# Patient Record
Sex: Female | Born: 1995 | Race: White | Hispanic: No | Marital: Single | Smoking: Never smoker
Health system: Southern US, Community
[De-identification: ages and names within clinical notes are randomized; demographics above are authoritative.]

## PROBLEM LIST (undated history)

## (undated) DIAGNOSIS — F329 Major depressive disorder, single episode, unspecified: Secondary | ICD-10-CM

## (undated) DIAGNOSIS — F32A Depression, unspecified: Secondary | ICD-10-CM

## (undated) DIAGNOSIS — F419 Anxiety disorder, unspecified: Secondary | ICD-10-CM

## (undated) DIAGNOSIS — F319 Bipolar disorder, unspecified: Secondary | ICD-10-CM

## (undated) DIAGNOSIS — R519 Headache, unspecified: Secondary | ICD-10-CM

## (undated) DIAGNOSIS — R51 Headache: Secondary | ICD-10-CM

## (undated) HISTORY — DX: Anxiety disorder, unspecified: F41.9

## (undated) HISTORY — DX: Bipolar disorder, unspecified: F31.9

## (undated) HISTORY — PX: SALIVARY STONE REMOVAL: SHX5213

## (undated) HISTORY — PX: WISDOM TOOTH EXTRACTION: SHX21

## (undated) HISTORY — DX: Headache: R51

## (undated) HISTORY — DX: Headache, unspecified: R51.9

## (undated) HISTORY — DX: Major depressive disorder, single episode, unspecified: F32.9

## (undated) HISTORY — DX: Depression, unspecified: F32.A

## (undated) HISTORY — PX: GUM SURGERY: SHX658

---
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
## 2015-01-15 ENCOUNTER — Ambulatory Visit (INDEPENDENT_AMBULATORY_CARE_PROVIDER_SITE_OTHER): Payer: No Typology Code available for payment source

## 2015-01-15 DIAGNOSIS — Z3042 Encounter for surveillance of injectable contraceptive: Secondary | ICD-10-CM

## 2015-01-15 DIAGNOSIS — Z3049 Encounter for surveillance of other contraceptives: Secondary | ICD-10-CM | POA: Diagnosis not present

## 2015-01-15 MED ORDER — MEDROXYPROGESTERONE ACETATE 150 MG/ML IM SUSP
150.0000 mg | Freq: Once | INTRAMUSCULAR | Status: AC
  Administered 2015-01-15: 150 mg via INTRAMUSCULAR

## 2015-04-08 ENCOUNTER — Ambulatory Visit (INDEPENDENT_AMBULATORY_CARE_PROVIDER_SITE_OTHER): Payer: BLUE CROSS/BLUE SHIELD

## 2015-04-08 DIAGNOSIS — Z3009 Encounter for other general counseling and advice on contraception: Secondary | ICD-10-CM

## 2015-04-08 MED ORDER — MEDROXYPROGESTERONE ACETATE 150 MG/ML IM SUSP
150.0000 mg | Freq: Once | INTRAMUSCULAR | Status: AC
  Administered 2015-04-08: 150 mg via INTRAMUSCULAR

## 2015-05-07 DIAGNOSIS — F419 Anxiety disorder, unspecified: Secondary | ICD-10-CM | POA: Insufficient documentation

## 2015-05-07 DIAGNOSIS — F329 Major depressive disorder, single episode, unspecified: Secondary | ICD-10-CM | POA: Insufficient documentation

## 2015-05-07 DIAGNOSIS — F314 Bipolar disorder, current episode depressed, severe, without psychotic features: Secondary | ICD-10-CM | POA: Insufficient documentation

## 2015-07-01 ENCOUNTER — Encounter: Payer: BLUE CROSS/BLUE SHIELD | Admitting: Family Medicine

## 2015-07-01 ENCOUNTER — Ambulatory Visit: Payer: BLUE CROSS/BLUE SHIELD

## 2015-07-31 ENCOUNTER — Encounter: Payer: Self-pay | Admitting: Family Medicine

## 2015-07-31 ENCOUNTER — Ambulatory Visit (INDEPENDENT_AMBULATORY_CARE_PROVIDER_SITE_OTHER): Payer: BLUE CROSS/BLUE SHIELD | Admitting: Family Medicine

## 2015-07-31 VITALS — BP 109/77 | HR 98 | Temp 97.9°F | Ht 62.0 in | Wt 104.0 lb

## 2015-07-31 DIAGNOSIS — Z308 Encounter for other contraceptive management: Secondary | ICD-10-CM | POA: Diagnosis not present

## 2015-07-31 DIAGNOSIS — Z23 Encounter for immunization: Secondary | ICD-10-CM | POA: Diagnosis not present

## 2015-07-31 DIAGNOSIS — Z3042 Encounter for surveillance of injectable contraceptive: Secondary | ICD-10-CM

## 2015-07-31 DIAGNOSIS — Z0000 Encounter for general adult medical examination without abnormal findings: Secondary | ICD-10-CM

## 2015-07-31 DIAGNOSIS — Z114 Encounter for screening for human immunodeficiency virus [HIV]: Secondary | ICD-10-CM

## 2015-07-31 LAB — PREGNANCY, URINE: PREG TEST UR: NEGATIVE

## 2015-07-31 MED ORDER — MEDROXYPROGESTERONE ACETATE 150 MG/ML IM SUSP
150.0000 mg | Freq: Once | INTRAMUSCULAR | Status: AC
  Administered 2015-07-31: 150 mg via INTRAMUSCULAR

## 2015-07-31 NOTE — Assessment & Plan Note (Signed)
Overdue for injection, so pregnancy test done today; negative; Depo 150 mg given IM times one here; she may return in 13 weeks (I will not be here at that time, so new orders will need to be entered by provider)

## 2015-07-31 NOTE — Assessment & Plan Note (Signed)
USPSTF grade A and B recommendations reviewed with patient; age-appropriate recommendations, preventive care, screening tests, etc discussed and encouraged; healthy living encouraged; see AVS for patient education given to patient; pap smear at age 20; she'll return for labs this week or next; she had a commitment this morning and had limited tiime

## 2015-07-31 NOTE — Patient Instructions (Addendum)
Check with your family to see if any strong history of breast and/or ovarian cancer  You received the flu shot today; it should protect you against the flu virus over the coming months; it will take about two weeks for antibodies to develop; do try to stay away from hospitals, nursing homes, and daycares during peak flu season; taking extra vitamin C daily during flu season may help you avoid getting sick  You received the DEPO shot today; return in 13 weeks for your next injection  Return in the next week or two for fasting labs  Return in one year for your next physical   Health Maintenance, Female Adopting a healthy lifestyle and getting preventive care can go a long way to promote health and wellness. Talk with your health care provider about what schedule of regular examinations is right for you. This is a good chance for you to check in with your provider about disease prevention and staying healthy. In between checkups, there are plenty of things you can do on your own. Experts have done a lot of research about which lifestyle changes and preventive measures are most likely to keep you healthy. Ask your health care provider for more information. WEIGHT AND DIET  Eat a healthy diet  Be sure to include plenty of vegetables, fruits, low-fat dairy products, and lean protein.  Do not eat a lot of foods high in solid fats, added sugars, or salt.  Get regular exercise. This is one of the most important things you can do for your health.  Most adults should exercise for at least 150 minutes each week. The exercise should increase your heart rate and make you sweat (moderate-intensity exercise).  Most adults should also do strengthening exercises at least twice a week. This is in addition to the moderate-intensity exercise.  Maintain a healthy weight  Body mass index (BMI) is a measurement that can be used to identify possible weight problems. It estimates body fat based on height and  weight. Your health care provider can help determine your BMI and help you achieve or maintain a healthy weight.  For females 55 years of age and older:   A BMI below 18.5 is considered underweight.  A BMI of 18.5 to 24.9 is normal.  A BMI of 25 to 29.9 is considered overweight.  A BMI of 30 and above is considered obese.  Watch levels of cholesterol and blood lipids  You should start having your blood tested for lipids and cholesterol at 20 years of age, then have this test every 5 years.  You may need to have your cholesterol levels checked more often if:  Your lipid or cholesterol levels are high.  You are older than 20 years of age.  You are at high risk for heart disease.  CANCER SCREENING   Lung Cancer  Lung cancer screening is recommended for adults 59-18 years old who are at high risk for lung cancer because of a history of smoking.  A yearly low-dose CT scan of the lungs is recommended for people who:  Currently smoke.  Have quit within the past 15 years.  Have at least a 30-pack-year history of smoking. A pack year is smoking an average of one pack of cigarettes a day for 1 year.  Yearly screening should continue until it has been 15 years since you quit.  Yearly screening should stop if you develop a health problem that would prevent you from having lung cancer treatment.  Breast Cancer  Practice breast self-awareness. This means understanding how your breasts normally appear and feel.  It also means doing regular breast self-exams. Let your health care provider know about any changes, no matter how small.  If you are in your 20s or 30s, you should have a clinical breast exam (CBE) by a health care provider every 1-3 years as part of a regular health exam.  If you are 62 or older, have a CBE every year. Also consider having a breast X-ray (mammogram) every year.  If you have a family history of breast cancer, talk to your health care provider about  genetic screening.  If you are at high risk for breast cancer, talk to your health care provider about having an MRI and a mammogram every year.  Breast cancer gene (BRCA) assessment is recommended for women who have family members with BRCA-related cancers. BRCA-related cancers include:  Breast.  Ovarian.  Tubal.  Peritoneal cancers.  Results of the assessment will determine the need for genetic counseling and BRCA1 and BRCA2 testing. Cervical Cancer Your health care provider may recommend that you be screened regularly for cancer of the pelvic organs (ovaries, uterus, and vagina). This screening involves a pelvic examination, including checking for microscopic changes to the surface of your cervix (Pap test). You may be encouraged to have this screening done every 3 years, beginning at age 61.  For women ages 25-65, health care providers may recommend pelvic exams and Pap testing every 3 years, or they may recommend the Pap and pelvic exam, combined with testing for human papilloma virus (HPV), every 5 years. Some types of HPV increase your risk of cervical cancer. Testing for HPV may also be done on women of any age with unclear Pap test results.  Other health care providers may not recommend any screening for nonpregnant women who are considered low risk for pelvic cancer and who do not have symptoms. Ask your health care provider if a screening pelvic exam is right for you.  If you have had past treatment for cervical cancer or a condition that could lead to cancer, you need Pap tests and screening for cancer for at least 20 years after your treatment. If Pap tests have been discontinued, your risk factors (such as having a new sexual partner) need to be reassessed to determine if screening should resume. Some women have medical problems that increase the chance of getting cervical cancer. In these cases, your health care provider may recommend more frequent screening and Pap  tests. Colorectal Cancer  This type of cancer can be detected and often prevented.  Routine colorectal cancer screening usually begins at 20 years of age and continues through 20 years of age.  Your health care provider may recommend screening at an earlier age if you have risk factors for colon cancer.  Your health care provider may also recommend using home test kits to check for hidden blood in the stool.  A small camera at the end of a tube can be used to examine your colon directly (sigmoidoscopy or colonoscopy). This is done to check for the earliest forms of colorectal cancer.  Routine screening usually begins at age 33.  Direct examination of the colon should be repeated every 5-10 years through 20 years of age. However, you may need to be screened more often if early forms of precancerous polyps or small growths are found. Skin Cancer  Check your skin from head to toe regularly.  Tell your health care provider about any  new moles or changes in moles, especially if there is a change in a mole's shape or color.  Also tell your health care provider if you have a mole that is larger than the size of a pencil eraser.  Always use sunscreen. Apply sunscreen liberally and repeatedly throughout the day.  Protect yourself by wearing long sleeves, pants, a wide-brimmed hat, and sunglasses whenever you are outside. HEART DISEASE, DIABETES, AND HIGH BLOOD PRESSURE   High blood pressure causes heart disease and increases the risk of stroke. High blood pressure is more likely to develop in:  People who have blood pressure in the high end of the normal range (130-139/85-89 mm Hg).  People who are overweight or obese.  People who are African American.  If you are 23-3 years of age, have your blood pressure checked every 3-5 years. If you are 66 years of age or older, have your blood pressure checked every year. You should have your blood pressure measured twice--once when you are at a  hospital or clinic, and once when you are not at a hospital or clinic. Record the average of the two measurements. To check your blood pressure when you are not at a hospital or clinic, you can use:  An automated blood pressure machine at a pharmacy.  A home blood pressure monitor.  If you are between 16 years and 33 years old, ask your health care provider if you should take aspirin to prevent strokes.  Have regular diabetes screenings. This involves taking a blood sample to check your fasting blood sugar level.  If you are at a normal weight and have a low risk for diabetes, have this test once every three years after 20 years of age.  If you are overweight and have a high risk for diabetes, consider being tested at a younger age or more often. PREVENTING INFECTION  Hepatitis B  If you have a higher risk for hepatitis B, you should be screened for this virus. You are considered at high risk for hepatitis B if:  You were born in a country where hepatitis B is common. Ask your health care provider which countries are considered high risk.  Your parents were born in a high-risk country, and you have not been immunized against hepatitis B (hepatitis B vaccine).  You have HIV or AIDS.  You use needles to inject street drugs.  You live with someone who has hepatitis B.  You have had sex with someone who has hepatitis B.  You get hemodialysis treatment.  You take certain medicines for conditions, including cancer, organ transplantation, and autoimmune conditions. Hepatitis C  Blood testing is recommended for:  Everyone born from [birth year] through 1965.  Anyone with known risk factors for hepatitis C. Sexually transmitted infections (STIs)  You should be screened for sexually transmitted infections (STIs) including gonorrhea and chlamydia if:  You are sexually active and are younger than 20 years of age.  You are older than 20 years of age and your health care provider tells you  that you are at risk for this type of infection.  Your sexual activity has changed since you were last screened and you are at an increased risk for chlamydia or gonorrhea. Ask your health care provider if you are at risk.  If you do not have HIV, but are at risk, it may be recommended that you take a prescription medicine daily to prevent HIV infection. This is called pre-exposure prophylaxis (PrEP). You are considered at risk  if:  You are sexually active and do not regularly use condoms or know the HIV status of your partner(s).  You take drugs by injection.  You are sexually active with a partner who has HIV. Talk with your health care provider about whether you are at high risk of being infected with HIV. If you choose to begin PrEP, you should first be tested for HIV. You should then be tested every 3 months for as long as you are taking PrEP.  PREGNANCY   If you are premenopausal and you may become pregnant, ask your health care provider about preconception counseling.  If you may become pregnant, take 400 to 800 micrograms (mcg) of folic acid every day.  If you want to prevent pregnancy, talk to your health care provider about birth control (contraception). OSTEOPOROSIS AND MENOPAUSE   Osteoporosis is a disease in which the bones lose minerals and strength with aging. This can result in serious bone fractures. Your risk for osteoporosis can be identified using a bone density scan.  If you are 57 years of age or older, or if you are at risk for osteoporosis and fractures, ask your health care provider if you should be screened.  Ask your health care provider whether you should take a calcium or vitamin D supplement to lower your risk for osteoporosis.  Menopause may have certain physical symptoms and risks.  Hormone replacement therapy may reduce some of these symptoms and risks. Talk to your health care provider about whether hormone replacement therapy is right for you.  HOME  CARE INSTRUCTIONS   Schedule regular health, dental, and eye exams.  Stay current with your immunizations.   Do not use any tobacco products including cigarettes, chewing tobacco, or electronic cigarettes.  If you are pregnant, do not drink alcohol.  If you are breastfeeding, limit how much and how often you drink alcohol.  Limit alcohol intake to no more than 1 drink per day for nonpregnant women. One drink equals 12 ounces of beer, 5 ounces of wine, or 1 ounces of hard liquor.  Do not use street drugs.  Do not share needles.  Ask your health care provider for help if you need support or information about quitting drugs.  Tell your health care provider if you often feel depressed.  Tell your health care provider if you have ever been abused or do not feel safe at home.   This information is not intended to replace advice given to you by your health care provider. Make sure you discuss any questions you have with your health care provider.   Document Released: 12/22/2010 Document Revised: 06/29/2014 Document Reviewed: 05/10/2013 Elsevier Interactive Patient Education [employer].

## 2015-07-31 NOTE — Assessment & Plan Note (Signed)
One-time hiv testing recommended; she'll return for labs when she can come back when she has more time

## 2015-07-31 NOTE — Progress Notes (Signed)
Patient ID: Misty Peters, female   DOB: May 21, 1996, 20 y.o.   MRN: [id number]  Subjective:   Misty Peters is a 20 y.o. female here for a complete physical exam  Interim issues since last visit: No unprotected sex; doing well on Depo; no headaches or unintended weight gain  USPSTF grade A and B recommendations Alcohol: no Depression:  Depression screen [facility] 2/9 07/31/2015  Decreased Interest 1  Down, Depressed, Hopeless 1  PHQ - 2 Score 2  Hypertension: controlled Obesity: no Tobacco use: no HIV, hep B, hep C: hiv testing only, will come back STD testing and prevention (chl/gon/syphilis): no sx Lipids: declined Glucose: declined BRCA gene screening: mother side has breast cancer, maybe more than one person Intimate partner violence: no Cervical cancer screening: at age 69 Diet: better than typical American, not 100% healthy; no bacon or sausage Exercise: occasional, runs on treadmill Skin cancer: no worrisome moles  Past Medical History  Diagnosis Date  . Depression   . Anxiety   . Headache    History reviewed. No pertinent past surgical history. Family History  Problem Relation Age of Onset  . Hyperlipidemia Father   . Hypertension Father   . Diabetes Father   . Cancer Paternal Grandmother     lung  . Heart disease Paternal Grandmother   . Stroke Paternal Grandmother   . COPD Neg Hx    Social History  Substance Use Topics  . Smoking status: Never Smoker   . Smokeless tobacco: Never Used  . Alcohol Use: No   Review of Systems  Objective:   Filed Vitals:   07/31/15 1008  BP: 109/77  Pulse: 98  Temp: 97.9 F (36.6 C)  Height: _0  (1.575 m)  Weight: 104 lb (47.174 kg)  SpO2: 96%   Body mass index is 19.02 kg/(m^2). Wt Readings from Last 3 Encounters:  07/31/15 104 lb (47.174 kg) (7 %*, Z = -1.47)  09/12/14 102 lb (46.267 kg) (6 %*, Z = -1.56)   * Growth percentiles are based on CDC 2-20 Years data.   Physical Exam  Constitutional: She  appears well-developed and well-nourished.  HENT:  Head: Normocephalic and atraumatic.  Right Ear: Hearing, tympanic membrane, external ear and ear canal normal.  Left Ear: Hearing, tympanic membrane, external ear and ear canal normal.  Eyes: Conjunctivae and EOM are normal. Right eye exhibits no hordeolum. Left eye exhibits no hordeolum. No scleral icterus.  Neck: Carotid bruit is not present. No thyromegaly present.  Cardiovascular: Normal rate, regular rhythm, S1 normal, S2 normal and normal heart sounds.   No extrasystoles are present.  Pulmonary/Chest: Effort normal and breath sounds normal. No respiratory distress.  Abdominal: Soft. Normal appearance and bowel sounds are normal. She exhibits no distension, no abdominal bruit, no pulsatile midline mass and no mass. There is no hepatosplenomegaly. There is no tenderness. No hernia.  Musculoskeletal: Normal range of motion. She exhibits no edema.  Lymphadenopathy:       Head (right side): No submandibular adenopathy present.       Head (left side): No submandibular adenopathy present.    She has no cervical adenopathy.    She has no axillary adenopathy.  Neurological: She is alert. She displays no tremor. No cranial nerve deficit. She exhibits normal muscle tone. Gait normal.  Reflex Scores:      Patellar reflexes are 2+ on the right side and 2+ on the left side. Skin: Skin is warm and dry. No bruising and no ecchymosis  noted. No cyanosis. No pallor.  Psychiatric: She has a normal mood and affect. Her speech is normal and behavior is normal. Thought content normal. Her mood appears not anxious. She does not exhibit a depressed mood.    Assessment/Plan:   Problem List Items Addressed This Visit      Other   Preventative health care - Primary    USPSTF grade A and B recommendations reviewed with patient; age-appropriate recommendations, preventive care, screening tests, etc discussed and encouraged; healthy living encouraged; see AVS  for patient education given to patient; pap smear at age 15; she'll return for labs this week or next; she had a commitment this morning and had limited tiime      Relevant Orders   CBC with Differential/Platelet   Lipid Panel w/o Chol/HDL Ratio   Comprehensive metabolic panel   Family planning, Depo-Provera contraception monitoring/administration    Overdue for injection, so pregnancy test done today; negative; Depo 150 mg given IM times one here; she may return in 13 weeks (I will not be here at that time, so new orders will need to be entered by provider)      Relevant Medications   medroxyPROGESTERone (DEPO-PROVERA) injection 150 mg (Completed)   Other Relevant Orders   Pregnancy, urine (Completed)   Screening for HIV (human immunodeficiency virus)    One-time hiv testing recommended; she'll return for labs when she can come back when she has more time      Relevant Orders   HIV antibody    Other Visit Diagnoses    Needs flu shot        flu vaccine offered and given today        Meds ordered this encounter  Medications  . medroxyPROGESTERone (DEPO-PROVERA) injection 150 mg    Sig:    Orders Placed This Encounter  Procedures  . Flu Vaccine QUAD 36+ mos IM  . Pregnancy, urine  . HIV antibody    Standing Status: Future     Number of Occurrences:      Standing Expiration Date: 09/19/2015  . CBC with Differential/Platelet    Standing Status: Future     Number of Occurrences:      Standing Expiration Date: 09/19/2015  . Lipid Panel w/o Chol/HDL Ratio    Standing Status: Future     Number of Occurrences:      Standing Expiration Date: 09/19/2015    Order Specific Question:  Has the patient fasted?    Answer:  Yes  . Comprehensive metabolic panel    Standing Status: Future     Number of Occurrences:      Standing Expiration Date: 09/19/2015    Order Specific Question:  Has the patient fasted?    Answer:  Yes   Follow up plan: Return in about 13 weeks (around  10/30/2015) for next Depo; 1-2 weeks for fasting labs; 1 year for CPE.  An after-visit summary was printed and given to the patient at [facility].  Please see the patient instructions which may contain other information and recommendations beyond what is mentioned above in the assessment and plan.  Orders Placed This Encounter  Procedures  . Flu Vaccine QUAD 36+ mos IM  . Pregnancy, urine  . HIV antibody  . CBC with Differential/Platelet  . Lipid Panel w/o Chol/HDL Ratio  . Comprehensive metabolic panel

## 2015-10-27 ENCOUNTER — Telehealth: Payer: Self-pay | Admitting: Family Medicine

## 2015-10-27 NOTE — Telephone Encounter (Signed)
Please remind patient to have the labs done that were ordered in Feb at her physical; this week would be great if she can do it Thank you

## 2015-11-07 ENCOUNTER — Encounter: Payer: Self-pay | Admitting: Family Medicine

## 2015-11-07 ENCOUNTER — Ambulatory Visit (INDEPENDENT_AMBULATORY_CARE_PROVIDER_SITE_OTHER): Payer: BLUE CROSS/BLUE SHIELD | Admitting: Family Medicine

## 2015-11-07 ENCOUNTER — Other Ambulatory Visit: Payer: Self-pay | Admitting: Family Medicine

## 2015-11-07 VITALS — BP 110/72 | HR 96 | Temp 98.5°F | Resp 12 | Wt 107.0 lb

## 2015-11-07 DIAGNOSIS — Z0000 Encounter for general adult medical examination without abnormal findings: Secondary | ICD-10-CM

## 2015-11-07 DIAGNOSIS — Z114 Encounter for screening for human immunodeficiency virus [HIV]: Secondary | ICD-10-CM

## 2015-11-07 DIAGNOSIS — Z308 Encounter for other contraceptive management: Secondary | ICD-10-CM

## 2015-11-07 DIAGNOSIS — Z113 Encounter for screening for infections with a predominantly sexual mode of transmission: Secondary | ICD-10-CM

## 2015-11-07 DIAGNOSIS — F3130 Bipolar disorder, current episode depressed, mild or moderate severity, unspecified: Secondary | ICD-10-CM | POA: Diagnosis not present

## 2015-11-07 DIAGNOSIS — F319 Bipolar disorder, unspecified: Secondary | ICD-10-CM

## 2015-11-07 DIAGNOSIS — Z3042 Encounter for surveillance of injectable contraceptive: Secondary | ICD-10-CM

## 2015-11-07 HISTORY — DX: Bipolar disorder, unspecified: F31.9

## 2015-11-07 MED ORDER — MEDROXYPROGESTERONE ACETATE 150 MG/ML IM SUSP: INTRAMUSCULAR | Status: DC

## 2015-11-07 NOTE — Assessment & Plan Note (Signed)
Orders entered in Feb; patient will have this drawn today

## 2015-11-07 NOTE — Assessment & Plan Note (Signed)
Discussed what sounds like bipolar d/o with patient; suggested book to her, refer to psychiatrist; reasons to seek help reviewed; crisis line # given, [facility] information given

## 2015-11-07 NOTE — Assessment & Plan Note (Signed)
USPSTF grade A and B recommendations reviewed with patient; age-appropriate recommendations, preventive care, screening tests, etc discussed and encouraged; healthy living encouraged; see AVS for patient education given to patient  
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
## 2015-11-07 NOTE — Progress Notes (Signed)
Patient ID: Misty Peters, female   DOB: 1995-08-23, 20 y.o.   MRN: [id number]   Subjective:   Misty Peters is a 20 y.o. female here for a complete physical exam  USPSTF grade A and B recommendations Alcohol: no Depression:  Depression screen [facility] 2/9 11/07/2015 07/31/2015  Decreased Interest 3 1  Down, Depressed, Hopeless 3 1  PHQ - 2 Score 6 2  Altered sleeping 3 -  Tired, decreased energy 3 -  Change in appetite 3 -  Feeling bad or failure about yourself  0 -  Trouble concentrating 2 -  Moving slowly or fidgety/restless 2 -  Suicidal thoughts 0 -  PHQ-9 Score 19 -  Difficult doing work/chores Somewhat difficult -  no thoughts of self-harm Three years duration; has never talked to anyone or taken medicine Not sure about family hx, but father went through similar year; father has bipolar d/o Hypertension: no Obesity: no Tobacco use: no HIV, hep B, hep C: today STD testing and prevention (chl/gon/syphilis): no testing requested, not sexually active for 1.5 years Lipids: ordered Glucose: ordered Colorectal cancer: no fam hx Breast cancer: paternal grandmother, paternal great-grandmother, maternal GM and GGM, maternal aunt BRCA gene screening: consider Intimate partner violence: no Cervical cancer screening: at 39 Lung cancer: grandmother was a smoker, over 85 Osteoporosis: n/a Fall prevention/vitamin D: supplement and/or sun AAA: n/a Aspirin: n/a Diet: doesn't like cheeseburgers; not many meats, not much fast food; could get more fruits and veggies Exercise: trying  Skin cancer: no worrisome moles  Past Medical History  Diagnosis Date  . Depression   . Anxiety   . Headache   . Bipolar disorder with depression ([place]) 11/07/2015   Past Surgical History  Procedure Laterality Date  . Wisdom tooth extraction    . Gum surgery    . Salivary stone removal     MD updated: Gum grafting; wisdom teeth extracted; sialolith  Family History  Problem Relation Age of  Onset  . Hyperlipidemia Father   . Hypertension Father   . Diabetes Father   . Heart disease Paternal Grandmother   . Stroke Paternal Grandmother   . Cancer Paternal Grandmother     lung and breast  . COPD Neg Hx   . Cancer Maternal Grandmother     breast   Social History  Substance Use Topics  . Smoking status: Never Smoker   . Smokeless tobacco: Never Used  . Alcohol Use: No   Review of Systems  Constitutional: Negative for unexpected weight change.  HENT: Negative for hearing loss.   Eyes: Positive for visual disturbance ("I don't really know"; eyes get blurry in morning when walking 10-15 minutes).  Respiratory: Negative for shortness of breath.   Cardiovascular: Positive for chest pain (dog hit her side; better now).  Gastrointestinal: Positive for abdominal pain (with menstrual cycles, just cramping). Negative for blood in stool.  Endocrine: Positive for polydipsia.       Sometimes maybe sugar dropping; not much appetite or eating, no change  Genitourinary: Negative for hematuria.  Allergic/Immunologic: Negative for food allergies.  Neurological: Positive for light-headedness (when not eating or drinking well).       Not a good sleeper; she'll talk with psychiatrist  Hematological: Does not bruise/bleed easily.  Psychiatric/Behavioral: Negative for hallucinations, confusion, self-injury, decreased concentration and agitation. The patient is nervous/anxious. The patient is not hyperactive.   used to be a cutter but not any more; no bingeing or purging  Objective:   Filed Vitals:  11/07/15 1109  BP: 110/72  Pulse: 96  Temp: 98.5 F (36.9 C)  TempSrc: Oral  Resp: 12  Weight: 107 lb (48.535 kg)  SpO2: 95%  Patient's last menstrual period was 10/28/2015 (approximate).  Body mass index is 19.57 kg/(m^2). Wt Readings from Last 3 Encounters:  11/07/15 107 lb (48.535 kg) (10 %*, Z = -1.25)  07/31/15 104 lb (47.174 kg) (7 %*, Z = -1.47)  09/12/14 102 lb (46.267 kg)  (6 %*, Z = -1.56)   * Growth percentiles are based on CDC 2-20 Years data.   Physical Exam  Constitutional: She appears well-developed and well-nourished.  Small-framed female  HENT:  Head: Normocephalic and atraumatic.  Right Ear: Hearing, tympanic membrane, external ear and ear canal normal.  Left Ear: Hearing, tympanic membrane, external ear and ear canal normal.  Eyes: Conjunctivae and EOM are normal. Right eye exhibits no hordeolum. Left eye exhibits no hordeolum. No scleral icterus.  Neck: Carotid bruit is not present. No thyromegaly present.  Cardiovascular: Normal rate, regular rhythm, S1 normal, S2 normal and normal heart sounds.   No extrasystoles are present.  Pulmonary/Chest: Effort normal and breath sounds normal. No respiratory distress.  Abdominal: Soft. Normal appearance and bowel sounds are normal. She exhibits no distension, no abdominal bruit, no pulsatile midline mass and no mass. There is no hepatosplenomegaly. There is no tenderness. No hernia.  Musculoskeletal: Normal range of motion. She exhibits no edema.  Lymphadenopathy:       Head (right side): No submandibular adenopathy present.       Head (left side): No submandibular adenopathy present.    She has no cervical adenopathy.    She has no axillary adenopathy.  Neurological: She is alert. She displays no tremor. No cranial nerve deficit. She exhibits normal muscle tone. Gait normal.  Reflex Scores:      Patellar reflexes are 2+ on the right side and 2+ on the left side. Skin: Skin is warm and dry. No bruising, no ecchymosis and no rash noted. She is not diaphoretic. No cyanosis. No pallor.  Psychiatric: She has a normal mood and affect. Her speech is normal and behavior is normal. Judgment and thought content normal. Her mood appears not anxious. She does not exhibit a depressed mood.  Good eye contact with examiner; does not appear depressed, not tearful, not despondent   Assessment/Plan:   Problem List  Items Addressed This Visit      Other   Bipolar disorder with depression ([place])    Discussed what sounds like bipolar d/o with patient; suggested book to her, refer to psychiatrist; reasons to seek help reviewed; crisis line # given, [facility] information given      Relevant Orders   Ambulatory referral to Psychiatry   Family planning, Depo-Provera contraception monitoring/administration    Patient wishes to start depo; see AVS; rx written; patient to pick up and bring in for injection here at the clinic every 13 weeks; first injection to be within FIVE days of start of next menstrual period      Preventative health care - Primary    USPSTF grade A and B recommendations reviewed with patient; age-appropriate recommendations, preventive care, screening tests, etc discussed and encouraged; healthy living encouraged; see AVS for patient education given to patient      Screening for HIV (human immunodeficiency virus)    Orders entered in Feb; patient will have this drawn today       Other Visit Diagnoses    Routine screening for  STI (sexually transmitted infection)        Relevant Orders    Hepatitis, Acute       Meds ordered this encounter  Medications  . DISCONTD: medroxyPROGESTERone (DEPO-PROVERA) 150 MG/ML injection    Sig: Inject 150 mg into the muscle every 3 (three) months.  . medroxyPROGESTERone (DEPO-PROVERA) 150 MG/ML injection    Sig: Give injection (at doctor's office) into the muscle every 13 weeks    Dispense:  1 mL    Refill:  3   Orders Placed This Encounter  Procedures  . Hepatitis, Acute  . Ambulatory referral to Psychiatry    Referral Priority:  High    Referral Type:  Psychiatric    Referral Reason:  Specialty Services Required    Requested Specialty:  Psychiatry    Number of Visits Requested:  1   Follow up plan: Return in about 1 year (around 11/06/2016) for complete physical; DEPO injection within 5 days after you start your next period.  An after-visit  summary was printed and given to the patient at [place].  Please see the patient instructions which may contain other information and recommendations beyond what is mentioned above in the assessment and plan.

## 2015-11-07 NOTE — Progress Notes (Signed)
   BP 110/72 mmHg  Pulse 96  Temp(Src) 98.5 F (36.9 C) (Oral)  Resp 12  Wt 107 lb (48.535 kg)  SpO2 95%  LMP 10/28/2015 (Approximate)   Subjective:    Patient ID: Misty Peters, female    DOB: 07/02/1995, 20 y.o.   MRN: [id number]  HPI: Misty Skeeternna Marie Novinger is a 20 y.o. female  Chief Complaint  Patient presents with  . Annual Exam  . Contraception    wants to see about getting back n depo    [facility] 07/31/2015  Decreased Interest 3 1  Down, Depressed, Hopeless 3 1  PHQ - 2 Score 6 2  Altered sleeping 3 -  Tired, decreased energy 3 -  Change in appetite 3 -  Feeling bad or failure about yourself  0 -  Trouble concentrating 2 -  Moving slowly or fidgety/restless 2 -  Suicidal thoughts 0 -  PHQ-9 Score 19 -  Difficult doing work/chores Somewhat difficult -    No flowsheet data found.  Relevant past medical, surgical, family and social history reviewed Past Medical History  Diagnosis Date  . Depression   . Anxiety   . Headache    No past surgical history on file. Family History  Problem Relation Age of Onset  . Hyperlipidemia Father   . Hypertension Father   . Diabetes Father   . Cancer Paternal Grandmother     lung  . Heart disease Paternal Grandmother   . Stroke Paternal Grandmother   . COPD Neg Hx    Social History  Substance Use Topics  . Smoking status: Never Smoker   . Smokeless tobacco: Never Used  . Alcohol Use: No    Interim medical history since last visit reviewed. Allergies and medications reviewed  Review of Systems Per HPI unless specifically indicated above     Objective:    BP 110/72 mmHg  Pulse 96  Temp(Src) 98.5 F (36.9 C) (Oral)  Resp 12  Wt 107 lb (48.535 kg)  SpO2 95%  LMP 10/28/2015 (Approximate)  Wt Readings from Last 3 Encounters:  11/07/15 107 lb (48.535 kg) (10 %*, Z = -1.25)  07/31/15 104 lb (47.174 kg) (7 %*, Z = -1.47)  09/12/14 102 lb (46.267 kg) (6 %*, Z = -1.56)   * Growth  percentiles are based on CDC 2-20 Years data.    Physical Exam  Results for orders placed or performed in visit on 07/31/15  Pregnancy, urine  Result Value Ref Range   Preg Test, Ur Negative Negative      Assessment & Plan:   Problem List Items Addressed This Visit    None       Follow up plan: No Follow-up on file.  An after-visit summary was printed and given to the patient at check-out.  Please see the patient instructions which may contain other information and recommendations beyond what is mentioned above in the assessment and plan.  Meds ordered this encounter  Medications  . medroxyPROGESTERone (DEPO-PROVERA) 150 MG/ML injection    Sig: Inject 150 mg into the muscle every 3 (three) months.    No orders of the defined types were placed in this encounter.

## 2015-11-07 NOTE — Assessment & Plan Note (Signed)
Patient wishes to start depo; see AVS; rx written; patient to pick up and bring in for injection here at the clinic every 13 weeks; first injection to be within FIVE days of start of next menstrual period

## 2015-11-08 LAB — CBC WITH DIFFERENTIAL/PLATELET
BASOS: 1 %
Basophils Absolute: 0.1 10*3/uL (ref 0.0–0.2)
EOS (ABSOLUTE): 0.1 10*3/uL (ref 0.0–0.4)
Eos: 2 %
HEMATOCRIT: 39.5 % (ref 34.0–46.6)
Hemoglobin: 13.3 g/dL (ref 11.1–15.9)
IMMATURE GRANS (ABS): 0 10*3/uL (ref 0.0–0.1)
Immature Granulocytes: 0 %
LYMPHS ABS: 3 10*3/uL (ref 0.7–3.1)
LYMPHS: 37 %
MCH: 29 pg (ref 26.6–33.0)
MCHC: 33.7 g/dL (ref 31.5–35.7)
MCV: 86 fL (ref 79–97)
MONOCYTES: 8 %
Monocytes Absolute: 0.6 10*3/uL (ref 0.1–0.9)
NEUTROS ABS: 4.3 10*3/uL (ref 1.4–7.0)
Neutrophils: 52 %
Platelets: 356 10*3/uL (ref 150–379)
RBC: 4.59 x10E6/uL (ref 3.77–5.28)
RDW: 13.3 % (ref 12.3–15.4)
WBC: 8.1 10*3/uL (ref 3.4–10.8)

## 2015-11-08 LAB — HEPATITIS PANEL, ACUTE
Hep A IgM: NEGATIVE
Hep B C IgM: NEGATIVE
Hepatitis B Surface Ag: NEGATIVE

## 2015-11-08 LAB — COMPREHENSIVE METABOLIC PANEL
A/G RATIO: 1.8 (ref 1.2–2.2)
ALBUMIN: 4.9 g/dL (ref 3.5–5.5)
ALT: 21 IU/L (ref 0–32)
AST: 24 IU/L (ref 0–40)
Alkaline Phosphatase: 71 IU/L (ref 39–117)
BUN/Creatinine Ratio: 13 (ref 9–23)
BUN: 10 mg/dL (ref 6–20)
Bilirubin Total: 0.3 mg/dL (ref 0.0–1.2)
CALCIUM: 9.7 mg/dL (ref 8.7–10.2)
CHLORIDE: 102 mmol/L (ref 96–106)
CO2: 23 mmol/L (ref 18–29)
Creatinine, Ser: 0.8 mg/dL (ref 0.57–1.00)
GFR calc Af Amer: 124 mL/min/{1.73_m2} (ref >59)
GFR calc non Af Amer: 107 mL/min/{1.73_m2} (ref >59)
Globulin, Total: 2.7 g/dL (ref 1.5–4.5)
Glucose: 90 mg/dL (ref 65–99)
POTASSIUM: 4.2 mmol/L (ref 3.5–5.2)
SODIUM: 141 mmol/L (ref 134–144)
TOTAL PROTEIN: 7.6 g/dL (ref 6.0–8.5)

## 2015-11-08 LAB — LIPID PANEL W/O CHOL/HDL RATIO
CHOLESTEROL TOTAL: 133 mg/dL (ref 100–169)
HDL: 67 mg/dL (ref >39)
LDL Calculated: 56 mg/dL (ref 0–109)
TRIGLYCERIDES: 52 mg/dL (ref 0–89)
VLDL Cholesterol Cal: 10 mg/dL (ref 5–40)

## 2015-11-08 LAB — HIV ANTIBODY (ROUTINE TESTING W REFLEX): HIV SCREEN 4TH GENERATION: NONREACTIVE

## 2015-12-26 ENCOUNTER — Ambulatory Visit: Payer: BLUE CROSS/BLUE SHIELD

## 2016-01-16 ENCOUNTER — Ambulatory Visit (INDEPENDENT_AMBULATORY_CARE_PROVIDER_SITE_OTHER): Payer: BLUE CROSS/BLUE SHIELD

## 2016-01-16 DIAGNOSIS — Z3042 Encounter for surveillance of injectable contraceptive: Secondary | ICD-10-CM | POA: Diagnosis not present

## 2016-01-16 MED ORDER — MEDROXYPROGESTERONE ACETATE 150 MG/ML IM SUSP
150.0000 mg | Freq: Once | INTRAMUSCULAR | Status: AC
  Administered 2016-01-16: 150 mg via INTRAMUSCULAR

## 2016-05-12 ENCOUNTER — Ambulatory Visit: Payer: BLUE CROSS/BLUE SHIELD | Admitting: Family Medicine

## 2016-11-10 ENCOUNTER — Encounter: Payer: Self-pay | Admitting: Family Medicine

## 2016-11-10 ENCOUNTER — Ambulatory Visit (INDEPENDENT_AMBULATORY_CARE_PROVIDER_SITE_OTHER): Payer: BLUE CROSS/BLUE SHIELD | Admitting: Family Medicine

## 2016-11-10 VITALS — BP 112/74 | HR 91 | Temp 98.5°F | Resp 14 | Wt 124.9 lb

## 2016-11-10 DIAGNOSIS — H538 Other visual disturbances: Secondary | ICD-10-CM

## 2016-11-10 DIAGNOSIS — H5712 Ocular pain, left eye: Secondary | ICD-10-CM

## 2016-11-10 DIAGNOSIS — Z30011 Encounter for initial prescription of contraceptive pills: Secondary | ICD-10-CM | POA: Diagnosis not present

## 2016-11-10 MED ORDER — NORGESTIM-ETH ESTRAD TRIPHASIC 0.18/0.215/0.25 MG-25 MCG PO TABS: 1.0000 | ORAL_TABLET | Freq: Every day | ORAL | 11 refills | Status: DC

## 2016-11-10 NOTE — Patient Instructions (Addendum)
I've put in a referral to an eye doctor If you have not heard anything from my staff in by Friday about any orders/referrals/studies from today, please contact us here to follow-up [phone]  Return after your 21st birthday for a complete physical and pap smear   Oral Contraception Information Oral contraceptive pills (OCPs) are medicines taken to prevent pregnancy. OCPs work by preventing the ovaries from releasing eggs. The hormones in OCPs also cause the cervical mucus to thicken, preventing the sperm from entering the uterus. The hormones also cause the uterine lining to become thin, not allowing a fertilized egg to attach to the inside of the uterus. OCPs are highly effective when taken exactly as prescribed. However, OCPs do not prevent sexually transmitted diseases (STDs). Safe sex practices, such as using condoms along with the pill, can help prevent STDs. Before taking the pill, you may have a physical exam and Pap test. Your health care provider may order blood tests. The health care provider will make sure you are a good candidate for oral contraception. Discuss with your health care provider the possible side effects of the OCP you may be prescribed. When starting an OCP, it can take 2 to 3 months for the body to adjust to the changes in hormone levels in your body. Types of oral contraception  The combination pill-This pill contains estrogen and progestin (synthetic progesterone) hormones. The combination pill comes in 21-day, 28-day, or 91-day packs. Some types of combination pills are meant to be taken continuously (365-day pills). With 21-day packs, you do not take pills for 7 days after the last pill. With 28-day packs, the pill is taken every day. The last 7 pills are without hormones. Certain types of pills have more than 21 hormone-containing pills. With 91-day packs, the first 84 pills contain both hormones, and the last 7 pills contain no hormones or contain estrogen only.  The  minipill-This pill contains the progesterone hormone only. The pill is taken every day continuously. It is very important to take the pill at the same time each day. The minipill comes in packs of 28 pills. All 28 pills contain the hormone. Advantages of oral contraceptive pills  Decreases premenstrual symptoms.  Treats menstrual period cramps.  Regulates the menstrual cycle.  Decreases a heavy menstrual flow.  May treatacne, depending on the type of pill.  Treats abnormal uterine bleeding.  Treats polycystic ovarian syndrome.  Treats endometriosis.  Can be used as emergency contraception. Things that can make oral contraceptive pills less effective OCPs can be less effective if:  You forget to take the pill at the same time every day.  You have a stomach or intestinal disease that lessens the absorption of the pill.  You take OCPs with other medicines that make OCPs less effective, such as antibiotics, certain HIV medicines, and some seizure medicines.  You take expired OCPs.  You forget to restart the pill on day 7, when using the packs of 21 pills. Risks associated with oral contraceptive pills Oral contraceptive pills can sometimes cause side effects, such as:  Headache.  Nausea.  Breast tenderness.  Irregular bleeding or spotting. Combination pills are also associated with a small increased risk of:  Blood clots.  Heart attack.  Stroke. This information is not intended to replace advice given to you by your health care provider. Make sure you discuss any questions you have with your health care provider. Document Released: 08/29/2002 Document Revised: 11/14/2015 Document Reviewed: 11/27/2012 Elsevier Interactive Patient Education  2017 [employer] Inc[employer]  

## 2016-11-10 NOTE — Progress Notes (Signed)
BP 112/74 (BP Location: Left Arm, Patient Position: Sitting, Cuff Size: Normal)   Pulse 91   Temp 98.5 F (36.9 C) (Oral)   Resp 14   Wt 124 lb 14.4 oz (56.7 kg)   LMP 11/06/2016   SpO2 98%   BMI 22.84 kg/m    Subjective:    Patient ID: Misty Peters, female    DOB: Jun 04, 1996, 21 y.o.   MRN: [id number]  HPI: Misty Peters is a 21 y.o. female  Chief Complaint  Patient presents with  . Contraception    Discuss birth control   . Referral    Eye doctor     HPI  She will be turning 21 years old in a few weeks and will return for a complete physical and pap smear Previously took birth control pills and wants to go back on those Had trouble remembering to take them regularly when younger, now thinks she could do better Would rather take a pill than a shot No hx of DVT, PE Both great-grandmothers on mother's side had blood clots; mother never had a blood clot Nonsmoker No migraines with aura  She is interested in seeing an eye doctor; when driving at night, she can't see anything; has night classes, but gets out at 10 pm; does get extreme pains in her left eye and then blurred vision for a while; reads a lot; reads on her phone a lot; no injury; no contacts or glasses  Relevant past medical, surgical, family and social history reviewed Past Medical History:  Diagnosis Date  . Anxiety   . Bipolar disorder with depression ([facility]) 11/07/2015  . Depression   . Headache    Past Surgical History:  Procedure Laterality Date  . GUM SURGERY    . SALIVARY STONE REMOVAL    . WISDOM TOOTH EXTRACTION     Family History  Problem Relation Age of Onset  . Hyperlipidemia Father   . Hypertension Father   . Diabetes Father   . Heart disease Paternal Grandmother   . Stroke Paternal Grandmother   . Cancer Paternal Grandmother        lung and breast  . Cancer Maternal Grandmother        breast  . COPD Neg Hx    Social History   Social History  . Marital status: Single    Spouse name: N/A  . Number of children: N/A  . Years of education: N/A   Occupational History  . Not on file.   Social History Main Topics  . Smoking status: Never Smoker  . Smokeless tobacco: Never Used  . Alcohol use No  . Drug use: No  . Sexual activity: Yes   Other Topics Concern  . Not on file   Social History Narrative  . No narrative on file    Interim medical history since last visit reviewed. Allergies and medications reviewed  Review of Systems Per HPI unless specifically indicated above     Objective:    BP 112/74 (BP Location: Left Arm, Patient Position: Sitting, Cuff Size: Normal)   Pulse 91   Temp 98.5 F (36.9 C) (Oral)   Resp 14   Wt 124 lb 14.4 oz (56.7 kg)   LMP 11/06/2016   SpO2 98%   BMI 22.84 kg/m   Wt Readings from Last 3 Encounters:  11/10/16 124 lb 14.4 oz (56.7 kg)  11/07/15 107 lb (48.5 kg) (10 %, Z= -1.25)*  07/31/15 104 lb (47.2 kg) (7 %, Z= -  1.47)*   * Growth percentiles are based on CDC 2-20 Years data.    Physical Exam  Constitutional: She appears well-developed and well-nourished. No distress.  HENT:  Mouth/Throat: Mucous membranes are normal.  Eyes: EOM are normal. No scleral icterus.  Cardiovascular: Normal rate and regular rhythm.   Pulmonary/Chest: Effort normal and breath sounds normal.  Psychiatric: She has a normal mood and affect. Her behavior is normal.       Assessment & Plan:   Problem List Items Addressed This Visit      Other   Encounter for BCP (birth control pills) initial prescription    Discussed risk of OCPs, including stroke, heart attack, DVT, PE; she wishes to start; Rx given; she will return for physical and pap smear when she turns 21 next month       Other Visit Diagnoses    Eye pain, left    -  Primary   Relevant Orders   Ambulatory referral to Ophthalmology   Blurred vision, left eye       Relevant Orders   Ambulatory referral to Ophthalmology       Follow up plan: Return in about  4 weeks (around 12/08/2016) for complete physical.  An after-visit summary was printed and given to the patient at check-out.  Please see the patient instructions which may contain other information and recommendations beyond what is mentioned above in the assessment and plan.  Meds ordered this encounter  Medications  . Norgestimate-Ethinyl Estradiol Triphasic (ORTHO TRI-CYCLEN LO) 0.18/0.215/0.25 MG-25 MCG tab    Sig: Take 1 tablet by mouth daily.    Dispense:  1 Package    Refill:  11    Call me if another OCP preferred with insurance    Orders Placed This Encounter  Procedures  . Ambulatory referral to Ophthalmology

## 2016-11-15 DIAGNOSIS — Z30011 Encounter for initial prescription of contraceptive pills: Secondary | ICD-10-CM | POA: Insufficient documentation

## 2016-11-15 NOTE — Assessment & Plan Note (Signed)
Discussed risk of OCPs, including stroke, heart attack, DVT, PE; she wishes to start; Rx given; she will return for physical and pap smear when she turns 21 next month

## 2016-12-18 ENCOUNTER — Ambulatory Visit: Payer: BLUE CROSS/BLUE SHIELD | Admitting: Family Medicine

## 2018-02-07 ENCOUNTER — Ambulatory Visit (INDEPENDENT_AMBULATORY_CARE_PROVIDER_SITE_OTHER): Payer: 59 | Admitting: Family Medicine

## 2018-02-07 ENCOUNTER — Other Ambulatory Visit (HOSPITAL_COMMUNITY)
Admission: RE | Admit: 2018-02-07 | Discharge: 2018-02-07 | Disposition: A | Discharge status: Home / Self Care | Payer: 59 | Source: Ambulatory Visit | Attending: Family Medicine | Admitting: Family Medicine

## 2018-02-07 ENCOUNTER — Encounter: Payer: Self-pay | Admitting: Family Medicine

## 2018-02-07 VITALS — BP 116/72 | HR 93 | Temp 98.5°F | Ht 60.38 in | Wt 136.6 lb

## 2018-02-07 DIAGNOSIS — F329 Major depressive disorder, single episode, unspecified: Secondary | ICD-10-CM

## 2018-02-07 DIAGNOSIS — Z803 Family history of malignant neoplasm of breast: Secondary | ICD-10-CM | POA: Diagnosis not present

## 2018-02-07 DIAGNOSIS — Z0000 Encounter for general adult medical examination without abnormal findings: Secondary | ICD-10-CM | POA: Diagnosis not present

## 2018-02-07 DIAGNOSIS — R45851 Suicidal ideations: Secondary | ICD-10-CM

## 2018-02-07 DIAGNOSIS — Z113 Encounter for screening for infections with a predominantly sexual mode of transmission: Secondary | ICD-10-CM

## 2018-02-07 DIAGNOSIS — F319 Bipolar disorder, unspecified: Secondary | ICD-10-CM

## 2018-02-07 DIAGNOSIS — Z23 Encounter for immunization: Secondary | ICD-10-CM

## 2018-02-07 DIAGNOSIS — F32A Depression, unspecified: Secondary | ICD-10-CM

## 2018-02-07 DIAGNOSIS — Z124 Encounter for screening for malignant neoplasm of cervix: Secondary | ICD-10-CM | POA: Insufficient documentation

## 2018-02-07 NOTE — Assessment & Plan Note (Signed)
USPSTF grade A and B recommendations reviewed with patient; age-appropriate recommendations, preventive care, screening tests, etc discussed and encouraged; healthy living encouraged; see AVS for patient education given to patient  

## 2018-02-07 NOTE — Progress Notes (Signed)
Patient ID: Misty Peters, female   DOB: 08-06-1995, 22 y.o.   MRN: [id number]   Subjective:   Misty Peters is a 22 y.o. female here for a complete physical exam  Interim issues since last visit: She needs two shots for school; meningitis shot for school and tetanus  USPSTF grade A and B recommendations Depression:  Depression screen [facility] 2/9 02/07/2018 11/10/2016 11/07/2015 07/31/2015  Decreased Interest 3 0 3 1  Down, Depressed, Hopeless 3 0 3 1  PHQ - 2 Score 6 0 6 2  Altered sleeping 3 - 3 -  Tired, decreased energy 3 - 3 -  Change in appetite 0 - 3 -  Feeling bad or failure about yourself  0 - 0 -  Trouble concentrating 0 - 2 -  Moving slowly or fidgety/restless 0 - 2 -  Suicidal thoughts 3 - 0 -  PHQ-9 Score 15 - 19 -  Difficult doing work/chores Not difficult at all - Somewhat difficult -  MD notes: she has been having suicidal thoughts for a long while; some days she feels sad and some days no clue; sometimes she is driving down the road and contemplates crashing her car, going off the road; thinks about just ending; she has thought about pills, not using a weapon; thinking about caskets; would not want to do strangulation, has thought a lot about it; wants to be able for parents to say goodbye; not sure about family hx of suicide; friends have committed suicide; several others have tried; had thoughts just this morning about suicide; she got an email on Friday at 4:30 pm that said she needed shots; couldn't get anyone at school, couldn't get a prescription, rushed to [facility] to see if she could get a shot there; they helped her get an appointment; that was really stressing out; goes to [employer], 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   BMI Readings from Last 3 Encounters:  02/07/18 26.35 kg/m  11/10/16 22.84 kg/m  11/07/15 19.57 kg/m (22 %, Z= -0.77)*   * Growth percentiles are based on CDC (Girls, 2-20 Years) data.     Skin cancer: no worrisome moles; wears sunscreen on the beach, sunscreen on face, taking accutane, managed by dermatologist, over near [place]; has a freckle x one year, on breast Lung cancer:  nonsmoker Breast cancer: nothing worrisome Colorectal cancer: no known, but not open family Cervical cancer screening: today  BRCA gene screening: family hx of breast and/or ovarian cancer and/or metastatic prostate cancer? Two grandmothers with breast cancer, one had skin cancer  HIV, hep B, hep C: testing agreed to STD testing and prevention (chl/gon/syphilis): agreed to Intimate partner violence: no abuse; no more Contraception: condom, thinking about OCPs; no problems with prior OCPs Osteoporosis: n/a Fall prevention/vitamin D: discussed Immunizations: meningitis and tetanus Diet: fruits and veggies, main source; used to be vegetarian, knows about B12, small amounts of meat Exercise: lots of running and lifting Alcohol: rare, no Tobacco use: nonsmoker AAA: n/a Aspirin: n/a  Glucose:  Glucose  Date Value Ref Range Status  11/07/2015 90 65 - 99 mg/dL Final  09/15/2014 99 mg/dL Final    Comment:    65-99 NOTE: New Reference Range  08/28/14   09/22/2013 99 65 - 99 mg/dL Final  07/02/2012 85 65 - 99 mg/dL Final   Glucose, Bld  Date Value Ref Range Status  02/07/2018 87 65 - 139 mg/dL Final    Comment:    .        Non-fasting reference interval .    Lipids:  Lab Results  Component Value Date   CHOL 153 02/07/2018   CHOL 133 11/07/2015   Lab Results  Component Value Date    HDL 50 (L) 02/07/2018   HDL 67 11/07/2015   Lab Results  Component Value Date   LDLCALC 83 02/07/2018   LDLCALC 56 11/07/2015   Lab Results  Component Value Date   TRIG 107 02/07/2018   TRIG 52 11/07/2015   Lab Results  Component Value Date   CHOLHDL 3.1 02/07/2018   No results found for: LDLDIRECT   Past Medical History:  Diagnosis Date  . Anxiety   . Bipolar disorder with depression ([place]) 11/07/2015  . Depression   . Headache    Past Surgical History:  Procedure Laterality Date  . GUM SURGERY    . SALIVARY STONE REMOVAL    . WISDOM TOOTH EXTRACTION     Family History  Problem Relation Age of Onset  . Hyperlipidemia Father   . Hypertension Father   . Diabetes Father   . Heart disease Paternal Grandmother   . Stroke Paternal Grandmother   . Cancer Paternal Grandmother        lung and breast  . Cancer Maternal Grandmother        breast  . COPD Neg Hx    Social History   Tobacco Use  . Smoking status: Never Smoker  . Smokeless tobacco: Never Used  Substance Use Topics  . Alcohol use: No    Alcohol/week: 0.0 standard drinks  . Drug use: No   Review of Systems  Objective:   Vitals:   02/07/18 1528  BP: 116/72  Pulse: 93  Temp: 98.5 F (36.9 C)  SpO2: 96%  Weight: 136 lb 9.6 oz (62 kg)  Height: 5' 0.38" (1.534 m)   Body mass index is 26.35 kg/m. Wt Readings from Last 3 Encounters:  02/07/18 136 lb 9.6 oz (62 kg)  11/10/16 124 lb 14.4 oz (56.7 kg)  11/07/15 107 lb (48.5 kg) (10 %, Z= -1.25)*   * Growth percentiles are based on CDC (Girls, 2-20 Years) data.   Physical Exam  Constitutional: She appears well-developed and well-nourished.  HENT:  Head: Normocephalic and atraumatic.  Eyes: Conjunctivae and EOM are normal. Right eye exhibits no hordeolum. Left eye exhibits no hordeolum. No scleral icterus.  Neck: Carotid bruit is not present. No thyromegaly present.  Cardiovascular: Normal rate, regular rhythm, S1 normal, S2 normal and normal  heart sounds.  No extrasystoles are present.  Pulmonary/Chest: Effort normal and breath sounds normal. No respiratory distress. Right breast exhibits no inverted nipple, no mass, no nipple discharge, no skin change and no tenderness. Left breast exhibits no inverted nipple, no mass, no nipple discharge, no skin change and no tenderness. Breasts are symmetrical.  Abdominal: Soft. Normal appearance and bowel sounds are normal. She exhibits no distension, no abdominal bruit, no pulsatile midline mass and no mass. There is no hepatosplenomegaly. There is no  tenderness. No hernia.  Genitourinary: Uterus normal. Pelvic exam was performed with patient prone. There is no rash or lesion on the right labia. There is no rash or lesion on the left labia. Cervix exhibits no motion tenderness. Right adnexum displays no mass, no tenderness and no fullness. Left adnexum displays no mass, no tenderness and no fullness.  Musculoskeletal: Normal range of motion. She exhibits no edema.  Lymphadenopathy:       Head (right side): No submandibular adenopathy present.       Head (left side): No submandibular adenopathy present.    She has no cervical adenopathy.    She has no axillary adenopathy.  Neurological: She is alert. She displays no tremor. No cranial nerve deficit. She exhibits normal muscle tone. Gait normal.  Skin: Skin is warm and dry. No bruising and no ecchymosis noted. No cyanosis. No pallor.  Psychiatric: Her behavior is normal. Her mood appears not anxious. Her affect is not blunt, not labile and not inappropriate. Her speech is not delayed, not tangential and not slurred. She does not exhibit a depressed mood. She expresses suicidal (chronic, discussed in detail) ideation.    Assessment/Plan:   Problem List Items Addressed This Visit      Other   Depression   Relevant Orders   VITAMIN D 25 Hydroxy (Vit-D Deficiency, Fractures) (Completed)   Preventative health care - Primary    USPSTF grade A and B  recommendations reviewed with patient; age-appropriate recommendations, preventive care, screening tests, etc discussed and encouraged; healthy living encouraged; see AVS for patient education given to patient       Relevant Orders   CBC with Differential/Platelet (Completed)   COMPLETE METABOLIC PANEL WITH GFR (Completed)   Lipid panel (Completed)   TSH (Completed)   Suicidal ideations    I spoke with psychiatrist about getting her seen quickly; talked with patient about seeking help if actually thinking about following through and she agrees; would not hurt herself, thinking of what that would do to her family; list of resources given and shown in AVS; she agrees to seek help, and will get her in to see psychiatrist this week      Relevant Orders   Ambulatory referral to Psychiatry   Bipolar disorder with depression [facility])    Patient says she has not been diagnosed with bipolar d/o, but it's in our chart; will refer to psychiatrist; will not start atypical in setting of suicidal ideation in case it exacerbates that       Other Visit Diagnoses    Family history of breast cancer       Relevant Orders   Ambulatory referral to Genetics   Screening for cervical cancer       Relevant Orders   Cytology - PAP   Need for meningitis vaccination       Relevant Orders   Meningococcal MCV4O(Menveo) (Completed)   Need for HPV vaccination       Relevant Orders   HPV 9-valent vaccine,Recombinat (Completed)   Need for Tdap vaccination       Relevant Orders   Tdap vaccine greater than or equal to 7yo IM (Completed)   Screen for STD (sexually transmitted disease)       Relevant Orders   RPR (Completed)   HIV antibody (Completed)   Hepatitis panel, acute       No orders of the defined types were placed in this encounter.  Orders Placed This Encounter  Procedures  . Meningococcal MCV4O(Menveo)  .  Tdap vaccine greater than or equal to 7yo IM  . HPV 9-valent vaccine,Recombinat  . RPR  .  HIV antibody  . Hepatitis panel, acute  . CBC with Differential/Platelet  . COMPLETE METABOLIC PANEL WITH GFR  . Lipid panel  . TSH  . VITAMIN D 25 Hydroxy (Vit-D Deficiency, Fractures)  . Ambulatory referral to Psychiatry    Referral Priority:   High    Referral Type:   Psychiatric    Referral Reason:   Specialty Services Required    Referred to Provider:   Chauncey Mann, MD    Requested Specialty:   Psychiatry    Number of Visits Requested:   1  . Ambulatory referral to Genetics    Referral Priority:   Routine    Referral Type:   Consultation    Referral Reason:   Specialty Services Required    Number of Visits Requested:   1    Follow up plan: Return in about 2 weeks (around 02/21/2018) for follow-up with Suezanne Cheshire, NP (mood); one year physical.  An After Visit Summary was printed and given to the patient.

## 2018-02-07 NOTE — Patient Instructions (Addendum)
Take 800 iu of vitamin D3 daily on days not outside or in the winter  We'll have you see the genetics counselor We'll have you see the psychiatrist Here are some resources to help you if you feel you are in a mental health crisis:  [place] - Call [phone]  for help - Website with more resources: GripTrip.com.pt  [facility] - Call [phone] for help. - Mobile Crisis Program available 24 hours a day, 365 days a year. - Available for anyone of any age in [place] counties.  [facility] - Address: [address], [place] - Telephone: [phone]  - Hours of Operation: Sunday - Saturday - 8:00 a.m. - 8:00 p.m. - Medicaid, Medicare (Government Issued Only), BCBS, and [place] [facility], [place], Psychiatrists on-site to provide medication management, [place], and Peer Support Care.  [facility] - Call [phone] for help. - Mobile Crisis Program available 24 hours a day, 365 days a year. - Available for anyone of any age in [place] Maintenance, Female Adopting a healthy lifestyle and getting preventive care can go a long way to promote health and wellness. Talk with your health care provider about what schedule of regular examinations is right for you. This is a good chance for you to check in with your provider about disease prevention and staying healthy. In between checkups, there are plenty of things you can do on your own. Experts have done a lot of research about which lifestyle changes and preventive measures are most likely to keep you healthy. Ask your health care provider for more information. Weight and diet Eat a healthy diet  Be sure to include plenty of vegetables, fruits, low-fat dairy products, and lean protein.  Do not eat a  lot of foods high in solid fats, added sugars, or salt.  Get regular exercise. This is one of the most important things you can do for your health. ? Most adults should exercise for at least 150 minutes each week. The exercise should increase your heart rate and make you sweat (moderate-intensity exercise). ? Most adults should also do strengthening exercises at least twice a week. This is in addition to the moderate-intensity exercise.  Maintain a healthy weight  Body mass index (BMI) is a measurement that can be used to identify possible weight problems. It estimates body fat based on height and weight. Your health care provider can help determine your BMI and help you achieve or maintain a healthy weight.  For females 15 years of age and older: ? A BMI below 18.5 is considered underweight. ? A BMI of 18.5 to 24.9 is normal. ? A BMI of 25 to 29.9 is considered overweight. ? A BMI of 30 and above is considered obese.  Watch levels of cholesterol and blood lipids  You should start having your blood tested for lipids and cholesterol at 22 years of age, then have this test every 5 years.  You may need to have your cholesterol levels checked more often if: ? Your lipid or cholesterol levels are high. ? You are older than 22 years of age. ? You are at high risk for heart disease.  Cancer screening Lung Cancer  Lung cancer screening is recommended for adults 17-64 years old who are at high risk for lung cancer because of a history of smoking.  A yearly low-dose CT scan  of the lungs is recommended for people who: ? Currently smoke. ? Have quit within the past 15 years. ? Have at least a 30-pack-year history of smoking. A pack year is smoking an average of one pack of cigarettes a day for 1 year.  Yearly screening should continue until it has been 15 years since you quit.  Yearly screening should stop if you develop a health problem that would prevent you from having lung cancer  treatment.  Breast Cancer  Practice breast self-awareness. This means understanding how your breasts normally appear and feel.  It also means doing regular breast self-exams. Let your health care provider know about any changes, no matter how small.  If you are in your 20s or 30s, you should have a clinical breast exam (CBE) by a health care provider every 1-3 years as part of a regular health exam.  If you are 67 or older, have a CBE every year. Also consider having a breast X-ray (mammogram) every year.  If you have a family history of breast cancer, talk to your health care provider about genetic screening.  If you are at high risk for breast cancer, talk to your health care provider about having an MRI and a mammogram every year.  Breast cancer gene (BRCA) assessment is recommended for women who have family members with BRCA-related cancers. BRCA-related cancers include: ? Breast. ? Ovarian. ? Tubal. ? Peritoneal cancers.  Results of the assessment will determine the need for genetic counseling and BRCA1 and BRCA2 testing.  Cervical Cancer Your health care provider may recommend that you be screened regularly for cancer of the pelvic organs (ovaries, uterus, and vagina). This screening involves a pelvic examination, including checking for microscopic changes to the surface of your cervix (Pap test). You may be encouraged to have this screening done every 3 years, beginning at age 75.  For women ages 57-65, health care providers may recommend pelvic exams and Pap testing every 3 years, or they may recommend the Pap and pelvic exam, combined with testing for human papilloma virus (HPV), every 5 years. Some types of HPV increase your risk of cervical cancer. Testing for HPV may also be done on women of any age with unclear Pap test results.  Other health care providers may not recommend any screening for nonpregnant women who are considered low risk for pelvic cancer and who do not have  symptoms. Ask your health care provider if a screening pelvic exam is right for you.  If you have had past treatment for cervical cancer or a condition that could lead to cancer, you need Pap tests and screening for cancer for at least 20 years after your treatment. If Pap tests have been discontinued, your risk factors (such as having a new sexual partner) need to be reassessed to determine if screening should resume. Some women have medical problems that increase the chance of getting cervical cancer. In these cases, your health care provider may recommend more frequent screening and Pap tests.  Colorectal Cancer  This type of cancer can be detected and often prevented.  Routine colorectal cancer screening usually begins at 22 years of age and continues through 22 years of age.  Your health care provider may recommend screening at an earlier age if you have risk factors for colon cancer.  Your health care provider may also recommend using home test kits to check for hidden blood in the stool.  A small camera at the end of a tube can be  used to examine your colon directly (sigmoidoscopy or colonoscopy). This is done to check for the earliest forms of colorectal cancer.  Routine screening usually begins at age 40.  Direct examination of the colon should be repeated every 5-10 years through 22 years of age. However, you may need to be screened more often if early forms of precancerous polyps or small growths are found.  Skin Cancer  Check your skin from head to toe regularly.  Tell your health care provider about any new moles or changes in moles, especially if there is a change in a mole's shape or color.  Also tell your health care provider if you have a mole that is larger than the size of a pencil eraser.  Always use sunscreen. Apply sunscreen liberally and repeatedly throughout the day.  Protect yourself by wearing long sleeves, pants, a wide-brimmed hat, and sunglasses whenever you  are outside.  Heart disease, diabetes, and high blood pressure  High blood pressure causes heart disease and increases the risk of stroke. High blood pressure is more likely to develop in: ? People who have blood pressure in the high end of the normal range (130-139/85-89 mm Hg). ? People who are overweight or obese. ? People who are African American.  If you are 37-16 years of age, have your blood pressure checked every 3-5 years. If you are 27 years of age or older, have your blood pressure checked every year. You should have your blood pressure measured twice-once when you are at a hospital or clinic, and once when you are not at a hospital or clinic. Record the average of the two measurements. To check your blood pressure when you are not at a hospital or clinic, you can use: ? An automated blood pressure machine at a pharmacy. ? A home blood pressure monitor.  If you are between 52 years and 39 years old, ask your health care provider if you should take aspirin to prevent strokes.  Have regular diabetes screenings. This involves taking a blood sample to check your fasting blood sugar level. ? If you are at a normal weight and have a low risk for diabetes, have this test once every three years after 22 years of age. ? If you are overweight and have a high risk for diabetes, consider being tested at a younger age or more often. Preventing infection Hepatitis B  If you have a higher risk for hepatitis B, you should be screened for this virus. You are considered at high risk for hepatitis B if: ? You were born in a country where hepatitis B is common. Ask your health care provider which countries are considered high risk. ? Your parents were born in a high-risk country, and you have not been immunized against hepatitis B (hepatitis B vaccine). ? You have HIV or AIDS. ? You use needles to inject street drugs. ? You live with someone who has hepatitis B. ? You have had sex with someone who  has hepatitis B. ? You get hemodialysis treatment. ? You take certain medicines for conditions, including cancer, organ transplantation, and autoimmune conditions.  Hepatitis C  Blood testing is recommended for: ? Everyone born from [birth year] through 1965. ? Anyone with known risk factors for hepatitis C.  Sexually transmitted infections (STIs)  You should be screened for sexually transmitted infections (STIs) including gonorrhea and chlamydia if: ? You are sexually active and are younger than 22 years of age. ? You are older than 22 years of  age and your health care provider tells you that you are at risk for this type of infection. ? Your sexual activity has changed since you were last screened and you are at an increased risk for chlamydia or gonorrhea. Ask your health care provider if you are at risk.  If you do not have HIV, but are at risk, it may be recommended that you take a prescription medicine daily to prevent HIV infection. This is called pre-exposure prophylaxis (PrEP). You are considered at risk if: ? You are sexually active and do not regularly use condoms or know the HIV status of your partner(s). ? You take drugs by injection. ? You are sexually active with a partner who has HIV.  Talk with your health care provider about whether you are at high risk of being infected with HIV. If you choose to begin PrEP, you should first be tested for HIV. You should then be tested every 3 months for as long as you are taking PrEP. Pregnancy  If you are premenopausal and you may become pregnant, ask your health care provider about preconception counseling.  If you may become pregnant, take 400 to 800 micrograms (mcg) of folic acid every day.  If you want to prevent pregnancy, talk to your health care provider about birth control (contraception). Osteoporosis and menopause  Osteoporosis is a disease in which the bones lose minerals and strength with aging. This can result in serious bone  fractures. Your risk for osteoporosis can be identified using a bone density scan.  If you are 29 years of age or older, or if you are at risk for osteoporosis and fractures, ask your health care provider if you should be screened.  Ask your health care provider whether you should take a calcium or vitamin D supplement to lower your risk for osteoporosis.  Menopause may have certain physical symptoms and risks.  Hormone replacement therapy may reduce some of these symptoms and risks. Talk to your health care provider about whether hormone replacement therapy is right for you. Follow these instructions at home:  Schedule regular health, dental, and eye exams.  Stay current with your immunizations.  Do not use any tobacco products including cigarettes, chewing tobacco, or electronic cigarettes.  If you are pregnant, do not drink alcohol.  If you are breastfeeding, limit how much and how often you drink alcohol.  Limit alcohol intake to no more than 1 drink per day for nonpregnant women. One drink equals 12 ounces of beer, 5 ounces of wine, or 1 ounces of hard liquor.  Do not use street drugs.  Do not share needles.  Ask your health care provider for help if you need support or information about quitting drugs.  Tell your health care provider if you often feel depressed.  Tell your health care provider if you have ever been abused or do not feel safe at home. This information is not intended to replace advice given to you by your health care provider. Make sure you discuss any questions you have with your health care provider. Document Released: 12/22/2010 Document Revised: 11/14/2015 Document Reviewed: 03/12/2015 Elsevier Interactive Patient Education  2018 [employer]. Suicidal Feelings: How to Help Yourself Suicide is the taking of one's own life. If you feel as though life is getting too tough to handle and are thinking about suicide, get help right away. To get help:  Call  your local emergency services (911 in the [place]  Call a suicide hotline to speak with  a trained counselor who understands how you are feeling. The following is a list of suicide hotlines in the [place]. For a list of hotlines in [place], visit FindSkins.pl. ? 1-800-273-TALK 818 492 1459). ? 1-800-SUICIDE 330-626-3013). ? 469-391-7448. This is a hotline for Spanish speakers. ? 1-800-799-4TTY 9387812847). This is a hotline for TTY users. ? 1-866-4-U-TREVOR (412)145-9627). This is a hotline for lesbian, gay, bisexual, transgender, or questioning youth.  Contact a crisis center or a local suicide prevention center. To find a crisis center or suicide prevention center: ? Call your local hospital, clinic, community service organization, mental health center, social service provider, or health department. Ask for assistance in connecting to a crisis center. ? Visit BankingRep.com.au for a list of crisis centers in the [place], or visit www.suicideprevention.ca/thinking-about-suicide/find-a-crisis-centre for a list of centers in [place].  Visit the following websites: ? National Suicide Prevention Lifeline: www.suicidepreventionlifeline.org ? Hopeline: www.hopeline.com ? [employer] for Suicide Prevention: PromotionalLoans.co.za ? [employer] (for lesbian, gay, bisexual, transgender, or questioning youth): www.thetrevorproject.org  How can I help myself feel better?  Promise yourself that you will not do anything drastic when you have suicidal feelings. Remember, there is hope. Many people have gotten through suicidal thoughts and feelings, and you will, too. You may have gotten through them before, and this proves that you can get through them again.  Let family, friends, teachers, or counselors know how you are feeling. Try not to isolate yourself from those who care about you.  Remember, they will want to help you. Talk with someone every day, even if you do not feel sociable. Face-to-face conversation is best.  Call a mental health professional and see one regularly.  Visit your primary health care provider every year.  Eat a well-balanced diet, and space your meals so you eat regularly.  Get plenty of rest.  Avoid alcohol and drugs, and remove them from your home. They will only make you feel worse.  If you are thinking of taking a lot of medicine, give your medicine to someone who can give it to you one day at a time. If you are on antidepressants and are concerned you will overdose, let your health care provider know so he or she can give you safer medicines. Ask your mental health professional about the possible side effects of any medicines you are taking.  Remove weapons, poisons, knives, and anything else that could harm you from your home.  Try to stick to routines. Follow a schedule every day. Put self-care on your schedule.  Make a list of realistic goals, and cross them off when you achieve them. Accomplishments give a sense of worth.  Wait until you are feeling better before doing the things you find difficult or unpleasant.  Exercise if you are able. You will feel better if you exercise for even a half hour each day.  Go out in the sun or into nature. This will help you recover from depression faster. If you have a favorite place to walk, go there.  Do the things that have always given you pleasure. Play your favorite music, read a good book, paint a picture, play your favorite instrument, or do anything else that takes your mind off your depression if it is safe to do.  Keep your living space well lit.  When you are feeling well, write yourself a letter about tips and support that you can read when you are not feeling well.  Remember that life's difficulties can be sorted out with  help. Conditions can be treated. You can work on thoughts and  strategies that serve you well. This information is not intended to replace advice given to you by your health care provider. Make sure you discuss any questions you have with your health care provider. Document Released: 12/13/2002 Document Revised: 02/05/2016 Document Reviewed: 10/03/2013 Elsevier Interactive Patient Education  [employer].

## 2018-02-08 ENCOUNTER — Telehealth: Payer: Self-pay

## 2018-02-08 ENCOUNTER — Other Ambulatory Visit: Payer: Self-pay | Admitting: Family Medicine

## 2018-02-08 DIAGNOSIS — R45851 Suicidal ideations: Secondary | ICD-10-CM | POA: Insufficient documentation

## 2018-02-08 LAB — CBC WITH DIFFERENTIAL/PLATELET
Basophils Absolute: 54 cells/uL (ref 0–200)
Basophils Relative: 0.5 %
Eosinophils Absolute: 76 cells/uL (ref 15–500)
Eosinophils Relative: 0.7 %
HCT: 37.1 % (ref 35.0–45.0)
Hemoglobin: 12.4 g/dL (ref 11.7–15.5)
Lymphs Abs: 3445 cells/uL (ref 850–3900)
MCH: 28.8 pg (ref 27.0–33.0)
MCHC: 33.4 g/dL (ref 32.0–36.0)
MCV: 86.3 fL (ref 80.0–100.0)
MPV: 9 fL (ref 7.5–12.5)
Monocytes Relative: 6.3 %
Neutro Abs: 6545 cells/uL (ref 1500–7800)
Neutrophils Relative %: 60.6 %
Platelets: 402 10*3/uL — ABNORMAL HIGH (ref 140–400)
RBC: 4.3 10*6/uL (ref 3.80–5.10)
RDW: 12 % (ref 11.0–15.0)
Total Lymphocyte: 31.9 %
WBC mixed population: 680 cells/uL (ref 200–950)
WBC: 10.8 10*3/uL (ref 3.8–10.8)

## 2018-02-08 LAB — COMPLETE METABOLIC PANEL WITH GFR
AG Ratio: 1.7 (calc) (ref 1.0–2.5)
ALT: 15 U/L (ref 6–29)
AST: 21 U/L (ref 10–30)
Albumin: 4.7 g/dL (ref 3.6–5.1)
Alkaline phosphatase (APISO): 66 U/L (ref 33–115)
BUN: 14 mg/dL (ref 7–25)
CO2: 25 mmol/L (ref 20–32)
Calcium: 9.9 mg/dL (ref 8.6–10.2)
Chloride: 105 mmol/L (ref 98–110)
Creat: 0.76 mg/dL (ref 0.50–1.10)
GFR, Est African American: 129 mL/min/{1.73_m2} (ref >=60)
GFR, Est Non African American: 111 mL/min/{1.73_m2} (ref >=60)
Globulin: 2.7 g/dL (calc) (ref 1.9–3.7)
Glucose, Bld: 87 mg/dL (ref 65–139)
Potassium: 3.9 mmol/L (ref 3.5–5.3)
Sodium: 139 mmol/L (ref 135–146)
Total Bilirubin: 0.3 mg/dL (ref 0.2–1.2)
Total Protein: 7.4 g/dL (ref 6.1–8.1)

## 2018-02-08 LAB — HEPATITIS PANEL, ACUTE
HEP A IGM: NONREACTIVE
HEP B S AG: NONREACTIVE
Hep B C IgM: NONREACTIVE
Hepatitis C Ab: NONREACTIVE
SIGNAL TO CUT-OFF: 0.01 (ref <1.00)

## 2018-02-08 LAB — HIV ANTIBODY (ROUTINE TESTING W REFLEX): HIV 1&2 Ab, 4th Generation: NONREACTIVE

## 2018-02-08 LAB — VITAMIN D 25 HYDROXY (VIT D DEFICIENCY, FRACTURES): Vit D, 25-Hydroxy: 21 ng/mL — ABNORMAL LOW (ref 30–100)

## 2018-02-08 LAB — LIPID PANEL
Cholesterol: 153 mg/dL (ref <200)
HDL: 50 mg/dL — ABNORMAL LOW (ref >50)
LDL Cholesterol (Calc): 83 mg/dL (calc)
Non-HDL Cholesterol (Calc): 103 mg/dL (calc) (ref <130)
Total CHOL/HDL Ratio: 3.1 (calc) (ref <5.0)
Triglycerides: 107 mg/dL (ref <150)

## 2018-02-08 LAB — TSH: TSH: 2.32 mIU/L

## 2018-02-08 LAB — RPR: RPR: NONREACTIVE

## 2018-02-08 MED ORDER — VITAMIN D (ERGOCALCIFEROL) 1.25 MG (50000 UNIT) PO CAPS: 50000.0000 [IU] | ORAL_CAPSULE | ORAL | 1 refills | Status: DC

## 2018-02-08 NOTE — Assessment & Plan Note (Signed)
I spoke with psychiatrist about getting her seen quickly; talked with patient about seeking help if actually thinking about following through and she agrees; would not hurt herself, thinking of what that would do to her family; list of resources given and shown in AVS; she agrees to seek help, and will get her in to see psychiatrist this week

## 2018-02-08 NOTE — Progress Notes (Signed)
Start rx vit D 

## 2018-02-08 NOTE — Assessment & Plan Note (Signed)
Patient says she has not been diagnosed with bipolar d/o, but it's in our chart; will refer to psychiatrist; will not start atypical in setting of suicidal ideation in case it exacerbates that

## 2018-02-08 NOTE — Telephone Encounter (Signed)
Referral coordinator called unable to get pt in this week with Psych. D. kapur does not except her ins.  We will send to [facility].  Contacted pt and stated they will be contacting her, but Dr. lada really wanted her to be seen this week by someone.  Info given to her for [facility]

## 2018-02-09 LAB — CYTOLOGY - PAP
CHLAMYDIA, DNA PROBE: NEGATIVE
Diagnosis: NEGATIVE
NEISSERIA GONORRHEA: NEGATIVE
Trichomonas: NEGATIVE

## 2018-02-25 ENCOUNTER — Ambulatory Visit: Payer: BLUE CROSS/BLUE SHIELD | Admitting: Family Medicine

## 2018-03-10 ENCOUNTER — Encounter: Payer: Self-pay | Admitting: Family Medicine

## 2018-03-10 ENCOUNTER — Ambulatory Visit (INDEPENDENT_AMBULATORY_CARE_PROVIDER_SITE_OTHER): Payer: 59 | Admitting: Family Medicine

## 2018-03-10 VITALS — BP 104/66 | HR 89 | Ht 61.0 in | Wt 135.6 lb

## 2018-03-10 DIAGNOSIS — Z7689 Persons encountering health services in other specified circumstances: Secondary | ICD-10-CM | POA: Diagnosis not present

## 2018-03-10 DIAGNOSIS — G43909 Migraine, unspecified, not intractable, without status migrainosus: Secondary | ICD-10-CM

## 2018-03-10 DIAGNOSIS — Z23 Encounter for immunization: Secondary | ICD-10-CM | POA: Diagnosis not present

## 2018-03-10 DIAGNOSIS — F331 Major depressive disorder, recurrent, moderate: Secondary | ICD-10-CM | POA: Diagnosis not present

## 2018-03-10 DIAGNOSIS — H539 Unspecified visual disturbance: Secondary | ICD-10-CM

## 2018-03-10 MED ORDER — SUMATRIPTAN SUCCINATE 50 MG PO TABS: 50.0000 mg | ORAL_TABLET | ORAL | 2 refills | Status: DC | PRN

## 2018-03-10 MED ORDER — AMITRIPTYLINE HCL 50 MG PO TABS: 50.0000 mg | ORAL_TABLET | Freq: Every day | ORAL | 1 refills | Status: DC

## 2018-03-10 NOTE — Patient Instructions (Signed)
[facility]  

## 2018-03-10 NOTE — Progress Notes (Signed)
BP 104/66   Pulse 89   Ht 5\' 1"  (1.549 m)   Wt 135 lb 9.6 oz (61.5 kg)   SpO2 97%   BMI 25.62 kg/m    Subjective:    Patient ID: Misty Peters, female    DOB: 11/29/1995, 22 y.o.   MRN: [id number]  HPI: Misty Peters is a 22 y.o. female  Chief Complaint  Patient presents with  . New Patient (Initial Visit)    pt would like to discuss about her eyes   Here today to establish care.   Several years of worsening vision issues. Having trouble seeing far away, Eye irritation, blurry vision. Was advised to see an Ophthalmologist a year or so ago but has not yet done so. No N/V, eye redness or pressure, drainage. Does have a hx of migraines that are currently untreated and not well controlled.   Migraines x 6 months, happening about 6 times or more per week. Feels like a pole is stabbing her in the forehead and they can last for up to 24 hours. N/V, blurry vision, photophobia associated. Takes lots of OTC pain relievers with mild relief. States she's sleeping poorly from the headaches.   Hx of depression, denies bipolar disorder though that is in her chart. States she does not have the highs of that just feels down quite often. Not currently on any form of treatment for this. Feels things are going fairly well right now. Denies current SI/HI, severe mood swings, hallucinations, appetite issues.   Relevant past medical, surgical, family and social history reviewed and updated as indicated. Interim medical history since our last visit reviewed. Allergies and medications reviewed and updated.  Review of Systems  Per HPI unless specifically indicated above     Objective:    BP 104/66   Pulse 89   Ht 5\' 1"  (1.549 m)   Wt 135 lb 9.6 oz (61.5 kg)   SpO2 97%   BMI 25.62 kg/m   Wt Readings from Last 3 Encounters:  03/10/18 135 lb 9.6 oz (61.5 kg)  02/07/18 136 lb 9.6 oz (62 kg)  11/10/16 124 lb 14.4 oz (56.7 kg)    Physical Exam  Constitutional: She is oriented to person,  place, and time. She appears well-developed and well-nourished.  HENT:  Head: Atraumatic.  Eyes: Conjunctivae and EOM are normal.  Neck: Normal range of motion. Neck supple.  Pulmonary/Chest: Effort normal and breath sounds normal.  Musculoskeletal: Normal range of motion.  Neurological: She is alert and oriented to person, place, and time. No cranial nerve deficit.  Skin: Skin is warm and dry.  Psychiatric: She has a normal mood and affect. Her behavior is normal.  Nursing note and vitals reviewed.  Visual Acuity: uncorrected R - 20/25 L - 20/30 Both - 20/20  Results for orders placed or performed in visit on 02/07/18  RPR  Result Value Ref Range   RPR Ser Ql NON-REACTIVE NON-REACTI  HIV antibody  Result Value Ref Range   HIV 1&2 Ab, 4th Generation NON-REACTIVE NON-REACTI  Hepatitis panel, acute  Result Value Ref Range   Hep A IgM NON-REACTIVE NON-REACTI   Hepatitis B Surface Ag NON-REACTIVE NON-REACTI   Hep B C IgM NON-REACTIVE NON-REACTI   Hepatitis C Ab NON-REACTIVE NON-REACTI   SIGNAL TO CUT-OFF 0.01 <1.00  CBC with Differential/Platelet  Result Value Ref Range   WBC 10.8 3.8 - 10.8 Thousand/uL   RBC 4.30 3.80 - 5.10 Million/uL   Hemoglobin 12.4 11.7 -  15.5 g/dL   HCT 16.137.1 09.635.0 - 04.545.0 %   MCV 86.3 80.0 - 100.0 fL   MCH 28.8 27.0 - 33.0 pg   MCHC 33.4 32.0 - 36.0 g/dL   RDW 40.912.0 81.111.0 - 91.415.0 %   Platelets 402 (H) 140 - 400 Thousand/uL   MPV 9.0 7.5 - 12.5 fL   Neutro Abs 6,545 1,500 - 7,800 cells/uL   Lymphs Abs 3,445 850 - 3,900 cells/uL   WBC mixed population 680 200 - 950 cells/uL   Eosinophils Absolute 76 15 - 500 cells/uL   Basophils Absolute 54 0 - 200 cells/uL   Neutrophils Relative % 60.6 %   Total Lymphocyte 31.9 %   Monocytes Relative 6.3 %   Eosinophils Relative 0.7 %   Basophils Relative 0.5 %  COMPLETE METABOLIC PANEL WITH GFR  Result Value Ref Range   Glucose, Bld 87 65 - 139 mg/dL   BUN 14 7 - 25 mg/dL   Creat 7.820.76 9.560.50 - 2.131.10 mg/dL   GFR,  Est Non African American 111 > OR = 60 mL/min/1.3173m2   GFR, Est African American 129 > OR = 60 mL/min/1.1473m2   BUN/Creatinine Ratio NOT APPLICABLE 6 - 22 (calc)   Sodium 139 135 - 146 mmol/L   Potassium 3.9 3.5 - 5.3 mmol/L   Chloride 105 98 - 110 mmol/L   CO2 25 20 - 32 mmol/L   Calcium 9.9 8.6 - 10.2 mg/dL   Total Protein 7.4 6.1 - 8.1 g/dL   Albumin 4.7 3.6 - 5.1 g/dL   Globulin 2.7 1.9 - 3.7 g/dL (calc)   AG Ratio 1.7 1.0 - 2.5 (calc)   Total Bilirubin 0.3 0.2 - 1.2 mg/dL   Alkaline phosphatase (APISO) 66 33 - 115 U/L   AST 21 10 - 30 U/L   ALT 15 6 - 29 U/L  Lipid panel  Result Value Ref Range   Cholesterol 153 <200 mg/dL   HDL 50 (L) >08>50 mg/dL   Triglycerides 657107 <846<150 mg/dL   LDL Cholesterol (Calc) 83 mg/dL (calc)   Total CHOL/HDL Ratio 3.1 <5.0 (calc)   Non-HDL Cholesterol (Calc) 103 <130 mg/dL (calc)  TSH  Result Value Ref Range   TSH 2.32 mIU/L  VITAMIN D 25 Hydroxy (Vit-D Deficiency, Fractures)  Result Value Ref Range   Vit D, 25-Hydroxy 21 (L) 30 - 100 ng/mL  Cytology - PAP  Result Value Ref Range   Adequacy      Satisfactory for evaluation  endocervical/transformation zone component PRESENT.   Diagnosis      NEGATIVE FOR INTRAEPITHELIAL LESIONS OR MALIGNANCY.   Chlamydia Negative    Neisseria gonorrhea Negative    Trichomonas Negative    Material Submitted CervicoVaginal Pap [ThinPrep Imaged]    CYTOLOGY - PAP PAP RESULT       Assessment & Plan:   Problem List Items Addressed This Visit      Cardiovascular and Mediastinum   Migraine    Start elavil QHS and imitrex prn, can continue prn OTC pain relievers.       Relevant Medications   SUMAtriptan (IMITREX) 50 MG tablet   amitriptyline (ELAVIL) 50 MG tablet     Other   Depression - Primary    Discussed counseling, starting elavil for migraines and hoping this will help with sleep and mood some too. Watch for hyperactivity.       Relevant Medications   amitriptyline (ELAVIL) 50 MG tablet      Other Visit Diagnoses  Encounter to establish care       Flu vaccine need       Relevant Orders   Flu Vaccine QUAD 36+ mos IM (Completed)   Vision changes       Unclear how much is related to migraines. Advised to go to [facility] for work-up as soon as possible, will treat migraines in meantime       Follow up plan: Return in about 6 weeks (around 04/21/2018) for Migraine f/u.

## 2018-03-13 DIAGNOSIS — G43909 Migraine, unspecified, not intractable, without status migrainosus: Secondary | ICD-10-CM | POA: Insufficient documentation

## 2018-03-13 NOTE — Assessment & Plan Note (Signed)
Discussed counseling, starting elavil for migraines and hoping this will help with sleep and mood some too. Watch for hyperactivity.

## 2018-03-13 NOTE — Assessment & Plan Note (Signed)
Start elavil QHS and imitrex prn, can continue prn OTC pain relievers.

## 2018-04-28 ENCOUNTER — Ambulatory Visit: Payer: 59 | Admitting: Family Medicine

## 2018-08-22 ENCOUNTER — Ambulatory Visit
Admission: EM | Admit: 2018-08-22 | Discharge: 2018-08-22 | Disposition: A | Discharge status: Home / Self Care | Payer: 59 | Attending: Family Medicine | Admitting: Family Medicine

## 2018-08-22 ENCOUNTER — Encounter: Payer: Self-pay | Admitting: Emergency Medicine

## 2018-08-22 ENCOUNTER — Other Ambulatory Visit: Payer: Self-pay

## 2018-08-22 DIAGNOSIS — R42 Dizziness and giddiness: Secondary | ICD-10-CM

## 2018-08-22 DIAGNOSIS — R51 Headache: Secondary | ICD-10-CM | POA: Diagnosis not present

## 2018-08-22 DIAGNOSIS — R6883 Chills (without fever): Secondary | ICD-10-CM | POA: Diagnosis not present

## 2018-08-22 DIAGNOSIS — R05 Cough: Secondary | ICD-10-CM | POA: Diagnosis not present

## 2018-08-22 DIAGNOSIS — R110 Nausea: Secondary | ICD-10-CM

## 2018-08-22 DIAGNOSIS — R509 Fever, unspecified: Secondary | ICD-10-CM

## 2018-08-22 DIAGNOSIS — B349 Viral infection, unspecified: Secondary | ICD-10-CM

## 2018-08-22 LAB — RAPID INFLUENZA A&B ANTIGENS: Influenza A (ARMC): NEGATIVE

## 2018-08-22 LAB — RAPID INFLUENZA A&B ANTIGENS (ARMC ONLY): INFLUENZA B (ARMC): NEGATIVE

## 2018-08-22 NOTE — ED Provider Notes (Signed)
[facility]    CSN: [id number] Arrival date & time: 08/22/18  1501     History   Chief Complaint Chief Complaint  Patient presents with  . Cough   HPI  23 year old female presents with concern for influenza.  Reports her symptoms started last night.  She reports dizziness, cough, nausea, headache, body aches, chills, runny nose, hoarseness.  No documented fever.  No subjective fever.  Patient states that her mother has similar symptoms.  Mother has had sick contacts at work.  Patient states that she also has trouble focusing.  No medications or interventions tried.  Patient is concerned that she has influenza and desires testing today.  No other associated symptoms.  No known exacerbating or relieving factors. No other complaints.   PMH, Surgical Hx, Family Hx, Social History reviewed as below.  Past Medical History:  Diagnosis Date  . Anxiety   . Bipolar disorder with depression (HCC) 11/07/2015  . Depression   . Headache    Patient Active Problem List   Diagnosis Date Noted  . Migraine 03/13/2018  . Suicidal ideations 02/08/2018  . Encounter for BCP (birth control pills) initial prescription 11/15/2016  . Bipolar disorder with depression (HCC) 11/07/2015  . Depression   . Anxiety    Past Surgical History:  Procedure Laterality Date  . GUM SURGERY    . SALIVARY STONE REMOVAL    . WISDOM TOOTH EXTRACTION      OB History   No obstetric history on file.     Home Medications    Prior to Admission medications   Medication Sig Start Date End Date Taking? Authorizing Provider  amitriptyline (ELAVIL) 50 MG tablet Take 1 tablet (50 mg total) by mouth at bedtime. 03/10/18   Particia Nearing, PA-C  ISOtretinoin (ACCUTANE) 40 MG capsule Take 40 mg by mouth daily.    [provider]  Norgestimate-Ethinyl Estradiol Triphasic (ORTHO TRI-CYCLEN LO) 0.18/0.215/0.25 MG-25 MCG tab Take 1 tablet by mouth daily. Patient not taking: Reported on 02/07/2018  11/10/16   Kerman Passey, MD  SUMAtriptan (IMITREX) 50 MG tablet Take 1 tablet (50 mg total) by mouth every 2 (two) hours as needed for migraine. May repeat in 2 hours if headache persists or recurs. 03/10/18   Particia Nearing, PA-C    Family History Family History  Problem Relation Age of Onset  . Hyperlipidemia Father   . Hypertension Father   . Diabetes Father   . Heart disease Paternal Grandmother   . Stroke Paternal Grandmother   . Cancer Paternal Grandmother        lung and breast  . Cancer Maternal Grandmother        breast  . COPD Neg Hx     Social History Social History   Tobacco Use  . Smoking status: Never Smoker  . Smokeless tobacco: Never Used  Substance Use Topics  . Alcohol use: No    Alcohol/week: 0.0 standard drinks  . Drug use: No     Allergies   Patient has no known allergies.   Review of Systems Review of Systems Per HPI  Physical Exam Triage Vital Signs ED Triage Vitals  Enc Vitals Group     BP 08/22/18 1554 102/75     Pulse Rate 08/22/18 1554 99     Resp 08/22/18 1554 16     Temp 08/22/18 1554 98.1 F (36.7 C)     Temp Source 08/22/18 1554 Oral     SpO2 08/22/18 1554 99 %  Weight 08/22/18 1550 120 lb (54.4 kg)     Height 08/22/18 1550 5\' 2"  (1.575 m)     Head Circumference --      Peak Flow --      Pain Score 08/22/18 1550 4     Pain Loc --      Pain Edu? --      Excl. in GC? --    Updated Vital Signs BP 102/75 (BP Location: Left Arm)   Pulse 99   Temp 98.1 F (36.7 C) (Oral)   Resp 16   Ht 5\' 2"  (1.575 m)   Wt 54.4 kg   LMP 07/24/2018 (Approximate)   SpO2 99%   BMI 21.95 kg/m   Visual Acuity Right Eye Distance:   Left Eye Distance:   Bilateral Distance:    Right Eye Near:   Left Eye Near:    Bilateral Near:     Physical Exam Constitutional:      General: She is not in acute distress.    Appearance: Normal appearance.  HENT:     Head: Normocephalic and atraumatic.     Mouth/Throat:     Pharynx:  Oropharynx is clear. No oropharyngeal exudate or posterior oropharyngeal erythema.  Eyes:     General:        Right eye: No discharge.        Left eye: No discharge.     Conjunctiva/sclera: Conjunctivae normal.  Cardiovascular:     Rate and Rhythm: Normal rate and regular rhythm.  Pulmonary:     Effort: Pulmonary effort is normal.     Breath sounds: Normal breath sounds.  Neurological:     Mental Status: She is alert.  Psychiatric:     Comments: Flat affect.   Depressed mood.    UC Treatments / Results  Labs (all labs ordered are listed, but only abnormal results are displayed) Labs Reviewed  RAPID INFLUENZA A&B ANTIGENS ([facility] ONLY)    EKG None  Radiology No results found.  Procedures Procedures (including critical care time)  Medications Ordered in UC Medications - No data to display  Initial Impression / Assessment and Plan / UC Course  I have reviewed the triage vital signs and the nursing notes.  Pertinent labs & imaging results that were available during my care of the patient were reviewed by me and considered in my medical decision making (see chart for details).    23 year old female presents with a suspected viral illness.  I do not believe that she has flu based on her symptomatology as well as her exam.  Flu test negative.  Advised supportive care.  Tylenol and ibuprofen as needed.  Final Clinical Impressions(s) / UC Diagnoses   Final diagnoses:  Viral illness     Discharge Instructions     Rest. Fluids.  Tylenol and ibuprofen as needed.  Take care  Dr. Adriana Simas    ED Prescriptions    None     Controlled Substance Prescriptions [place] Controlled Substance Registry consulted? Not Applicable   Tommie Sams, DO 08/22/18 1732

## 2018-08-22 NOTE — ED Triage Notes (Signed)
Pt c/o dizziness, cough, runny nose, congestion, malaise, body aches, chills and sweats. Started last night. Her mom has had flu also.

## 2018-08-22 NOTE — Discharge Instructions (Signed)
Rest.  Fluids.  Tylenol and ibuprofen as needed.  Take care  Dr. Cher Franzoni  

## 2019-03-16 ENCOUNTER — Other Ambulatory Visit: Payer: Self-pay

## 2019-03-16 ENCOUNTER — Ambulatory Visit
Admission: EM | Admit: 2019-03-16 | Discharge: 2019-03-16 | Disposition: A | Discharge status: Home / Self Care | Payer: Self-pay | Attending: Family Medicine | Admitting: Family Medicine

## 2019-03-16 ENCOUNTER — Encounter: Payer: Self-pay | Admitting: Emergency Medicine

## 2019-03-16 DIAGNOSIS — Z20828 Contact with and (suspected) exposure to other viral communicable diseases: Secondary | ICD-10-CM | POA: Diagnosis not present

## 2019-03-16 DIAGNOSIS — B349 Viral infection, unspecified: Secondary | ICD-10-CM | POA: Diagnosis not present

## 2019-03-16 DIAGNOSIS — R110 Nausea: Secondary | ICD-10-CM

## 2019-03-16 DIAGNOSIS — J029 Acute pharyngitis, unspecified: Secondary | ICD-10-CM

## 2019-03-16 DIAGNOSIS — Z1159 Encounter for screening for other viral diseases: Secondary | ICD-10-CM

## 2019-03-16 DIAGNOSIS — R5383 Other fatigue: Secondary | ICD-10-CM

## 2019-03-16 DIAGNOSIS — Z20822 Contact with and (suspected) exposure to covid-19: Secondary | ICD-10-CM

## 2019-03-16 DIAGNOSIS — R197 Diarrhea, unspecified: Secondary | ICD-10-CM

## 2019-03-16 LAB — RAPID INFLUENZA A&B ANTIGENS (ARMC ONLY)
Influenza A (ARMC): NEGATIVE
Influenza B (ARMC): NEGATIVE

## 2019-03-16 LAB — RAPID STREP SCREEN (MED CTR MEBANE ONLY): Streptococcus, Group A Screen (Direct): NEGATIVE

## 2019-03-16 MED ORDER — ONDANSETRON 4 MG PO TBDP: 4.0000 mg | ORAL_TABLET | Freq: Three times a day (TID) | ORAL | 0 refills | Status: DC | PRN

## 2019-03-16 NOTE — ED Triage Notes (Signed)
Patient c/o fever, sore throat, fatigue and nausea that started 3 days ago. 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

## 2019-03-16 NOTE — Discharge Instructions (Addendum)
It was very nice seeing you today in clinic. Thank you for entrusting me with your care.   Rest and increase fluid intake as much as possible. Water is always best, as sugar and caffeine containing fluids can cause you to become dehydrated. Try to incorporate electrolyte enriched fluids, such as Gatorade or Pedialyte, into your daily fluid intake. I have sent some medication for your nausea as well.  You were tested for SARS-CoV-2 (novel coronavirus) today. Please self quarantine at home until negative test results have been received. (per [place] DHHS guidelines).    Make arrangements to follow up with your regular doctor in 1 week for re-evaluation if not improving. If your symptoms/condition worsens, please seek follow up care either here or in the ER. Please remember, our [place] providers are "right here with you" when you need [place].   Again, it was my pleasure to take care of you today. Thank you for choosing our clinic. I hope that you start to feel better quickly.   Honor Loh, MSN, APRN, FNP-C, CEN Advanced Practice Provider [facility]

## 2019-03-17 LAB — NOVEL CORONAVIRUS, NAA (HOSP ORDER, SEND-OUT TO REF LAB; TAT 18-24 HRS): SARS-CoV-2, NAA: NOT DETECTED

## 2019-03-17 NOTE — ED Provider Notes (Signed)
[place], [place]   Name: Misty Peters DOB: Jun 12, 1996 MRN: [id number] CSN: [id number] PCP: Particia Nearing, PA-C  Arrival date and time:  03/16/19 1538  Chief Complaint:  Fever, COVID exposure, and Sore Throat   NOTE: Prior to seeing the patient today, I have reviewed the triage nursing documentation and vital signs. Clinical staff has updated patient's PMH/PSHx, current medication list, and drug allergies/intolerances to ensure comprehensive history available to assist in medical decision making.   History:   HPI: Misty Peters is a 23 y.o. female who presents today with complaints of recent direct exposure to SARS-CoV-2 (novel coronavirus). Known exposure reported to have occurred last week while attending classes at [employer]. 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 She presents today because she is in a class with 3 of the students that have tested positive for SARS-CoV-2. Patient reports fever (Tmax 103), nausea, intermittent diarrhea, sore throat, body aches, and changes to her ability to taste that began on Sunday (03/12/2019). She denies cough and shortness of breath. Patient has been able to eat and drink normally. She is voiding per her baseline habits. In efforts to conservatively manage her symptoms at home, the patient notes that she has used Nyquil and APAP, which have only minimally helped to improve her symptoms. Patient presents for testing out of concern for her personal health. She adds that she is is being required to provide documentation of negative test results in order to continue to participate in her scheduled classes at [employer].   Past Medical History:  Diagnosis Date  . Anxiety   . Bipolar disorder with depression (HCC) 11/07/2015  . Depression   . Headache     Past Surgical History:  Procedure Laterality Date  . GUM SURGERY    . SALIVARY STONE REMOVAL    . WISDOM TOOTH EXTRACTION       Family History  Problem Relation Age of Onset  . Hyperlipidemia Father   . Hypertension Father   . Diabetes Father   . Heart disease Paternal Grandmother   . Stroke Paternal Grandmother   . Cancer Paternal Grandmother        lung and breast  . Cancer Maternal Grandmother        breast  . COPD Neg Hx     Social History   Tobacco Use  . Smoking status: Never Smoker  . Smokeless tobacco: Never Used  Substance Use Topics  . Alcohol use: No    Alcohol/week: 0.0 standard drinks  . Drug use: No    Patient Active Problem List   Diagnosis Date Noted  . Migraine 03/13/2018  . Suicidal ideations 02/08/2018  . Encounter for BCP (birth control pills) initial prescription 11/15/2016  . Bipolar disorder with depression (HCC) 11/07/2015  . Depression   . Anxiety     Home Medications:    No outpatient medications have been marked as taking for the 03/16/19 encounter [facility] Encounter).    Allergies:   Patient has no known allergies.  Review of Systems (ROS): Review of Systems  Constitutional: Positive for fatigue and fever (Tmax 103).  HENT: Positive for rhinorrhea and sore throat. Negative for congestion, ear pain, postnasal drip, sinus pressure, sinus pain, sneezing and trouble swallowing.        Change in ability to taste normally.  Eyes: Negative for pain, discharge and redness.  Respiratory: Negative for cough, chest tightness and shortness of breath.  Cardiovascular: Negative for chest pain and palpitations.  Gastrointestinal: Positive for diarrhea (intermittent; few episodes) and nausea. Negative for abdominal pain and vomiting.  Genitourinary:       Voiding per her baseline habits.    Musculoskeletal: Positive for myalgias. Negative for arthralgias, back pain and neck pain.  Skin: Negative for color change, pallor and rash.  Neurological: Negative for dizziness, syncope, weakness and headaches.  Hematological: Negative for adenopathy.   Psychiatric/Behavioral: The patient is nervous/anxious.      Vital Signs: Today's Vitals   03/16/19 1601 03/16/19 1602  BP: 106/80   Pulse: 95   Resp: 18   Temp: 98.2 F (36.8 C)   TempSrc: Oral   SpO2: 99%   Weight:  113 lb (51.3 kg)  Height:  5\' 2"  (1.575 m)  PainSc:  4     Physical Exam: Physical Exam  Constitutional: She is oriented to person, place, and time and well-developed, well-nourished, and in no distress. She appears not dehydrated.  Non-toxic appearance. She has a sickly appearance (acutely ill appearing). No distress.  HENT:  Head: Normocephalic and atraumatic.  Right Ear: Tympanic membrane normal.  Left Ear: Tympanic membrane normal.  Nose: Rhinorrhea present. No sinus tenderness.  Mouth/Throat: Uvula is midline and mucous membranes are normal. Posterior oropharyngeal erythema present. No oropharyngeal exudate.  Eyes: Pupils are equal, round, and reactive to light. EOM are normal.  Neck: Normal range of motion. Neck supple. No tracheal deviation present.  Cardiovascular: Normal rate, regular rhythm, normal heart sounds and intact distal pulses. Exam reveals no gallop and no friction rub.  No murmur heard. Pulmonary/Chest: Effort normal and breath sounds normal. No respiratory distress. She has no wheezes. She has no rales.  Neurological: She is alert and oriented to person, place, and time. Gait normal.  Skin: Skin is warm and dry. No rash noted.  Psychiatric: Mood, memory, affect and judgment normal.  Nursing note and vitals reviewed.   Urgent Care Treatments / Results:   LABS: PLEASE NOTE: all labs that were ordered this encounter are listed, however only abnormal results are displayed. Labs Reviewed  NOVEL CORONAVIRUS, NAA (HOSP ORDER, SEND-OUT TO REF LAB; TAT 18-24 HRS)  RAPID INFLUENZA A&B ANTIGENS ([facility] ONLY)  RAPID STREP SCREEN ([facility] ONLY)  CULTURE, GROUP A STREP [facility])    EKG: -None  RADIOLOGY: No results found.  PROCEDURES:  Procedures  MEDICATIONS RECEIVED THIS VISIT: Medications - No data to display  PERTINENT CLINICAL COURSE NOTES/UPDATES:   Initial Impression / Assessment and Plan / Urgent Care Course:  Pertinent labs & imaging results that were available during my care of the patient were personally reviewed by me and considered in my medical decision making (see lab/imaging section of note for values and interpretations).  Misty Peters is a 23 y.o. female who presents to [facility] today with complaints of Fever, COVID exposure, and Sore Throat  Patient acutely ill appearing. She does not appear to toxic or in any acute distress today in clinic. Presenting symptoms (see HPI) and exam as documented above. She presents following a direct exposure to SARS-CoV-2 (novel coronavirus). Discussed typical symptom constellation. Reviewed potential for infection with recent close contact. Given exposure and potential for infection, testing is reasonable. SARS-CoV-2 swab collected by certified clinical staff. Discussed variable turn around times associated with testing, as swabs are being processed at [facility], and have been between 2-5 days to come back. She was advised to self quarantine, per [facility] DHHS guidelines, until negative  results received.   Rapid influenza diagnotic test (RIDT) negative for both A/B Ag. Rapid streptococcal throat swab (-); reflex culture sent. Suspect viral illness, of which SARS-CoV-2 (novel coronavirus) is still part of the differential. Discussed supportive care measures at home during acute phase of illness. Patient to rest as much as possible. She was encouraged to ensure adequate hydration (water and ORS) to prevent dehydration and electrolyte derangements. Patient may use APAP and/or IBU on an as needed basis for pain/fever. Will send in a PRN supply of ondansetron for patient to use for her reported nausea.   Discussed follow up with primary care physician in 1 week for  re-evaluation. I have reviewed the follow up and strict return precautions for any new or worsening symptoms. Patient is aware of symptoms that would be deemed urgent/emergent, and would thus require further evaluation either here or in the emergency department. At the time of discharge, she verbalized understanding and consent with the discharge plan as it was reviewed with her. All questions were fielded by provider and/or clinic staff prior to patient discharge.    Final Clinical Impressions / Urgent Care Diagnoses:   Final diagnoses:  Encounter for screening for other viral diseases  Exposure to Covid-19 Virus  Viral illness    New Prescriptions:  [place] Controlled Substance Registry consulted? Not Applicable  Meds ordered this encounter  Medications  . ondansetron (ZOFRAN-ODT) 4 MG disintegrating tablet    Sig: Take 1 tablet (4 mg total) by mouth every 8 (eight) hours as needed.    Dispense:  15 tablet    Refill:  0    Recommended Follow up Care:  Patient encouraged to follow up with the following provider within the specified time frame, or sooner as dictated by the severity of her symptoms. As always, she was instructed that for any urgent/emergent care needs, she should seek care either here or in the emergency department for more immediate evaluation.  Follow-up Information    Particia NearingLane, Rachel Elizabeth, PA-C In 1 week.   Specialty: Family Medicine Why: General reassessment of symptoms if not improving Contact information: [address] [place] [address] [phone]         NOTE: This note was prepared using Dragon dictation software along with smaller phrase technology. Despite my best ability to proofread, there is the potential that transcriptional errors may still occur from this process, and are completely unintentional.     Verlee MonteGray, Kemoni Quesenberry E, NP 03/17/19 2356

## 2019-03-19 LAB — CULTURE, GROUP A STREP (THRC)

## 2019-08-13 ENCOUNTER — Other Ambulatory Visit: Payer: Self-pay

## 2019-08-13 ENCOUNTER — Inpatient Hospital Stay
Admit: 2019-08-13 | Discharge: 2019-08-16 | DRG: 885 | Disposition: A | Discharge status: Home / Self Care | Payer: No Typology Code available for payment source | Source: Ambulatory Visit | Attending: Psychiatry | Admitting: Psychiatry

## 2019-08-13 ENCOUNTER — Encounter: Payer: Self-pay | Admitting: Emergency Medicine

## 2019-08-13 ENCOUNTER — Encounter: Payer: Self-pay | Admitting: Psychiatric/Mental Health

## 2019-08-13 ENCOUNTER — Emergency Department
Admission: EM | Admit: 2019-08-13 | Discharge: 2019-08-13 | Disposition: A | Discharge status: Other Acute Inpatient Hospital | Payer: Self-pay | Attending: Emergency Medicine | Admitting: Emergency Medicine

## 2019-08-13 DIAGNOSIS — F332 Major depressive disorder, recurrent severe without psychotic features: Secondary | ICD-10-CM | POA: Diagnosis not present

## 2019-08-13 DIAGNOSIS — T1491XA Suicide attempt, initial encounter: Secondary | ICD-10-CM | POA: Insufficient documentation

## 2019-08-13 DIAGNOSIS — Y929 Unspecified place or not applicable: Secondary | ICD-10-CM | POA: Insufficient documentation

## 2019-08-13 DIAGNOSIS — G4700 Insomnia, unspecified: Secondary | ICD-10-CM | POA: Diagnosis present

## 2019-08-13 DIAGNOSIS — F3130 Bipolar disorder, current episode depressed, mild or moderate severity, unspecified: Principal | ICD-10-CM | POA: Insufficient documentation

## 2019-08-13 DIAGNOSIS — S61512A Laceration without foreign body of left wrist, initial encounter: Secondary | ICD-10-CM | POA: Insufficient documentation

## 2019-08-13 DIAGNOSIS — Z818 Family history of other mental and behavioral disorders: Secondary | ICD-10-CM

## 2019-08-13 DIAGNOSIS — F314 Bipolar disorder, current episode depressed, severe, without psychotic features: Secondary | ICD-10-CM

## 2019-08-13 DIAGNOSIS — F4310 Post-traumatic stress disorder, unspecified: Secondary | ICD-10-CM

## 2019-08-13 DIAGNOSIS — Z20822 Contact with and (suspected) exposure to covid-19: Secondary | ICD-10-CM | POA: Insufficient documentation

## 2019-08-13 DIAGNOSIS — Y999 Unspecified external cause status: Secondary | ICD-10-CM | POA: Insufficient documentation

## 2019-08-13 DIAGNOSIS — X789XXA Intentional self-harm by unspecified sharp object, initial encounter: Secondary | ICD-10-CM | POA: Diagnosis present

## 2019-08-13 DIAGNOSIS — F329 Major depressive disorder, single episode, unspecified: Secondary | ICD-10-CM

## 2019-08-13 DIAGNOSIS — Y939 Activity, unspecified: Secondary | ICD-10-CM | POA: Insufficient documentation

## 2019-08-13 DIAGNOSIS — R45851 Suicidal ideations: Secondary | ICD-10-CM | POA: Diagnosis present

## 2019-08-13 DIAGNOSIS — Z79899 Other long term (current) drug therapy: Secondary | ICD-10-CM | POA: Insufficient documentation

## 2019-08-13 DIAGNOSIS — X781XXA Intentional self-harm by knife, initial encounter: Secondary | ICD-10-CM | POA: Insufficient documentation

## 2019-08-13 LAB — CBC WITH DIFFERENTIAL/PLATELET
Abs Immature Granulocytes: 0.04 10*3/uL (ref 0.00–0.07)
Basophils Absolute: 0 10*3/uL (ref 0.0–0.1)
Basophils Relative: 0 %
Eosinophils Absolute: 0.1 10*3/uL (ref 0.0–0.5)
Eosinophils Relative: 1 %
HCT: 39 % (ref 36.0–46.0)
Hemoglobin: 12.9 g/dL (ref 12.0–15.0)
Immature Granulocytes: 0 %
Lymphocytes Relative: 19 %
Lymphs Abs: 2.7 10*3/uL (ref 0.7–4.0)
MCH: 28.2 pg (ref 26.0–34.0)
MCHC: 33.1 g/dL (ref 30.0–36.0)
MCV: 85.2 fL (ref 80.0–100.0)
Monocytes Absolute: 0.5 10*3/uL (ref 0.1–1.0)
Monocytes Relative: 3 %
Neutro Abs: 11 10*3/uL — ABNORMAL HIGH (ref 1.7–7.7)
Neutrophils Relative %: 77 %
Platelets: 470 10*3/uL — ABNORMAL HIGH (ref 150–400)
RBC: 4.58 MIL/uL (ref 3.87–5.11)
RDW: 12.4 % (ref 11.5–15.5)
WBC: 14.4 10*3/uL — ABNORMAL HIGH (ref 4.0–10.5)
nRBC: 0 % (ref 0.0–0.2)

## 2019-08-13 LAB — ETHANOL: Alcohol, Ethyl (B): <10 mg/dL (ref <10)

## 2019-08-13 LAB — URINALYSIS, COMPLETE (UACMP) WITH MICROSCOPIC
Bacteria, UA: NONE SEEN
Bilirubin Urine: NEGATIVE
Glucose, UA: NEGATIVE mg/dL
Hgb urine dipstick: NEGATIVE
Ketones, ur: NEGATIVE mg/dL
Leukocytes,Ua: NEGATIVE
Nitrite: NEGATIVE
Protein, ur: 30 mg/dL — AB
Specific Gravity, Urine: 1.035 — ABNORMAL HIGH (ref 1.005–1.030)
WBC, UA: NONE SEEN WBC/hpf (ref 0–5)
pH: 5 (ref 5.0–8.0)

## 2019-08-13 LAB — COMPREHENSIVE METABOLIC PANEL
ALT: 24 U/L (ref 0–44)
AST: 23 U/L (ref 15–41)
Albumin: 3.8 g/dL (ref 3.5–5.0)
Alkaline Phosphatase: 66 U/L (ref 38–126)
Anion gap: 9 (ref 5–15)
BUN: 13 mg/dL (ref 6–20)
CO2: 20 mmol/L — ABNORMAL LOW (ref 22–32)
Calcium: 9.1 mg/dL (ref 8.9–10.3)
Chloride: 108 mmol/L (ref 98–111)
Creatinine, Ser: 0.7 mg/dL (ref 0.44–1.00)
GFR calc Af Amer: >60 mL/min (ref >60)
GFR calc non Af Amer: >60 mL/min (ref >60)
Glucose, Bld: 146 mg/dL — ABNORMAL HIGH (ref 70–99)
Potassium: 4 mmol/L (ref 3.5–5.1)
Sodium: 137 mmol/L (ref 135–145)
Total Bilirubin: 0.6 mg/dL (ref 0.3–1.2)
Total Protein: 7.5 g/dL (ref 6.5–8.1)

## 2019-08-13 LAB — URINE DRUG SCREEN, QUALITATIVE (ARMC ONLY)
Amphetamines, Ur Screen: NOT DETECTED
Barbiturates, Ur Screen: NOT DETECTED
Benzodiazepine, Ur Scrn: NOT DETECTED
Cannabinoid 50 Ng, Ur ~~LOC~~: NOT DETECTED
Cocaine Metabolite,Ur ~~LOC~~: NOT DETECTED
MDMA (Ecstasy)Ur Screen: NOT DETECTED
Methadone Scn, Ur: NOT DETECTED
Opiate, Ur Screen: NOT DETECTED
Phencyclidine (PCP) Ur S: NOT DETECTED
Tricyclic, Ur Screen: NOT DETECTED

## 2019-08-13 LAB — RESPIRATORY PANEL BY RT PCR (FLU A&B, COVID)
Influenza A by PCR: NEGATIVE
Influenza B by PCR: NEGATIVE
SARS Coronavirus 2 by RT PCR: NEGATIVE

## 2019-08-13 LAB — SALICYLATE LEVEL: Salicylate Lvl: <7 mg/dL — ABNORMAL LOW (ref 7.0–30.0)

## 2019-08-13 LAB — ACETAMINOPHEN LEVEL: Acetaminophen (Tylenol), Serum: <10 ug/mL — ABNORMAL LOW (ref 10–30)

## 2019-08-13 LAB — POCT PREGNANCY, URINE: Preg Test, Ur: NEGATIVE

## 2019-08-13 MED ORDER — ARIPIPRAZOLE 2 MG PO TABS: 2.0000 mg | ORAL_TABLET | Freq: Every day | ORAL | Status: DC

## 2019-08-13 MED ORDER — ACETAMINOPHEN 325 MG PO TABS
650.0000 mg | ORAL_TABLET | Freq: Four times a day (QID) | ORAL | Status: DC | PRN
  Administered 2019-08-13 – 2019-08-15 (×4): 650 mg via ORAL
  Filled 2019-08-13 (×4): qty 2

## 2019-08-13 MED ORDER — HYDROXYZINE HCL 25 MG PO TABS
25.0000 mg | ORAL_TABLET | Freq: Three times a day (TID) | ORAL | Status: DC | PRN
  Administered 2019-08-15: 25 mg via ORAL
  Filled 2019-08-13: qty 1

## 2019-08-13 MED ORDER — LIDOCAINE HCL (PF) 1 % IJ SOLN
5.0000 mL | Freq: Once | INTRAMUSCULAR | Status: AC
  Administered 2019-08-13: 03:00:00 5 mL via INTRADERMAL

## 2019-08-13 MED ORDER — ALUM & MAG HYDROXIDE-SIMETH 200-200-20 MG/5ML PO SUSP: 30.0000 mL | ORAL | Status: DC | PRN

## 2019-08-13 MED ORDER — BACITRACIN-NEOMYCIN-POLYMYXIN 400-5-5000 EX OINT
TOPICAL_OINTMENT | Freq: Two times a day (BID) | CUTANEOUS | Status: DC
  Administered 2019-08-13 – 2019-08-16 (×4): 1 via TOPICAL
  Filled 2019-08-13 (×5): qty 1

## 2019-08-13 MED ORDER — TRAZODONE HCL 50 MG PO TABS
25.0000 mg | ORAL_TABLET | Freq: Every evening | ORAL | Status: DC | PRN
  Administered 2019-08-14 – 2019-08-15 (×2): 25 mg via ORAL
  Filled 2019-08-13 (×2): qty 1

## 2019-08-13 MED ORDER — LIDOCAINE HCL (PF) 1 % IJ SOLN
INTRAMUSCULAR | Status: AC
  Filled 2019-08-13: qty 5

## 2019-08-13 MED ORDER — IBUPROFEN 600 MG PO TABS
600.0000 mg | ORAL_TABLET | Freq: Once | ORAL | Status: AC
  Administered 2019-08-13: 600 mg via ORAL
  Filled 2019-08-13: qty 1

## 2019-08-13 MED ORDER — MAGNESIUM HYDROXIDE 400 MG/5ML PO SUSP: 30.0000 mL | Freq: Every day | ORAL | Status: DC | PRN

## 2019-08-13 MED ORDER — TRAZODONE HCL 50 MG PO TABS: 50.0000 mg | ORAL_TABLET | Freq: Every evening | ORAL | Status: DC | PRN

## 2019-08-13 MED ORDER — ACETAMINOPHEN 500 MG PO TABS
1000.0000 mg | ORAL_TABLET | Freq: Once | ORAL | Status: AC
  Administered 2019-08-14: 1000 mg via ORAL
  Filled 2019-08-13: qty 2

## 2019-08-13 MED ORDER — SERTRALINE HCL 25 MG PO TABS
25.0000 mg | ORAL_TABLET | Freq: Every day | ORAL | Status: DC
  Administered 2019-08-13 – 2019-08-14 (×2): 25 mg via ORAL
  Filled 2019-08-13 (×2): qty 1
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
## 2019-08-13 NOTE — ED Notes (Signed)
Pt given phone to call her mother.  

## 2019-08-13 NOTE — ED Notes (Signed)
Pt's mother updated with pass code at this time.

## 2019-08-13 NOTE — BHH Group Notes (Signed)
08/13/2019 1:00pm   Type of Therapy and Topic:  Group Therapy:  Change and Accountability  Participation Level:  Minimal  Description of Group In this group, patients discussed power and accountability for change.  The group identified the challenges related to accountability and the difficulty of accepting the outcomes of negative behaviors.  Patients were encouraged to openly discuss a challenge/change they could take responsibility for.  Patients discussed the use of "change talk" and positive thinking as ways to support achievement of personal goals.  The group discussed ways to give support and empowerment to peers.  Therapeutic Goals: 1. Patients will state the relationship between personal power and accountability in the change process 2. Patients will identify the positive and negative consequences of a personal choice they have made 3. Patients will identify one challenge/choice they will take responsibility for making 4. Patients will discuss the role of "change talk" and the impact of positive thinking as it supports successful personal change 5. Patients will verbalize support and affirmation of change efforts in peers  Summary of Patient Progress:  The pt attended the class and participated by completing the handout. The pt was attentive, but quiet.  Therapeutic Modalities Solution Focused Brief Therapy Motivational Interviewing Cognitive Behavioral Therapy    Teresita Madura, MSW, [facility] Clinical Social Worker  08/13/2019 2:41 PM
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
## 2019-08-13 NOTE — ED Notes (Signed)
Pt transferred from Rm 2 to 20 H. Area explained to pt who is understanding. Pt wanded for safety. Pt contracts for safety with this RN.

## 2019-08-13 NOTE — ED Triage Notes (Signed)
Pt reports last TDP at [facility] approx 3 years prior

## 2019-08-13 NOTE — ED Notes (Addendum)
Regan called for update; provided password. She was given info regarding transfer to [facility].

## 2019-08-13 NOTE — ED Notes (Signed)
Pt's parents updated on plan of care at this time.

## 2019-08-13 NOTE — ED Provider Notes (Signed)
[facility] Emergency Department Provider Note  ____________________________________________  Time seen: Approximately 3:17 AM  I have reviewed the triage vital signs and the nursing notes.   HISTORY  Chief Complaint Suicide Attempt   HPI Misty Peters is a 24 y.o. female with a history of bipolar disorder and anxiety who presents for evaluation of a suicide attempt.  Patient reports that she has been under a lot of stress and this evening she tried to kill herself.  First she cut both her wrists.  She said at home for 3 hours and then looked up online to see how long it would take for her to exsanguinate.  The Internet said it could take several days depending on the rate of bleeding.  At that point, she went into her closet and try to hang herself unsuccessfully.  She then decided to tell her brother about it which prompted him  to bring her to the emergency room.  Patient reports feeling very depressed.  She has never been hospitalized for psych in the past.  She does not take any medications.  She denies any drug or alcohol use.    Past Medical History:  Diagnosis Date  . Anxiety   . Bipolar disorder with depression (HCC) 11/07/2015  . Depression   . Headache     Patient Active Problem List   Diagnosis Date Noted  . Migraine 03/13/2018  . Suicidal ideations 02/08/2018  . Encounter for BCP (birth control pills) initial prescription 11/15/2016  . Bipolar disorder with depression (HCC) 11/07/2015  . Depression   . Anxiety     Past Surgical History:  Procedure Laterality Date  . GUM SURGERY    . SALIVARY STONE REMOVAL    . WISDOM TOOTH EXTRACTION      Prior to Admission medications   Medication Sig Start Date End Date Taking? Authorizing Provider  ondansetron (ZOFRAN-ODT) 4 MG disintegrating tablet Take 1 tablet (4 mg total) by mouth every 8 (eight) hours as needed. 03/16/19   Verlee Monte, NP  amitriptyline (ELAVIL) 50 MG tablet Take 1  tablet (50 mg total) by mouth at bedtime. 03/10/18 03/16/19  Particia Nearing, PA-C  Norgestimate-Ethinyl Estradiol Triphasic (ORTHO TRI-CYCLEN LO) 0.18/0.215/0.25 MG-25 MCG tab Take 1 tablet by mouth daily. Patient not taking: Reported on 02/07/2018 11/10/16 03/16/19  Kerman Passey, MD  SUMAtriptan (IMITREX) 50 MG tablet Take 1 tablet (50 mg total) by mouth every 2 (two) hours as needed for migraine. May repeat in 2 hours if headache persists or recurs. 03/10/18 03/16/19  Particia Nearing, PA-C    Allergies Patient has no known allergies.  Family History  Problem Relation Age of Onset  . Hyperlipidemia Father   . Hypertension Father   . Diabetes Father   . Heart disease Paternal Grandmother   . Stroke Paternal Grandmother   . Cancer Paternal Grandmother        lung and breast  . Cancer Maternal Grandmother        breast  . COPD Neg Hx     Social History Social History   Tobacco Use  . Smoking status: Never Smoker  . Smokeless tobacco: Never Used  Substance Use Topics  . Alcohol use: No    Alcohol/week: 0.0 standard drinks  . Drug use: No    Review of Systems  Constitutional: Negative for fever. Eyes: Negative for visual changes. ENT: Negative for sore throat. Neck: No neck pain  Cardiovascular: Negative for chest pain. Respiratory:  Negative for shortness of breath. Gastrointestinal: Negative for abdominal pain, vomiting or diarrhea. Genitourinary: Negative for dysuria. Musculoskeletal: Negative for back pain. Skin: Negative for rash. Neurological: Negative for headaches, weakness or numbness. Psych: + depression and suicide attempt  ____________________________________________   PHYSICAL EXAM:  VITAL SIGNS: ED Triage Vitals  Enc Vitals Group     BP 08/13/19 0153 123/74     Pulse Rate 08/13/19 0153 (!) 105     Resp 08/13/19 0153 18     Temp 08/13/19 0153 98.9 F (37.2 C)     Temp Source 08/13/19 0153 Oral     SpO2 08/13/19 0153 97 %     Weight  08/13/19 0143 150 lb (68 kg)     Height 08/13/19 0143 5\' 2"  (1.575 m)     Head Circumference --      Peak Flow --      Pain Score 08/13/19 0153 6     Pain Loc --      Pain Edu? --      Excl. in GC? --     Constitutional: Alert and oriented. Well appearing and in no apparent distress. HEENT:      Head: Normocephalic and atraumatic.         Eyes: Conjunctivae are normal. Sclera is non-icteric.       Mouth/Throat: Mucous membranes are moist.       Neck: Supple with no signs of meningismus.  There are no bruises or marks of any kind in her neck, there is no C-spine tenderness. Cardiovascular: Regular rate and rhythm.  Respiratory: Normal respiratory effort.  Gastrointestinal: Soft, non tender, and non distended. Musculoskeletal: No edema, cyanosis, or erythema of extremities. Neurologic: Normal speech and language. Face is symmetric. Moving all extremities. No gross focal neurologic deficits are appreciated. Skin: Skin is warm, dry and intact.  Several shallow self-inflicted lacerations on bilateral arms with 1 deeper gaping laceration on the left wrist with no tendon involvement Psychiatric: Mood and affect are depressed. Speech and behavior are normal.  ____________________________________________   LABS (all labs ordered are listed, but only abnormal results are displayed)  Labs Reviewed  CBC WITH DIFFERENTIAL/PLATELET - Abnormal; Notable for the following components:      Result Value   WBC 14.4 (*)    Platelets 470 (*)    Neutro Abs 11.0 (*)    All other components within normal limits  COMPREHENSIVE METABOLIC PANEL - Abnormal; Notable for the following components:   CO2 20 (*)    Glucose, Bld 146 (*)    All other components within normal limits  SALICYLATE LEVEL - Abnormal; Notable for the following components:   Salicylate Lvl <7.0 (*)    All other components within normal limits  ACETAMINOPHEN LEVEL - Abnormal; Notable for the following components:   Acetaminophen  (Tylenol), Serum <10 (*)    All other components within normal limits  ETHANOL  URINE DRUG SCREEN, QUALITATIVE ([facility] ONLY)  POCT PREGNANCY, URINE   ____________________________________________  EKG  none  ____________________________________________  RADIOLOGY  none  ____________________________________________   PROCEDURES  Procedure(s) performed:yes .02/23/21Laceration Repair  Date/Time: 08/13/2019 3:20 AM Performed by: 08/15/2019, MD Authorized by: Nita Sickle, MD   Consent:    Consent obtained:  Verbal   Consent given by:  Patient   Risks discussed:  Infection, pain, retained foreign body, poor cosmetic result and poor wound healing   Alternatives discussed:  Delayed treatment Anesthesia (see MAR for exact dosages):    Anesthesia method:  Local  infiltration   Local anesthetic:  Lidocaine 1% w/o epi Laceration details:    Location:  Hand   Hand location:  L wrist   Length (cm):  8 Repair type:    Repair type:  Simple Exploration:    Hemostasis achieved with:  Direct pressure   Wound exploration: entire depth of wound probed and visualized     Wound extent: no fascia violation noted, no foreign bodies/material noted, no tendon damage noted and no underlying fracture noted     Contaminated: no   Treatment:    Area cleansed with:  Saline and Betadine   Amount of cleaning:  Extensive   Irrigation solution:  Sterile saline   Visualized foreign bodies/material removed: no   Skin repair:    Repair method:  Sutures   Suture size:  6-0   Suture material:  Nylon   Suture technique:  Simple interrupted   Number of sutures:  5 Approximation:    Approximation:  Close Post-procedure details:    Dressing:  Sterile dressing   Patient tolerance of procedure:  Tolerated well, no immediate complications   Critical Care performed:  None ____________________________________________   INITIAL IMPRESSION / ASSESSMENT AND PLAN / ED COURSE  24 y.o. female with  a history of bipolar disorder and anxiety who presents for evaluation of a suicide attempt.  By cutting herself followed by an attempt to hang herself.  Neck examination is normal.  Patient seems depressed.  Laceration was repaired per procedure note above.  Patient was placed under IVC and evaluated by psychiatry who recommended inpatient admission.  Tetanus shot is up-to-date.  Labs for medical clearance have been evaluated.      Please note:  Patient was evaluated in Emergency Department today for the symptoms described in the history of present illness. Patient was evaluated in the context of the global COVID-19 pandemic, which necessitated consideration that the patient might be at risk for infection with the SARS-CoV-2 virus that causes COVID-19. Institutional protocols and algorithms that pertain to the evaluation of patients at risk for COVID-19 are in a state of rapid change based on information released by regulatory bodies including the CDC and federal and state organizations. These policies and algorithms were followed during the patient's care in the ED.  Some ED evaluations and interventions may be delayed as a result of limited staffing during the pandemic.   As part of my medical decision making, I reviewed the following data within the [facility] notes reviewed and incorporated, Labs reviewed , Old chart reviewed, A consult was requested and obtained from this/these consultant(s) psychiatry, Notes from prior ED visits and  Controlled Substance Database   ____________________________________________   FINAL CLINICAL IMPRESSION(S) / ED DIAGNOSES   Final diagnoses:  Severe episode of recurrent major depressive disorder, without psychotic features ([place])  Self-inflicted laceration of left wrist ([place])  Suicide attempt ([place])      NEW MEDICATIONS STARTED DURING THIS VISIT:  ED Discharge Orders    None       Note:  This document was prepared using  Dragon voice recognition software and may include unintentional dictation errors.    Alfred Levins, [place], MD 08/13/19 [phone]
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
## 2019-08-13 NOTE — ED Notes (Signed)
[facility] unable to take report at this time; they are short nurses and will be calling back when they determine if they have appropriate nursing staff levels to accept patient.

## 2019-08-13 NOTE — Progress Notes (Signed)
D: Received patient from [facility] Emergency Department. Patient skin assessment completed with Demetria, RN, skin is intact, besides lacerations to bilateral wrists (see LDA).  No contraband found with all unit prohibited items locked and stored away for discharge. Pt. Was admitted under the services of, Dr. Toni Amend.   A: Patient oriented to unit/room/call light. Pt. Given extensive admissions education. Patient was encourage to participate in unit activities and continue with plan of care being put into place. Q x 15 minute observation checks were initiated for safety.   R: Patient is receptive to treatment  being put into place and safety to be maintained on unit per MD orders.
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
## 2019-08-13 NOTE — ED Notes (Signed)
Pt changes out of gray sports bra, black t-shirt, orange scrunchie, underwear, and multicolored pants into burgundy attire. One cell phone locked up in belongings bag as well at this time.

## 2019-08-13 NOTE — BHH Group Notes (Signed)
BHH Group Notes:  (Nursing/MHT/Case Management/Adjunct)  Date:  08/13/2019  Time:  3:26 PM  Type of Therapy:  Communication Group  Participation Level:  Did Not Attend  Participation Quality:    Affect:    Cognitive:    Insight:    Engagement in Group:    Modes of Intervention:    Summary of Progress/Problems:  Misty Peters 08/13/2019, 3:26 PM

## 2019-08-13 NOTE — Plan of Care (Signed)
Patient just recently admitted to the unit. Patient has not had sufficient time to show progressions at this time. Will continue to monitor for progressions.   Problem: Education: Goal: Emotional status will improve Outcome: Not Progressing Goal: Mental status will improve Outcome: Not Progressing   Problem: Coping: Goal: Ability to verbalize frustrations and anger appropriately will improve Outcome: Not Progressing Goal: Ability to demonstrate self-control will improve Outcome: Not Progressing   Problem: Health Behavior/Discharge Planning: Goal: Compliance with treatment plan for underlying cause of condition will improve Outcome: Not Progressing   Problem: Safety: Goal: Periods of time without injury will increase Outcome: Not Progressing   

## 2019-08-13 NOTE — BH Assessment (Signed)
PATIENT BED AVAILABLE AFTER 9:30AM PENDING NEGATIVE COVID RESULTS  Patient is to be admitted to [facility] by Psychiatric Nurse Practitioner Lerry Liner.  Attending Physician will be Dr. Toni Amend.   Patient has been assigned to room 309, by [facility] Charge Nurse Britta Mccreedy.    ER staff is aware of the admission:  [facility] ER Secretary    Dr. Don Perking, ER MD   Lurena Joiner Patient's Nurse   Nicole Cella Patient Access.

## 2019-08-13 NOTE — Tx Team (Signed)
Initial Treatment Plan 08/13/2019 12:45 PM Jackelin Correia NSQ:[id number]    PATIENT STRESSORS: Other: depression, anxiousness, suicidal thoughts   PATIENT STRENGTHS: Ability for insight Communication skills Physical Health Supportive family/friends   PATIENT IDENTIFIED PROBLEMS: Suicide attempt 08/13/19  Depression 08/13/19                   DISCHARGE CRITERIA:  Motivation to continue treatment in a less acute level of care Need for constant or close observation no longer present Verbal commitment to aftercare and medication compliance  PRELIMINARY DISCHARGE PLAN: Outpatient therapy Participate in family therapy Return to previous living arrangement Return to previous work or school arrangements  PATIENT/FAMILY INVOLVEMENT: This treatment plan has been presented to and reviewed with the patient, Freddy Spadafora.The patient has been given the opportunity to ask questions and make suggestions.  Lenox Ponds, RN 08/13/2019, 12:45 PM

## 2019-08-14 DIAGNOSIS — F4310 Post-traumatic stress disorder, unspecified: Secondary | ICD-10-CM

## 2019-08-14 MED ORDER — SERTRALINE HCL 25 MG PO TABS
50.0000 mg | ORAL_TABLET | Freq: Every day | ORAL | Status: DC
  Administered 2019-08-15 – 2019-08-16 (×2): 50 mg via ORAL
  Filled 2019-08-14 (×2): qty 2

## 2019-08-14 NOTE — Progress Notes (Signed)
D: Patient complained of a migraine early in the shift. Gave Tylenol 650 mg per prn order at 2006. At midnight, patient said her migraine was unrelieved. Called NP and one-time order for Tylenol 1000 mg given. Patient slept and had no further complaints of pain. Denies SI. Contracts for safety. Mood is sad. Affect is congruent. A: continue to monitor for safety R: safety maintained.

## 2019-08-14 NOTE — Plan of Care (Signed)
Patient rated her depression 7/10 and anxiety 8/10 and her goal for today is "eat something."Patient stated that she stops crying and feels little better.Denies SI,HI and AVH.Attended groups.Appetite and energy level fair.Compliant with medications.Support and encouragement given.

## 2019-08-14 NOTE — BHH Group Notes (Signed)
Overcoming Obstacles  08/14/2019 1PM  Type of Therapy and Topic:  Group Therapy:  Overcoming Obstacles  Participation Level:  Minimal    Description of Group:    In this group patients will be encouraged to explore what they see as obstacles to their own wellness and recovery. They will be guided to discuss their thoughts, feelings, and behaviors related to these obstacles. The group will process together ways to cope with barriers, with attention given to specific choices patients can make. Each patient will be challenged to identify changes they are motivated to make in order to overcome their obstacles. This group will be process-oriented, with patients participating in exploration of their own experiences as well as giving and receiving support and challenge from other group members.   Therapeutic Goals: 1. Patient will identify personal and current obstacles as they relate to admission. 2. Patient will identify barriers that currently interfere with their wellness or overcoming obstacles.  3. Patient will identify feelings, thought process and behaviors related to these barriers. 4. Patient will identify two changes they are willing to make to overcome these obstacles:      Summary of Patient Progress Minimal participation during session, pt sat quietly. Pt did work on group activity but no input provided or interaction with group members.    Therapeutic Modalities:   Cognitive Behavioral Therapy Solution Focused Therapy Motivational Interviewing Relapse Prevention Therapy    Lowella Dandy, MSW, LCSW 08/14/2019 1:55 PM

## 2019-08-14 NOTE — BHH Suicide Risk Assessment (Signed)
BHH INPATIENT:  Family/Significant Other Suicide Prevention Education  Suicide Prevention Education:  Patient Refusal for Family/Significant Other Suicide Prevention Education: The patient Misty Peters has refused to provide written consent for family/significant other to be provided Family/Significant Other Suicide Prevention Education during admission and/or prior to discharge.  Physician notified.   SPE completed with pt, as pt refused to consent to family contact. SPI pamphlet provided to pt and pt was encouraged to share information with support network, ask questions, and talk about any concerns relating to SPE. Pt denies access to guns/firearms and verbalized understanding of information provided. Mobile Crisis information also provided to pt.   Charlann Lange [name] MSW LCSW 08/14/2019, 9:54 AM

## 2019-08-14 NOTE — Plan of Care (Signed)
  Problem: Education: Goal: Emotional status will improve Outcome: Progressing Goal: Mental status will improve Outcome: Progressing   Problem: Coping: Goal: Ability to verbalize frustrations and anger appropriately will improve Outcome: Progressing Goal: Ability to demonstrate self-control will improve Outcome: Progressing   

## 2019-08-14 NOTE — Progress Notes (Signed)
Recreation Therapy Notes  Date: 08/14/2019  Time: 9:30 am  Location: Craft Room  Behavioral response: Appropriate   Intervention Topic: Coping Skills   Discussion/Intervention:  Group content on today was focused on coping skills. The group defined what coping skills are and when they can be used. Individuals described how they normally cope with thing and the coping skills they normally use. Patients expressed why it is important to cope with things and how not coping with things can affect you. The group participated in the intervention "Exploring coping skills" where they had a chance to test new coping skills they could use in the future.  Clinical Observations/Feedback:  Patient came to group and defined coping skills as getting through something. 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 Participant expressed that coping skills are important to help with handling things. Individual was social with peers and staff while participating in the intervention.   Doaa Kendzierski LRT/CTRS         Lizania Bouchard 08/14/2019 11:04 AM

## 2019-08-14 NOTE — Progress Notes (Signed)
[facility] MD Progress Note  08/14/2019 2:27 PM Misty Peters  MRN:  [id number] Subjective: Patient seen chart reviewed.  Follow-up for this 24 year old woman who came to the hospital after cutting her wrists and also stating that she intended to hang herself.  History reviewed with patient who appeared to be forthcoming.  She did describes symptoms of depression going back years but with worsening over the past month or so.  Mood feels down and sad pretty much all of the time.  Feels hopeless about her life.  Had active suicidal thoughts and seems to have been planning out and seriously intending to kill herself.  Patient remains very flat and withdrawn and depressed seeming.  Not reporting active psychotic symptoms.  She discusses a little bit her history of trauma which she does not remember in much detail.  Denies recent substance abuse.  Patient has no acute complaints today but continues to be depressed.  She is getting out of her room participates in groups and interacts appropriately. Principal Problem: Posttraumatic stress disorder Diagnosis: Principal Problem:   Posttraumatic stress disorder  Total Time spent with patient: 30 minutes  Past Psychiatric History: Patient claims that she has never had any mental health treatment whatsoever in the past.  Review of the notes in the chart show that her primary care doctor had been concerned about depressive symptoms in the past but the patient claims she never followed up with any of the recommendations.  Past Medical History:  Past Medical History:  Diagnosis Date  . Anxiety   . Bipolar disorder with depression (HCC) 11/07/2015  . Depression   . Headache     Past Surgical History:  Procedure Laterality Date  . GUM SURGERY    . SALIVARY STONE REMOVAL    . WISDOM TOOTH EXTRACTION     Family History:  Family History  Problem Relation Age of Onset  . Hyperlipidemia Father   . Hypertension Father   . Diabetes Father   . Heart disease  Paternal Grandmother   . Stroke Paternal Grandmother   . Cancer Paternal Grandmother        lung and breast  . Cancer Maternal Grandmother        breast  . COPD Neg Hx    Family Psychiatric  History: This is patient says that she has many members of her family especially on her father's side who have depression.  She is unaware of anyone having killed themselves Social History:  Social History   Substance and Sexual Activity  Alcohol Use No  . Alcohol/week: 0.0 standard drinks     Social History   Substance and Sexual Activity  Drug Use No    Social History   Socioeconomic History  . Marital status: Single    Spouse name: Not on file  . Number of children: Not on file  . Years of education: Not on file  . Highest education level: Not on file  Occupational History  . Not on file  Tobacco Use  . Smoking status: Never Smoker  . Smokeless tobacco: Never Used  Substance and Sexual Activity  . Alcohol use: No    Alcohol/week: 0.0 standard drinks  . Drug use: No  . Sexual activity: Not Currently  Other Topics Concern  . Not on file  Social History Narrative  . Not on file   Social Determinants of Health   Financial Resource Strain:   . Difficulty of Paying Living Expenses: Not on file  Food Insecurity:   .  Worried About Programme researcher, broadcasting/film/video in the Last Year: Not on file  . Ran Out of Food in the Last Year: Not on file  Transportation Needs:   . Lack of Transportation (Medical): Not on file  . Lack of Transportation (Non-Medical): Not on file  Physical Activity:   . Days of Exercise per Week: Not on file  . Minutes of Exercise per Session: Not on file  Stress:   . Feeling of Stress : Not on file  Social Connections:   . Frequency of Communication with Friends and Family: Not on file  . Frequency of Social Gatherings with Friends and Family: Not on file  . Attends Religious Services: Not on file  . Active Member of Clubs or Organizations: Not on file  . Attends  Banker Meetings: Not on file  . Marital Status: Not on file   Additional Social History:                         Sleep: Fair  Appetite:  Fair  Current Medications: Current Facility-Administered Medications  Medication Dose Route Frequency Provider Last Rate Last Admin  . acetaminophen (TYLENOL) tablet 650 mg  650 mg Oral Q6H PRN Jearld Lesch, NP   650 mg at 08/13/19 2006  . alum & mag hydroxide-simeth (MAALOX/MYLANTA) 200-200-20 MG/5ML suspension 30 mL  30 mL Oral Q4H PRN Dixon, Rashaun M, NP      . hydrOXYzine (ATARAX/VISTARIL) tablet 25 mg  25 mg Oral TID PRN Dixon, Elray Buba, NP      . magnesium hydroxide (MILK OF MAGNESIA) suspension 30 mL  30 mL Oral Daily PRN Dixon, Elray Buba, NP      . neomycin-bacitracin-polymyxin (NEOSPORIN) ointment packet   Topical BID Antonieta Pert, MD   1 application at 08/14/19 [phone]  . [START ON 08/15/2019] sertraline (ZOLOFT) tablet 50 mg  50 mg Oral Daily Julieanne Hadsall T, MD      . traZODone (DESYREL) tablet 25 mg  25 mg Oral QHS PRN Antonieta Pert, MD        Lab Results:  Results for orders placed or performed during the hospital encounter of 08/13/19 (from the past 48 hour(s))  Urinalysis, Complete w Microscopic     Status: Abnormal   Collection Time: 08/13/19  1:42 AM  Result Value Ref Range   Color, Urine YELLOW (A) YELLOW   APPearance TURBID (A) CLEAR   Specific Gravity, Urine 1.035 (H) 1.005 - 1.030   pH 5.0 5.0 - 8.0   Glucose, UA NEGATIVE NEGATIVE mg/dL   Hgb urine dipstick NEGATIVE NEGATIVE   Bilirubin Urine NEGATIVE NEGATIVE   Ketones, ur NEGATIVE NEGATIVE mg/dL   Protein, ur 30 (A) NEGATIVE mg/dL   Nitrite NEGATIVE NEGATIVE   Leukocytes,Ua NEGATIVE NEGATIVE   RBC / HPF 6-10 0 - 5 RBC/hpf   WBC, UA NONE SEEN 0 - 5 WBC/hpf   Bacteria, UA NONE SEEN NONE SEEN   Squamous Epithelial / LPF 11-20 0 - 5   Mucus PRESENT    Amorphous Crystal PRESENT    Ca Oxalate Crys, UA PRESENT     Comment: Performed  at [facility], [address] [place], [place] [address]    Blood Alcohol level:  Lab Results  Component Value Date   [facility] <10 08/13/2019    Metabolic Disorder Labs: No results found for: HGBA1C, MPG No results found for: PROLACTIN Lab Results  Component Value Date  CHOL 153 02/07/2018   TRIG 107 02/07/2018   HDL 50 (L) 02/07/2018   CHOLHDL 3.1 02/07/2018   LDLCALC 83 02/07/2018   LDLCALC 56 11/07/2015    Physical Findings: AIMS:  , ,  ,  ,    CIWA:    COWS:     Musculoskeletal: Strength & Muscle Tone: within normal limits Gait & Station: normal Patient leans: N/A  Psychiatric Specialty Exam: Physical Exam  Constitutional: She appears well-developed and well-nourished.  HENT:  Head: Normocephalic and atraumatic.  Eyes: Pupils are equal, round, and reactive to light. Conjunctivae are normal.  Cardiovascular: Normal heart sounds.  Respiratory: Effort normal.  GI: Soft.  Musculoskeletal:        General: Normal range of motion.     Cervical back: Normal range of motion.  Neurological: She is alert.  Skin: Skin is warm and dry.     Psychiatric: Her speech is normal. Judgment normal. Her mood appears anxious. Her affect is blunt. She is slowed. She is not agitated and not aggressive. She exhibits a depressed mood. She expresses suicidal ideation. She expresses suicidal plans. She exhibits abnormal remote memory.    Review of Systems  Constitutional: Negative.   HENT: Negative.   Eyes: Negative.   Respiratory: Negative.   Cardiovascular: Negative.   Gastrointestinal: Negative.   Musculoskeletal: Negative.   Skin: Negative.   Neurological: Negative.   Psychiatric/Behavioral: Positive for behavioral problems, decreased concentration, dysphoric mood, self-injury, sleep disturbance and suicidal ideas. The patient is nervous/anxious.     Blood pressure 114/89, pulse 70, temperature 98 F (36.7 C), temperature source Oral, resp. rate 18, height 5\' 2"   (1.575 m), weight 67.1 kg, SpO2 100 %.Body mass index is 27.07 kg/m.  General Appearance: Casual  Eye Contact:  Fair  Speech:  Slow  Volume:  Decreased  Mood:  Depressed and Dysphoric  Affect:  Depressed and Flat  Thought Process:  Goal Directed  Orientation:  Full (Time, Place, and Person)  Thought Content:  Logical  Suicidal Thoughts:  Yes.  with intent/plan  Homicidal Thoughts:  No  Memory:  Immediate;   Fair Recent;   Fair Remote;   Fair  Judgement:  Fair  Insight:  Fair  Psychomotor Activity:  Decreased  Concentration:  Concentration: Fair  Recall:  of Knowledge:  Fair  Language:  Fair  Akathisia:  No  Handed:  Right  AIMS (if indicated):     Assets:  Desire for Improvement Financial Resources/Insurance Housing Physical Health Resilience Social Support  ADL's:  Impaired  Cognition:  WNL  Sleep:  Number of Hours: 5     Treatment Plan Summary: Daily contact with patient to assess and evaluate symptoms and progress in treatment, Medication management and Plan This is a 24 year old woman who made a serious suicide attempt and is describing severe symptoms of depression without psychotic features.  Posttraumatic stress disorder is a possibility although what she seems to be describing currently is more typical of severe depression.  Patient was educated about the treatment of depression including therapy and medicine management.  Plan is to increase the dose of the antidepressant up into the more effective range.  Continue inclusion in groups and activities.  Treatment team will meet tomorrow.  I told the patient I would like to speak to her parents if possible before discharge but we do not need to rush into that.  Likely length of stay probably in the order of 3 to 4 days.  Nain Rudd,  MD 08/14/2019, 2:27 PM

## 2019-08-14 NOTE — Progress Notes (Signed)
Recreation Therapy Notes  INPATIENT RECREATION THERAPY ASSESSMENT  Patient Details Name: Misty Peters MRN: [id number] DOB: 1995/10/31 Today's Date: 08/14/2019       Information Obtained From: Patient  Able to Participate in Assessment/Interview: Yes  Patient Presentation: Responsive  Reason for Admission (Per Patient): Active Symptoms, Suicidal Ideation, Suicide Attempt  Patient Stressors: Family, School  Coping Skills:   Art, Read  Leisure Interests (2+):  Art - Paint, Art - Draw, Individual - Writing, Individual - Reading  Frequency of Recreation/Participation: Weekly  Awareness of Community Resources:     Community Resources:     Current Use:    If no, Barriers?:    Expressed Interest in [employer] Information:    [place] of Residence:  Film/video editor  Patient Main Form of Transportation: Set designer  Patient Strengths:  People person  Patient Identified Areas of Improvement:  like myself more  Patient Goal for Hospitalization:  Being able to manage my emotions  Current SI (including self-harm):  No  Current HI:  No  Current AVH: No  Staff Intervention Plan: Group Attendance, Collaborate with Interdisciplinary Treatment Team  Consent to Intern Participation: N/A  Jasper Hanf 08/14/2019, 11:43 AM

## 2019-08-14 NOTE — [facility] Group Notes (Signed)
[facility] Group Notes:  (Nursing/MHT/Case Management/Adjunct)  Date:  08/14/2019  Time:  11:17 AM  Type of Therapy:  Psychoeducational Skills  Participation Level:  Active  Participation Quality:  Appropriate and Attentive  Affect:  Appropriate  Cognitive:  Alert and Appropriate  Insight:  Appropriate  Engagement in Group:  Engaged  Modes of Intervention:  Discussion, Education, Exploration and Support  Summary of Progress/Problems:  Lynelle Smoke [facility] 08/14/2019, 11:17 AM

## 2019-08-14 NOTE — Progress Notes (Signed)
Patient noted in dayroom watching movies with peers in no distress. Pleasant and cooperative. Denies SI, HI, AVH. No complaints voiced. Reports she is feeling better. Encouragement and support offered. Safety checks maintained. Medications given as prescribed. Pt receptive and remains safe on unit with q 15 min checks.

## 2019-08-14 NOTE — Plan of Care (Signed)
  Problem: Education: Goal: Emotional status will improve Outcome: Progressing Goal: Mental status will improve Outcome: Progressing  D: Patient complained of a migraine early in the shift. Gave Tylenol 650 mg per prn order at 2006. At midnight, patient said her migraine was unrelieved. Called NP and one-time order for Tylenol 1000 mg given. Patient slept and had no further complaints of pain. Denies SI. Contracts for safety. Mood is sad. Affect is congruent. A: continue to monitor for safety R: safety maintained.

## 2019-08-14 NOTE — [facility] Group Notes (Signed)
[facility] Group Notes:  (Nursing/MHT/Case Management/Adjunct)  Date:  08/14/2019  Time:  8:54 AM  Type of Therapy:  Community Meeting  Participation Level:  Active  Participation Quality:  Appropriate  Affect:  Appropriate  Cognitive:  Appropriate  Insight:  Appropriate  Engagement in Group:  Engaged  Modes of Intervention:  Discussion, Education and Support  Summary of Progress/Problems:  Misty Peters [facility] 08/14/2019, 8:54 AM

## 2019-08-14 NOTE — [facility] Group Notes (Signed)
[facility] Group Notes:  (Nursing/MHT/Case Management/Adjunct)  Date:  08/14/2019  Time:  10:15 PM  Type of Therapy:  Group Therapy  Participation Level:  Active  Participation Quality:  Appropriate  Affect:  Appropriate  Cognitive:  Appropriate  Insight:  Appropriate  Engagement in Group:  Engaged  Modes of Intervention:  Discussion  Summary of Progress/Problems:  Burt Ek 08/14/2019, 10:15 PM

## 2019-08-15 NOTE — [facility] Group Notes (Signed)
  LCSW Group Therapy Note  08/15/2019 11:46 AM   Type of Therapy/Topic:  Group Therapy:  Feelings about Diagnosis  Participation Level:  Active   Description of Group:   This group will allow patients to explore their thoughts and feelings about diagnoses they have received. Patients will be guided to explore their level of understanding and acceptance of these diagnoses. Facilitator will encourage patients to process their thoughts and feelings about the reactions of others to their diagnosis and will guide patients in identifying ways to discuss their diagnosis with significant others in their lives. This group will be process-oriented, with patients participating in exploration of their own experiences, giving and receiving support, and processing challenge from other group members.   Therapeutic Goals: 1. Patient will demonstrate understanding of diagnosis as evidenced by identifying two or more symptoms of the disorder 2. Patient will be able to express two feelings regarding the diagnosis 3. Patient will demonstrate their ability to communicate their needs through discussion and/or role play  Summary of Patient Progress: Pt was appropriate and respectful in group. Pt was able to discuss various diagnoses and discuss what she thought some of them meant. Pt reported that she thought antisocial personality disorder was related to people not wanting to be social and that is how people often use the term in public.   Therapeutic Modalities:   Cognitive Behavioral Therapy Brief Therapy Feelings Identification    Iris Pert, MSW, LCSW Clinical Social Work 08/15/2019 11:46 AM

## 2019-08-15 NOTE — [facility] Group Notes (Signed)
[facility] Group Notes:  (Nursing/MHT/Case Management/Adjunct)  Date:  08/15/2019  Time:  9:36 PM  Type of Therapy:  Group Therapy  Participation Level:  Active  Participation Quality:  Appropriate  Affect:  Appropriate  Cognitive:  Appropriate  Insight:  Appropriate  Engagement in Group:  Engaged  Modes of Intervention:  Discussion  Summary of Progress/Problems:  Burt Ek 08/15/2019, 9:36 PM

## 2019-08-15 NOTE — Tx Team (Addendum)
Interdisciplinary Treatment and Diagnostic Plan Update  08/15/2019 Time of Session: 900am Misty Peters MRN: [id number]  Principal Diagnosis: Posttraumatic stress disorder  Secondary Diagnoses: Principal Problem:   Posttraumatic stress disorder   Current Medications:  Current Facility-Administered Medications  Medication Dose Route Frequency Provider Last Rate Last Admin  . acetaminophen (TYLENOL) tablet 650 mg  650 mg Oral Q6H PRN Deloria Lair, NP   650 mg at 08/14/19 1824  . alum & mag hydroxide-simeth (MAALOX/MYLANTA) 200-200-20 MG/5ML suspension 30 mL  30 mL Oral Q4H PRN Misty, Rashaun M, NP      . hydrOXYzine (ATARAX/VISTARIL) tablet 25 mg  25 mg Oral TID PRN Misty, Ernst Bowler, NP      . magnesium hydroxide (MILK OF MAGNESIA) suspension 30 mL  30 mL Oral Daily PRN Misty, Rashaun M, NP      . neomycin-bacitracin-polymyxin (NEOSPORIN) ointment packet   Topical BID Misty Covert, MD   1 application at 60/63/01 1725  . sertraline (ZOLOFT) tablet 50 mg  50 mg Oral Daily Misty, Madie Reno, MD   50 mg at 08/15/19 0758  . traZODone (DESYREL) tablet 25 mg  25 mg Oral QHS PRN Misty Covert, MD   25 mg at 08/14/19 2117   PTA Medications: Medications Prior to Admission  Medication Sig Dispense Refill Last Dose  . acetaminophen (TYLENOL) 500 MG tablet Take 1,000 mg by mouth every 6 (six) hours as needed for headache.       Patient Stressors: Other: depression, anxiousness, suicidal thoughts  Patient Strengths: Ability for insight Communication skills Physical Health Supportive family/friends  Treatment Modalities: Medication Management, Group therapy, Case management,  1 to 1 session with clinician, Psychoeducation, Recreational therapy.   Physician Treatment Plan for Primary Diagnosis: Posttraumatic stress disorder Long Term Goal(s): Improvement in symptoms so as ready for discharge Improvement in symptoms so as ready for discharge   Short Term Goals: Ability to  identify changes in lifestyle to reduce recurrence of condition will improve Ability to verbalize feelings will improve Ability to disclose and discuss suicidal ideas Ability to demonstrate self-control will improve Ability to identify and develop effective coping behaviors will improve Ability to maintain clinical measurements within normal limits will improve Ability to identify changes in lifestyle to reduce recurrence of condition will improve Ability to verbalize feelings will improve Ability to disclose and discuss suicidal ideas Ability to demonstrate self-control will improve Ability to identify and develop effective coping behaviors will improve Ability to maintain clinical measurements within normal limits will improve  Medication Management: Evaluate patient's response, side effects, and tolerance of medication regimen.  Therapeutic Interventions: 1 to 1 sessions, Unit Group sessions and Medication administration.  Evaluation of Outcomes: Progressing  Physician Treatment Plan for Secondary Diagnosis: Principal Problem:   Posttraumatic stress disorder  Long Term Goal(s): Improvement in symptoms so as ready for discharge Improvement in symptoms so as ready for discharge   Short Term Goals: Ability to identify changes in lifestyle to reduce recurrence of condition will improve Ability to verbalize feelings will improve Ability to disclose and discuss suicidal ideas Ability to demonstrate self-control will improve Ability to identify and develop effective coping behaviors will improve Ability to maintain clinical measurements within normal limits will improve Ability to identify changes in lifestyle to reduce recurrence of condition will improve Ability to verbalize feelings will improve Ability to disclose and discuss suicidal ideas Ability to demonstrate self-control will improve Ability to identify and develop effective coping behaviors will improve Ability to maintain  clinical measurements within normal limits will improve     Medication Management: Evaluate patient's response, side effects, and tolerance of medication regimen.  Therapeutic Interventions: 1 to 1 sessions, Unit Group sessions and Medication administration.  Evaluation of Outcomes: Progressing   RN Treatment Plan for Primary Diagnosis: Posttraumatic stress disorder Long Term Goal(s): Knowledge of disease and therapeutic regimen to maintain health will improve  Short Term Goals: Ability to demonstrate self-control, Ability to participate in decision making will improve, Ability to identify and develop effective coping behaviors will improve and Compliance with prescribed medications will improve  Medication Management: RN will administer medications as ordered by provider, will assess and evaluate patient's response and provide education to patient for prescribed medication. RN will report any adverse and/or side effects to prescribing provider.  Therapeutic Interventions: 1 on 1 counseling sessions, Psychoeducation, Medication administration, Evaluate responses to treatment, Monitor vital signs and CBGs as ordered, Perform/monitor CIWA, COWS, AIMS and Fall Risk screenings as ordered, Perform wound care treatments as ordered.  Evaluation of Outcomes: Progressing   LCSW Treatment Plan for Primary Diagnosis: Posttraumatic stress disorder Long Term Goal(s): Safe transition to appropriate next level of care at discharge, Engage patient in therapeutic group addressing interpersonal concerns.  Short Term Goals: Engage patient in aftercare planning with referrals and resources and Increase skills for wellness and recovery  Therapeutic Interventions: Assess for all discharge needs, 1 to 1 time with Social worker, Explore available resources and support systems, Assess for adequacy in community support network, Educate family and significant other(s) on suicide prevention, Complete Psychosocial  Assessment, Interpersonal group therapy.  Evaluation of Outcomes: Progressing   Progress in Treatment: Attending groups: Yes. Participating in groups: Yes. Taking medication as prescribed: Yes. Toleration medication: Yes. Family/Significant other contact made: No, will contact:  pt declined consent Patient understands diagnosis: Yes. Discussing patient identified problems/goals with staff: Yes. Medical problems stabilized or resolved: Yes. Denies suicidal/homicidal ideation: Yes. Issues/concerns per patient self-inventory: No. Other: N/A  New problem(s) identified: No, Describe:  none  New Short Term/Long Term Goal(s): Detox, elimination of AVH/symptoms of psychosis, medication management for mood stabilization; elimination of SI thoughts; development of comprehensive mental wellness/sobriety plan.   Patient Goals:  "Feel like I have places I can go to when I leave here"  Discharge Plan or Barriers: SPE pamphlet, Mobile Crisis information, and AA/NA information provided to patient for additional community support and resources. Pt [facility] been [facility] to [facility].  Reason for Continuation of Hospitalization: Depression Medication stabilization  Estimated Length of Stay: 3-5 days  Recreational Therapy: Patient Stressors: N/A Patient Goal: Patient will engage in groups without prompting or encouragement from LRT x3 group sessions within 5 recreation therapy group sessions  Attendees: Patient: Misty Peters 08/15/2019 11:03 AM  Physician: Dr Toni Amend MD 08/15/2019 11:03 AM  Nursing: Hulan Amato RN 08/15/2019 11:03 AM  RN Care Manager: 08/15/2019 11:03 AM  Social Worker: Zollie Scale Moton LCSW 08/15/2019 11:03 AM  Recreational Therapist: Garret Reddish CTRS LRT 08/15/2019 11:03 AM  Other: Lowella Dandy LCSW  08/15/2019 11:03 AM  Other: Penni Homans LCSW 08/15/2019 11:03 AM  Other: 08/15/2019 11:03 AM    Scribe for Treatment Team: Charlann Lange Moton, LCSW 08/15/2019 11:03 AM

## 2019-08-15 NOTE — Plan of Care (Addendum)
Pt rates depression 4/10, anxiety 6/10 and hopelessness 4/10.  Pt denies SI, HI and AVH. Pt was educated on care plan and verbalizes understanding. Pt was encouraged to attend groups. Torrie Mayers RN Problem: Education: Goal: Emotional status will improve Outcome: Progressing Goal: Mental status will improve Outcome: Progressing   Problem: Coping: Goal: Ability to verbalize frustrations and anger appropriately will improve Outcome: Progressing Goal: Ability to demonstrate self-control will improve Outcome: Progressing   Problem: Health Behavior/Discharge Planning: Goal: Compliance with treatment plan for underlying cause of condition will improve Outcome: Progressing   Problem: Safety: Goal: Periods of time without injury will increase Outcome: Progressing

## 2019-08-15 NOTE — Progress Notes (Signed)
[facility] MD Progress Note  08/15/2019 3:07 PM Misty Peters  MRN:  [id number] Subjective: Follow-up for this 24 year old woman who presented with severe depression.  Patient seen chart reviewed.  Patient tells me she is feeling better today.  She slept reasonably well last night.  She has been participating in groups.  Patient feels like she is more hopeful.  She says she has not had any thoughts about wanting to die or wanting to kill her self today.  Has been cooperative with medicine and tolerated it without any side effects.  Has spoken with her family on the phone and feels like it has been positive and supportive. Principal Problem: Posttraumatic stress disorder Diagnosis: Principal Problem:   Posttraumatic stress disorder  Total Time spent with patient: 30 minutes  Past Psychiatric History: Past history of depression with minimal treatment in the past  Past Medical History:  Past Medical History:  Diagnosis Date  . Anxiety   . Bipolar disorder with depression (HCC) 11/07/2015  . Depression   . Headache     Past Surgical History:  Procedure Laterality Date  . GUM SURGERY    . SALIVARY STONE REMOVAL    . WISDOM TOOTH EXTRACTION     Family History:  Family History  Problem Relation Age of Onset  . Hyperlipidemia Father   . Hypertension Father   . Diabetes Father   . Heart disease Paternal Grandmother   . Stroke Paternal Grandmother   . Cancer Paternal Grandmother        lung and breast  . Cancer Maternal Grandmother        breast  . COPD Neg Hx    Family Psychiatric  History: See previous Social History:  Social History   Substance and Sexual Activity  Alcohol Use No  . Alcohol/week: 0.0 standard drinks     Social History   Substance and Sexual Activity  Drug Use No    Social History   Socioeconomic History  . Marital status: Single    Spouse name: Not on file  . Number of children: Not on file  . Years of education: Not on file  . Highest education  level: Not on file  Occupational History  . Not on file  Tobacco Use  . Smoking status: Never Smoker  . Smokeless tobacco: Never Used  Substance and Sexual Activity  . Alcohol use: No    Alcohol/week: 0.0 standard drinks  . Drug use: No  . Sexual activity: Not Currently  Other Topics Concern  . Not on file  Social History Narrative  . Not on file   Social Determinants of Health   Financial Resource Strain:   . Difficulty of Paying Living Expenses: Not on file  Food Insecurity:   . Worried About Programme researcher, broadcasting/film/video in the Last Year: Not on file  . Ran Out of Food in the Last Year: Not on file  Transportation Needs:   . Lack of Transportation (Medical): Not on file  . Lack of Transportation (Non-Medical): Not on file  Physical Activity:   . Days of Exercise per Week: Not on file  . Minutes of Exercise per Session: Not on file  Stress:   . Feeling of Stress : Not on file  Social Connections:   . Frequency of Communication with Friends and Family: Not on file  . Frequency of Social Gatherings with Friends and Family: Not on file  . Attends Religious Services: Not on file  . Active Member of Clubs  or Organizations: Not on file  . Attends Banker Meetings: Not on file  . Marital Status: Not on file   Additional Social History:                         Sleep: Fair  Appetite:  Fair  Current Medications: Current Facility-Administered Medications  Medication Dose Route Frequency Provider Last Rate Last Admin  . acetaminophen (TYLENOL) tablet 650 mg  650 mg Oral Q6H PRN Jearld Lesch, NP   650 mg at 08/15/19 1152  . alum & mag hydroxide-simeth (MAALOX/MYLANTA) 200-200-20 MG/5ML suspension 30 mL  30 mL Oral Q4H PRN Dixon, Rashaun M, NP      . hydrOXYzine (ATARAX/VISTARIL) tablet 25 mg  25 mg Oral TID PRN Dixon, Elray Buba, NP      . magnesium hydroxide (MILK OF MAGNESIA) suspension 30 mL  30 mL Oral Daily PRN Dixon, Rashaun M, NP      .  neomycin-bacitracin-polymyxin (NEOSPORIN) ointment packet   Topical BID Antonieta Pert, MD   1 application at 08/14/19 1725  . sertraline (ZOLOFT) tablet 50 mg  50 mg Oral Daily Jezlyn Westerfield, Jackquline Denmark, MD   50 mg at 08/15/19 0758  . traZODone (DESYREL) tablet 25 mg  25 mg Oral QHS PRN Antonieta Pert, MD   25 mg at 08/14/19 2117    Lab Results: No results found for this or any previous visit (from the past 48 hour(s)).  Blood Alcohol level:  Lab Results  Component Value Date   ETH <10 08/13/2019    Metabolic Disorder Labs: No results found for: HGBA1C, MPG No results found for: PROLACTIN Lab Results  Component Value Date   CHOL 153 02/07/2018   TRIG 107 02/07/2018   HDL 50 (L) 02/07/2018   CHOLHDL 3.1 02/07/2018   LDLCALC 83 02/07/2018   LDLCALC 56 11/07/2015    Physical Findings: AIMS:  , ,  ,  ,    CIWA:    COWS:     Musculoskeletal: Strength & Muscle Tone: within normal limits Gait & Station: normal Patient leans: N/A  Psychiatric Specialty Exam: Physical Exam  Nursing note and vitals reviewed. Constitutional: She appears well-developed and well-nourished.  HENT:  Head: Normocephalic and atraumatic.  Eyes: Pupils are equal, round, and reactive to light. Conjunctivae are normal.  Cardiovascular: Regular rhythm and normal heart sounds.  Respiratory: Effort normal. No respiratory distress.  GI: Soft.  Musculoskeletal:        General: Normal range of motion.     Cervical back: Normal range of motion.  Neurological: She is alert.  Skin: Skin is warm and dry.  Psychiatric: Her speech is normal and behavior is normal. Judgment and thought content normal. Her affect is blunt. Cognition and memory are normal. She expresses no suicidal ideation.    Review of Systems  Constitutional: Negative.   HENT: Negative.   Eyes: Negative.   Respiratory: Negative.   Cardiovascular: Negative.   Gastrointestinal: Negative.   Musculoskeletal: Negative.   Skin: Negative.    Neurological: Negative.   Psychiatric/Behavioral: Positive for dysphoric mood. Negative for agitation, behavioral problems, confusion, decreased concentration, hallucinations and suicidal ideas. The patient is not hyperactive.     Blood pressure 107/75, pulse 84, temperature 98.7 F (37.1 C), temperature source Oral, resp. rate 18, height 5\' 2"  (1.575 m), weight 67.1 kg, SpO2 97 %.Body mass index is 27.07 kg/m.  General Appearance: Casual  Eye Contact:  Good  Speech:  Clear and Coherent  Volume:  Normal  Mood:  Euthymic  Affect:  Congruent  Thought Process:  Goal Directed  Orientation:  Full (Time, Place, and Person)  Thought Content:  Logical  Suicidal Thoughts:  No  Homicidal Thoughts:  No  Memory:  Immediate;   Fair Recent;   Fair Remote;   Fair  Judgement:  Fair  Insight:  Fair  Psychomotor Activity:  Normal  Concentration:  Concentration: Fair  Recall:  [employer] of Knowledge:  Fair  Language:  Fair  Akathisia:  No  Handed:  Right  AIMS (if indicated):     Assets:  Desire for Improvement Housing Physical Health Social Support  ADL's:  Intact  Cognition:  WNL  Sleep:  Number of Hours: 8     Treatment Plan Summary: Daily contact with patient to assess and evaluate symptoms and progress in treatment, Medication management and Plan Reviewed treatment plan including medication and therapy.  Supportive therapy with the patient.  No indication to change medicine today.  Treatment team saw her this morning.  Patient will be reassessed tomorrow for discharge planning.  Alethia Berthold, MD 08/15/2019, 3:07 PM

## 2019-08-15 NOTE — [facility] Group Notes (Signed)
[facility] Group Notes:  (Nursing/MHT/Case Management/Adjunct)  Date:  08/15/2019  Time:  3:03 PM  Type of Therapy:  Psychoeducational Skills  Participation Level:  Active  Participation Quality:  Appropriate and Attentive  Affect:  Appropriate  Cognitive:  Appropriate  Insight:  Good  Engagement in Group:  Engaged  Modes of Intervention:  Discussion, Education, Problem-solving and Socialization  Summary of Progress/Problems:  Misty Peters A Misty Peters 08/15/2019, 3:03 PM

## 2019-08-15 NOTE — Progress Notes (Signed)
Recreation Therapy Notes  Date: 08/15/2019  Time: 9:30 am  Location: Craft Room  Behavioral response: Appropriate   Intervention Topic: Communication    Discussion/Intervention:  Group content today was focused on communication. The group defined communication and ways to communicate with others. Individuals stated reason why communication is important and some reasons to communicate with others. Patients expressed if they thought they were good at communicating with others and ways they could improve their communication skills. The group identified important parts of communication and some experiences they have had in the past with communication. The group participated in the intervention "What is that?", where they had a chance to test out their communication skills and identify ways to improve their communication techniques.  Clinical Observations/Feedback:  Patient came to group and expressed that communication can be done by talking, listening to music and reading peoples tones. She explained that communication allows her to express herself and connect with others. Individual was social with peers and staff while participating in the intervention.   Misty Peters LRT/CTRS          Misty Peters 08/15/2019 11:07 AM

## 2019-08-15 NOTE — [facility] Group Notes (Signed)
[facility] Group Notes:  (Nursing/MHT/Case Management/Adjunct)  Date:  08/15/2019  Time:  10:12 AM  Type of Therapy:  Community Group   Participation Level:  Active  Participation Quality:  Appropriate  Affect:  Appropriate  Cognitive:  Appropriate  Insight:  Appropriate and Good  Engagement in Group:  Supportive  Modes of Intervention:  Discussion and Orientation  Summary of Progress/Problems:  Hulda Marin 08/15/2019, 10:12 AM

## 2019-08-15 NOTE — Progress Notes (Signed)
   08/15/19 1400  Clinical Encounter Type  Visited With Patient  Visit Type Follow-up;Spiritual support;Behavioral Health  Referral From Chaplain  Consult/Referral To Chaplain  Chaplain conducted a group on anxiety and pressure. Patient actively participated in the group.n Patient says she would like to learn more coping mechanisms. Music, especially singing helps patient relieve stress. Patient says that she also writes songs. She enjoys writing, therefore after the group Chaplain gave journals to all who wanted one. Chaplain offered pastoral presence and empathy. Chaplain gained helpful information from patient.

## 2019-08-16 MED ORDER — TRAZODONE HCL 50 MG PO TABS: 25.0000 mg | ORAL_TABLET | Freq: Every evening | ORAL | 1 refills | Status: DC | PRN

## 2019-08-16 MED ORDER — SERTRALINE HCL 50 MG PO TABS: 50.0000 mg | ORAL_TABLET | Freq: Every day | ORAL | 1 refills | Status: DC

## 2019-08-16 NOTE — Plan of Care (Signed)
Patient stated she was feeling better and hopeful she will be discharging soon   Problem: Education: Goal: Emotional status will improve Outcome: Progressing Goal: Mental status will improve Outcome: Progressing

## 2019-08-16 NOTE — [facility] Group Notes (Signed)
LCSW Group Therapy Note  08/16/2019 2:03 PM  Type of Therapy/Topic:  Group Therapy:  Emotion Regulation  Participation Level:  Active   Description of Group:   The purpose of this group is to assist patients in learning to regulate negative emotions and experience positive emotions. Patients will be guided to discuss ways in which they have been vulnerable to their negative emotions. These vulnerabilities will be juxtaposed with experiences of positive emotions or situations, and patients will be challenged to use positive emotions to combat negative ones. Special emphasis will be placed on coping with negative emotions in conflict situations, and patients will process healthy conflict resolution skills.  Therapeutic Goals: 1. Patient will identify two positive emotions or experiences to reflect on in order to balance out negative emotions 2. Patient will label two or more emotions that they find the most difficult to experience 3. Patient will demonstrate positive conflict resolution skills through discussion and/or role plays  Summary of Patient Progress: Patient was presnt in group.  Patient was supportive of other group members.  Patient shared how she uses music, giving her pets a bath and wallflowers with lavender scent as a coping skills to decrease her anxiety or feelings of being overwhelmed.   Therapeutic Modalities:   Cognitive Behavioral Therapy Feelings Identification Dialectical Behavioral Therapy  Penni Homans, MSW, LCSW 08/16/2019 2:03 PM

## 2019-08-16 NOTE — Progress Notes (Signed)
Patient rates depression 3/10, anxiety 5/10 and denies SI/HI/AVH. Patient observed to be interacting appropriately with staff and peers on the unit. Pt compliant with medication administration per MD orders. Patient given education. Patient given support and encouragement to be active in her treatment plan. Patient being monitored Q 15 minutes for safety per unit protocol. Patient remains safe on the unit.

## 2019-08-16 NOTE — Discharge Summary (Signed)
Physician Discharge Summary Note  Patient:  Misty Peters is an 24 y.o., female MRN:  [id number] DOB:  04-09-1996 Patient phone:  [phone] (home)  Patient address:   [address] [place] [address],  Total Time spent with patient: 30 minutes  Date of Admission:  08/13/2019 Date of Discharge: 08/16/2019  Reason for Admission: Patient was admitted because of reports of suicidal ideation and suicide attempt with multiple symptoms of depression  Principal Problem: MDD (major depressive disorder) Discharge Diagnoses: Principal Problem:   MDD (major depressive disorder) Active Problems:   Posttraumatic stress disorder   Past Psychiatric History: Past history of some attempts at treatment by primary care doctor but no previous hospitalizations and had not taken medication or engaged in treatment in the past  Past Medical History:  Past Medical History:  Diagnosis Date  . Anxiety   . Bipolar disorder with depression (HCC) 11/07/2015  . Depression   . Headache     Past Surgical History:  Procedure Laterality Date  . GUM SURGERY    . SALIVARY STONE REMOVAL    . WISDOM TOOTH EXTRACTION     Family History:  Family History  Problem Relation Age of Onset  . Hyperlipidemia Father   . Hypertension Father   . Diabetes Father   . Heart disease Paternal Grandmother   . Stroke Paternal Grandmother   . Cancer Paternal Grandmother        lung and breast  . Cancer Maternal Grandmother        breast  . COPD Neg Hx    Family Psychiatric  History: Some history of anxiety and depression in the family Social History:  Social History   Substance and Sexual Activity  Alcohol Use No  . Alcohol/week: 0.0 standard drinks     Social History   Substance and Sexual Activity  Drug Use No    Social History   Socioeconomic History  . Marital status: Single    Spouse name: Not on file  . Number of children: Not on file  . Years of education: Not on file  . Highest education  level: Not on file  Occupational History  . Not on file  Tobacco Use  . Smoking status: Never Smoker  . Smokeless tobacco: Never Used  Substance and Sexual Activity  . Alcohol use: No    Alcohol/week: 0.0 standard drinks  . Drug use: No  . Sexual activity: Not Currently  Other Topics Concern  . Not on file  Social History Narrative  . Not on file   Social Determinants of Health   Financial Resource Strain:   . Difficulty of Paying Living Expenses: Not on file  Food Insecurity:   . Worried About Programme researcher, broadcasting/film/video in the Last Year: Not on file  . Ran Out of Food in the Last Year: Not on file  Transportation Needs:   . Lack of Transportation (Medical): Not on file  . Lack of Transportation (Non-Medical): Not on file  Physical Activity:   . Days of Exercise per Week: Not on file  . Minutes of Exercise per Session: Not on file  Stress:   . Feeling of Stress : Not on file  Social Connections:   . Frequency of Communication with Friends and Family: Not on file  . Frequency of Social Gatherings with Friends and Family: Not on file  . Attends Religious Services: Not on file  . Active Member of Clubs or Organizations: Not on file  .  Attends Archivist Meetings: Not on file  . Marital Status: Not on file    Hospital Course: Patient was cooperative with treatment.  15-minute checks were maintained.  She did not engage in any dangerous behavior in the hospital.  She was cooperative and forthcoming about symptoms.  Patient attended groups and interacted appropriately.  She was started on sertraline 50 mg a day.  Low-dose as needed trazodone has been used for sleep.  Patient tolerated medicine well.  At this time she is reporting that she feels much more stable and her mood is better.  She has spoken with her family and feels supported.  She allowed me to speak to her family this afternoon and I talked to her mother on the phone.  Mother has a solid plan for keeping an eye on the  patient and feels safe and comfortable with her coming home.  Patient will be referred for outpatient follow-up therapy and medication management.  Physical Findings: AIMS:  , ,  ,  ,    CIWA:    COWS:     Musculoskeletal: Strength & Muscle Tone: within normal limits Gait & Station: normal Patient leans: N/A  Psychiatric Specialty Exam: Physical Exam  Nursing note and vitals reviewed. Constitutional: She appears well-developed and well-nourished.  HENT:  Head: Normocephalic and atraumatic.  Eyes: Pupils are equal, round, and reactive to light. Conjunctivae are normal.  Cardiovascular: Normal heart sounds.  Respiratory: Effort normal.  GI: Soft.  Musculoskeletal:        General: Normal range of motion.     Cervical back: Normal range of motion.  Neurological: She is alert.  Skin: Skin is warm and dry.  Psychiatric: She has a normal mood and affect. Her behavior is normal. Judgment and thought content normal.    Review of Systems  Constitutional: Negative.   HENT: Negative.   Eyes: Negative.   Respiratory: Negative.   Cardiovascular: Negative.   Gastrointestinal: Negative.   Musculoskeletal: Negative.   Skin: Negative.   Neurological: Negative.   Psychiatric/Behavioral: Negative.     Blood pressure 128/85, pulse 90, temperature 98 F (36.7 C), temperature source Oral, resp. rate 19, height 5\' 2"  (1.575 m), weight 67.1 kg, SpO2 99 %.Body mass index is 27.07 kg/m.  General Appearance: Casual  Eye Contact:  Good  Speech:  Clear and Coherent  Volume:  Normal  Mood:  Euthymic  Affect:  Congruent  Thought Process:  Goal Directed  Orientation:  Full (Time, Place, and Person)  Thought Content:  Logical  Suicidal Thoughts:  No  Homicidal Thoughts:  No  Memory:  Immediate;   Fair Recent;   Fair Remote;   Fair  Judgement:  Fair  Insight:  Fair  Psychomotor Activity:  Normal  Concentration:  Concentration: Fair  Recall:  [employer] of Knowledge:  Fair  Language:   Fair  Akathisia:  No  Handed:  Right  AIMS (if indicated):     Assets:  Desire for Improvement Financial Resources/Insurance Housing Physical Health Resilience Social Support  ADL's:  Intact  Cognition:  WNL  Sleep:  Number of Hours: 7     Have you used any form of tobacco in the last 30 days? (Cigarettes, Smokeless Tobacco, Cigars, and/or Pipes): No  Has this patient used any form of tobacco in the last 30 days? (Cigarettes, Smokeless Tobacco, Cigars, and/or Pipes) Yes, No  Blood Alcohol level:  Lab Results  Component Value Date   ETH <10 08/13/2019  Metabolic Disorder Labs:  No results found for: HGBA1C, MPG No results found for: PROLACTIN Lab Results  Component Value Date   CHOL 153 02/07/2018   TRIG 107 02/07/2018   HDL 50 (L) 02/07/2018   CHOLHDL 3.1 02/07/2018   LDLCALC 83 02/07/2018   LDLCALC 56 11/07/2015    See Psychiatric Specialty Exam and Suicide Risk Assessment completed by Attending Physician prior to discharge.  Discharge destination:  Home  Is patient on multiple antipsychotic therapies at discharge:  No   Has Patient had three or more failed trials of antipsychotic monotherapy by history:  No  Recommended Plan for Multiple Antipsychotic Therapies: NA  Discharge Instructions    Diet - low sodium heart healthy   Complete by: As directed    Increase activity slowly   Complete by: As directed      Allergies as of 08/16/2019   No Known Allergies     Medication List    STOP taking these medications   acetaminophen 500 MG tablet Commonly known as: TYLENOL     TAKE these medications     Indication  sertraline 50 MG tablet Commonly known as: ZOLOFT Take 1 tablet (50 mg total) by mouth daily. Start taking on: August 17, 2019  Indication: Major Depressive Disorder   traZODone 50 MG tablet Commonly known as: DESYREL Take 0.5 tablets (25 mg total) by mouth at bedtime as needed for sleep.  Indication: Trouble Sleeping       Follow-up Information    [facility] Follow up.   Specialty: Behavioral Health Why: You have teletherapy with Aurther Loft on 08/23/19 at 11am and telepsychiatry with Dr Michae Kava on 08/26/19 at 1pm. They will contact you as the visits are virtual. Thank You! Contact information: [address] [facility] [place] [address] [phone]          Follow-up recommendations:  Activity:  Activity as tolerated Diet:  Regular diet Other:  Continue current medicine.  Follow-up with outpatient therapy and medicine management.  Comments: Prescriptions provided at discharge  Signed: Mordecai Rasmussen, MD 08/16/2019, 12:37 PM

## 2019-08-16 NOTE — Progress Notes (Signed)
  [facility] Adult Case Management Discharge Plan :  Will you be returning to the same living situation after discharge:  Yes,  home At discharge, do you have transportation home?: Yes,  pts family will pick her up Do you have the ability to pay for your medications: Yes,  mental health  Release of information consent forms completed and in the chart;  Patient to Follow up at: Follow-up Information    [facility] Follow up.   Specialty: Behavioral Health Why: You have teletherapy with Aurther Loft on 08/23/19 at 11am and telepsychiatry with Dr Michae Kava on 08/26/19 at 1pm. They will contact you as the visits are virtual. Thank You! Contact information: [address] [facility] [place] [address] [phone]          Next level of care provider has access to [facility] Link:yes  Safety Planning and Suicide Prevention discussed: Yes,  SPE completed with pt as pt declined collateral contact  Have you used any form of tobacco in the last 30 days? (Cigarettes, Smokeless Tobacco, Cigars, and/or Pipes): No  Has patient been referred to the Quitline?: Patient refused referral  Patient has been referred for addiction treatment: N/A  Mechele Dawley, LCSW 08/16/2019, 1:06 PM

## 2019-08-16 NOTE — Progress Notes (Signed)
Recreation Therapy Notes   Date: 08/16/2019  Time: 9:30 am  Location: Craft Room  Behavioral response: Appropriate   Intervention Topic: Self-esteem    Discussion/Intervention:  Group content today was focused on self-esteem. Patient defined self-esteem and where it comes form. The group described reasons self-esteem is important. Individuals stated things that impact self-esteem and positive ways to improve self-esteem. The group participated in the intervention "Collage of Me" where patients were able to create a collage of positive things that makes them who they are. Clinical Observations/Feedback:  Patient came to the last ten minutes of group.    Genia Perin LRT/CTRS         Misty Peters 08/16/2019 12:34 PM 

## 2019-08-16 NOTE — Plan of Care (Signed)
  Problem: Education: Goal: Emotional status will improve Outcome: Adequate for Discharge Goal: Mental status will improve Outcome: Adequate for Discharge   Problem: Coping: Goal: Ability to verbalize frustrations and anger appropriately will improve Outcome: Adequate for Discharge Goal: Ability to demonstrate self-control will improve Outcome: Adequate for Discharge   Problem: Health Behavior/Discharge Planning: Goal: Compliance with treatment plan for underlying cause of condition will improve Outcome: Adequate for Discharge   Problem: Safety: Goal: Periods of time without injury will increase Outcome: Adequate for Discharge

## 2019-08-16 NOTE — Plan of Care (Signed)
  Problem: Self-Esteem Goal: STG - Patient will identify 3 positive ways of expressing themselves within 5 recreation therapy group sessions Description: STG - Patient will identify 3 positive ways of expressing themselves within 5 recreation therapy group sessions Outcome: Completed/Met   

## 2019-08-16 NOTE — Progress Notes (Signed)
Recreation Therapy Notes  INPATIENT RECREATION TR PLAN  Patient Details Name: Misty Peters MRN: [id number] DOB: 1996/04/08 Today's Date: 08/16/2019  Rec Therapy Plan Is patient appropriate for Therapeutic Recreation?: Yes Treatment times per week: at least 3 Estimated Length of Stay: 5-7 days TR Treatment/Interventions: Group participation (Comment)  Discharge Criteria Pt will be discharged from therapy if:: Discharged  Discharge Summary Short term goals set: Patient will identify 3 positive ways of expressing themselves within 5 recreation therapy group sessions Short term goals met: Complete Progress toward goals comments: Groups attended Which groups?: Communication, Self-esteem, Coping skills Reason goals not met: N/A Therapeutic equipment acquired: N/A Reason patient discharged from therapy: Discharge from hospital Pt/family agrees with progress & goals achieved: Yes Date patient discharged from therapy: 08/16/19   [name] 08/16/2019, 3:38 PM

## 2019-08-16 NOTE — [facility] Group Notes (Signed)
[facility] Group Notes:  (Nursing/MHT/Case Management/Adjunct)  Date:  08/16/2019  Time:  8:56 AM  Type of Therapy:  Community Meeting   Participation Level:  Active  Participation Quality:  Appropriate and Attentive  Affect:  Appropriate  Cognitive:  Alert and Appropriate  Insight:  None  Engagement in Group:  Engaged and Supportive  Modes of Intervention:  Discussion and Orientation  Summary of Progress/Problems:  Hulda Marin 08/16/2019, 8:56 AM

## 2019-08-16 NOTE — Progress Notes (Signed)
D: Patient is aware of  Discharge this shift .   A:Patient denies suicidal /homicidal ideations. Patient received all belongings brought in  No Storage medications. Writer reviewed Discharge Summary, Suicide Risk Assessment, and Transitional Record. Patient also received Prescriptions   from  MD.   Writer instructed on discharge criteria    R:Patient  Discharge  With  No concerns  With family

## 2019-08-16 NOTE — [facility] Suicide Risk Assessment (Signed)
[facility] Discharge Suicide Risk Assessment   Principal Problem: MDD (major depressive disorder) Discharge Diagnoses: Principal Problem:   MDD (major depressive disorder) Active Problems:   Posttraumatic stress disorder   Total Time spent with patient: 30 minutes  Musculoskeletal: Strength & Muscle Tone: within normal limits Gait & Station: normal Patient leans: N/A  Psychiatric Specialty Exam: Review of Systems  Constitutional: Negative.   HENT: Negative.   Eyes: Negative.   Respiratory: Negative.   Cardiovascular: Negative.   Gastrointestinal: Negative.   Musculoskeletal: Negative.   Skin: Negative.   Neurological: Negative.   Psychiatric/Behavioral: Negative.     Blood pressure 128/85, pulse 90, temperature 98 F (36.7 C), temperature source Oral, resp. rate 19, height 5\' 2"  (1.575 m), weight 67.1 kg, SpO2 99 %.Body mass index is 27.07 kg/m.  General Appearance: Casual  Eye Contact::  Good  Speech:  Clear and Coherent409  Volume:  Normal  Mood:  Euthymic  Affect:  Congruent  Thought Process:  Coherent  Orientation:  Full (Time, Place, and Person)  Thought Content:  Logical  Suicidal Thoughts:  No  Homicidal Thoughts:  No  Memory:  Immediate;   Fair Recent;   Fair Remote;   Fair  Judgement:  Fair  Insight:  Fair  Psychomotor Activity:  Normal  Concentration:  Fair  Recall:  [email or web address] of Knowledge:Fair  Language: Fair  Akathisia:  No  Handed:  Right  AIMS (if indicated):     Assets:  Communication Skills Desire for Improvement Housing Physical Health Resilience Social Support  Sleep:  Number of Hours: 7  Cognition: WNL  ADL's:  Intact   Mental Status Per Nursing Assessment::   On Admission:  Self-harm thoughts  Demographic Factors:  Adolescent or young adult  Loss Factors: NA  Historical Factors: NA  Risk Reduction Factors:   Sense of responsibility to family, Living with another person, especially a relative, Positive social support,  Positive therapeutic relationship and Positive coping skills or problem solving skills  Continued Clinical Symptoms:  Depression:   Anhedonia  Cognitive Features That Contribute To Risk:  None    Suicide Risk:  Minimal: No identifiable suicidal ideation.  Patients presenting with no risk factors but with morbid ruminations; may be classified as minimal risk based on the severity of the depressive symptoms  Follow-up Information    [facility] Follow up.   Specialty: Behavioral Health Why: You have teletherapy with [email or web address] on 08/23/19 at 11am and telepsychiatry with Dr 10/23/19 on 08/26/19 at 1pm. They will contact you as the visits are virtual. Thank You! Contact information: [address] [facility] [place] [phone]          Plan Of Care/Follow-up recommendations:  Activity:  Activity as tolerated Diet:  Regular diet Other:  Continue current medication.  Follow-up with outpatient mental health care.  [phone], MD 08/16/2019, 12:30 PM

## 2019-08-23 ENCOUNTER — Ambulatory Visit (INDEPENDENT_AMBULATORY_CARE_PROVIDER_SITE_OTHER): Payer: No Typology Code available for payment source | Admitting: Clinical

## 2019-08-23 ENCOUNTER — Other Ambulatory Visit: Payer: Self-pay

## 2019-08-23 DIAGNOSIS — F3130 Bipolar disorder, current episode depressed, mild or moderate severity, unspecified: Secondary | ICD-10-CM
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
## 2019-08-26 ENCOUNTER — Encounter (HOSPITAL_COMMUNITY): Payer: Self-pay | Admitting: Psychiatry

## 2019-08-26 ENCOUNTER — Other Ambulatory Visit: Payer: Self-pay

## 2019-08-26 ENCOUNTER — Ambulatory Visit (HOSPITAL_COMMUNITY): Payer: No Typology Code available for payment source | Admitting: Psychiatry

## 2019-08-26 DIAGNOSIS — F5105 Insomnia due to other mental disorder: Secondary | ICD-10-CM

## 2019-08-26 DIAGNOSIS — F411 Generalized anxiety disorder: Secondary | ICD-10-CM

## 2019-08-26 DIAGNOSIS — F4310 Post-traumatic stress disorder, unspecified: Secondary | ICD-10-CM

## 2019-08-26 DIAGNOSIS — F332 Major depressive disorder, recurrent severe without psychotic features: Secondary | ICD-10-CM

## 2019-08-26 MED ORDER — SERTRALINE HCL 100 MG PO TABS: 100.0000 mg | ORAL_TABLET | Freq: Every day | ORAL | 1 refills | Status: DC
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
## 2019-08-30 ENCOUNTER — Other Ambulatory Visit: Payer: Self-pay

## 2019-08-30 ENCOUNTER — Ambulatory Visit (INDEPENDENT_AMBULATORY_CARE_PROVIDER_SITE_OTHER): Payer: No Typology Code available for payment source | Admitting: Licensed Clinical Social Worker

## 2019-08-30 ENCOUNTER — Encounter: Payer: Self-pay | Admitting: Licensed Clinical Social Worker

## 2019-08-30 DIAGNOSIS — F332 Major depressive disorder, recurrent severe without psychotic features: Secondary | ICD-10-CM | POA: Diagnosis not present

## 2019-08-30 DIAGNOSIS — F314 Bipolar disorder, current episode depressed, severe, without psychotic features: Secondary | ICD-10-CM

## 2019-08-30 NOTE — Progress Notes (Signed)
Virtual Visit via Video Note  I connected with Misty Peters on 08/30/19 at  9:00 AM EST by a video enabled telemedicine application and verified that I am speaking with the correct person using two identifiers.   I discussed the limitations of evaluation and management by telemedicine and the availability of in person appointments. The patient expressed understanding and agreed to proceed.   THERAPY PROGRESS NOTE  Session Time: 64 Minutes  Participation Level: Active  Behavioral Response: CasualAlertDepressed  Type of Therapy: Individual Therapy  Treatment Goals addressed: Coping and Diagnosis: Bipolar affective disorder, depressed, severe  Interventions: CBT  Summary: Misty Peters is a 24 y.o. female who presents w/ sxs of depression.  Pt reported her biggest stressor that triggered recent suicide attempt was school and the need to be perfect. Pt reported since taking a break from school and having her parents ease up on expectations, stress level has reduced. However, pt identified not knowing what to do with herself during this break and continues to feel unmotivated to engage in previously enjoyed activities. Pt reported having no friends and desire to feel more in control of her life. Pt did not endorse current SI.    Suicidal/Homicidal: No  Therapist Response: Therapist met with patient as new referral for outpatient counseling. Therapist and patient reviewed treatment plan and goals completed during her [facility] last week with another therapist. Pt was in agreement. Therapist encouraged patient to identify stressors as well as needs for support and hope for the future. Pt was receptive. Therapist provided support and validation of pt's feelings/concerns. Therapist assessed patient for self-harm/safety concerns.   Plan: Return again in 1 week.  Diagnosis: Axis I: Bipolar, Depressed    Axis II: N/A    [name], LCSW, LCAS 08/30/2019

## 2019-09-06 ENCOUNTER — Other Ambulatory Visit: Payer: Self-pay

## 2019-09-06 ENCOUNTER — Ambulatory Visit (INDEPENDENT_AMBULATORY_CARE_PROVIDER_SITE_OTHER): Payer: No Typology Code available for payment source | Admitting: Licensed Clinical Social Worker

## 2019-09-06 ENCOUNTER — Encounter: Payer: Self-pay | Admitting: Licensed Clinical Social Worker

## 2019-09-06 DIAGNOSIS — F314 Bipolar disorder, current episode depressed, severe, without psychotic features: Secondary | ICD-10-CM | POA: Diagnosis not present

## 2019-09-06 NOTE — Progress Notes (Signed)
Virtual Visit via Video Note  I connected with Misty Peters on 09/06/19 at  2:00 PM EDT by a video enabled telemedicine application and verified that I am speaking with the correct person using two identifiers.   I discussed the limitations of evaluation and management by telemedicine and the availability of in person appointments. The patient expressed understanding and agreed to proceed.  THERAPY PROGRESS NOTE  Session Time: 1 Minutes  Participation Level: Active  Behavioral Response: CasualAlertDepressed  Type of Therapy: Individual Therapy  Treatment Goals addressed: Anxiety and Coping  Interventions: CBT  Summary: Misty Peters is a 24 y.o. female who presents with depression sxs. Pt reported somewhat improved mood this week, however still lacking motivation to engage in pleasant activities. Pt reported that she has joined her father on a business trip out of state and is currently staying in a hotel for a couple of days. Pt reported parents thought it would be beneficial if she got out of the house for a while. Pt reported desire to be more independent and identified where she would like to see herself in a few years. Pt provided realistic steps she can take to increase her independence and engagement, including saving money to get her own place, what kind of jobs she can do and taking her time to decide whether she wants to finish school at this time or move on. Pt reported she has until the fall to decide about plans for school and feels supported by her family in whatever decision she makes.  Suicidal/Homicidal: No  Therapist Response: Therapist met with patient for follow up session. Therapist and patient discussed takeaways from last session and goal setting for patient to re-engage in a structured routine and pleasant activities. Pt in agreement. Therapist encouraged patient to identify most ideal living situation and what that would look like. Pt was receptive.  Therapist and patient discussed smaller steps to help patient realize this vision and assessed feasibility of plan.   Plan: Return again in 1 week.  Diagnosis: Axis I: Bipolar, Depressed    Axis II: N/A    [name], LCSW, LCAS 09/06/2019

## 2019-09-13 ENCOUNTER — Ambulatory Visit: Payer: Self-pay | Admitting: Licensed Clinical Social Worker

## 2019-09-16 ENCOUNTER — Encounter: Payer: Self-pay | Admitting: Family Medicine

## 2019-09-20 ENCOUNTER — Ambulatory Visit: Payer: No Typology Code available for payment source | Admitting: Licensed Clinical Social Worker

## 2019-09-20 ENCOUNTER — Encounter: Payer: Self-pay | Admitting: Family Medicine

## 2019-09-20 ENCOUNTER — Other Ambulatory Visit: Payer: Self-pay

## 2019-09-20 ENCOUNTER — Ambulatory Visit (INDEPENDENT_AMBULATORY_CARE_PROVIDER_SITE_OTHER): Payer: BC Managed Care – PPO | Admitting: Family Medicine

## 2019-09-20 VITALS — BP 105/70 | HR 72 | Temp 98.4°F | Ht 61.0 in | Wt 153.0 lb

## 2019-09-20 DIAGNOSIS — F332 Major depressive disorder, recurrent severe without psychotic features: Secondary | ICD-10-CM | POA: Diagnosis not present

## 2019-09-20 DIAGNOSIS — F419 Anxiety disorder, unspecified: Secondary | ICD-10-CM

## 2019-09-20 DIAGNOSIS — R45851 Suicidal ideations: Secondary | ICD-10-CM

## 2019-09-20 DIAGNOSIS — Z111 Encounter for screening for respiratory tuberculosis: Secondary | ICD-10-CM

## 2019-09-20 DIAGNOSIS — Z0000 Encounter for general adult medical examination without abnormal findings: Secondary | ICD-10-CM | POA: Diagnosis not present

## 2019-09-20 LAB — UA/M W/RFLX CULTURE, ROUTINE
Bilirubin, UA: NEGATIVE
Glucose, UA: NEGATIVE
Ketones, UA: NEGATIVE
Leukocytes,UA: NEGATIVE
Nitrite, UA: NEGATIVE
Specific Gravity, UA: 1.02 (ref 1.005–1.030)
Urobilinogen, Ur: 1 mg/dL (ref 0.2–1.0)
pH, UA: 7 (ref 5.0–7.5)

## 2019-09-20 LAB — MICROSCOPIC EXAMINATION: WBC, UA: NONE SEEN /hpf (ref 0–5)

## 2019-09-20 NOTE — Progress Notes (Signed)
BP 105/70   Pulse 72   Temp 98.4 F (36.9 C) (Oral)   Ht 5\' 1"  (1.549 m)   Wt 153 lb (69.4 kg)   SpO2 98%   BMI 28.91 kg/m    Subjective:    Patient ID: Misty Peters, female    DOB: [birth year], 24 y.o.   MRN: [id number]  HPI: Misty Peters is a 24 y.o. female presenting on 09/20/2019 for comprehensive medical examination. Current medical complaints include:see below  Recently hospitalized for depression, bipolar disorder, PTSD and suicide attempt. Now following outpatient with Psychiatry and on zoloft and trazodone regimen. States the medication is helping some but still having passive suicidal ideation. Has f/u with Psychiatry next week. No plan to harm herself at this point, has resources if needed.   She currently lives with: Menopausal Symptoms: no  Depression Screen done today and results listed below:  Depression screen [facility] 2/9 09/20/2019 03/10/2018 02/07/2018 11/10/2016 11/07/2015  Decreased Interest 3 3 3  0 3  Down, Depressed, Hopeless 3 3 3  0 3  PHQ - 2 Score 6 6 6  0 6  Altered sleeping 1 3 3  - 3  Tired, decreased energy 2 3 3  - 3  Change in appetite 0 2 0 - 3  Feeling bad or failure about yourself  2 2 0 - 0  Trouble concentrating 1 2 0 - 2  Moving slowly or fidgety/restless 0 3 0 - 2  Suicidal thoughts 3 2 3  - 0  PHQ-9 Score 15 23 15  - 19  Difficult doing work/chores - - Not difficult at all - Somewhat difficult    The patient does not have a history of falls. I did complete a risk assessment for falls. A plan of care for falls was documented.   Past Medical History:  Past Medical History:  Diagnosis Date  . Anxiety   . Bipolar disorder with depression ([place]) 11/07/2015  . Depression   . Headache     Surgical History:  Past Surgical History:  Procedure Laterality Date  . GUM SURGERY    . SALIVARY STONE REMOVAL    . WISDOM TOOTH EXTRACTION      Medications:  Current Outpatient Medications on File Prior to Visit  Medication Sig  . sertraline  (ZOLOFT) 100 MG tablet Take 1 tablet (100 mg total) by mouth daily.  . traZODone (DESYREL) 50 MG tablet Take 0.5 tablets (25 mg total) by mouth at bedtime as needed for sleep.  . [DISCONTINUED] amitriptyline (ELAVIL) 50 MG tablet Take 1 tablet (50 mg total) by mouth at bedtime.  . [DISCONTINUED] Norgestimate-Ethinyl Estradiol Triphasic (ORTHO TRI-CYCLEN LO) 0.18/0.215/0.25 MG-25 MCG tab Take 1 tablet by mouth daily. (Patient not taking: Reported on 02/07/2018)  . [DISCONTINUED] SUMAtriptan (IMITREX) 50 MG tablet Take 1 tablet (50 mg total) by mouth every 2 (two) hours as needed for migraine. May repeat in 2 hours if headache persists or recurs.   No current facility-administered medications on file prior to visit.    Allergies:  No Known Allergies  Social History:  Social History   Socioeconomic History  . Marital status: Single    Spouse name: Not on file  . Number of children: 0  . Years of education: 65  . Highest education level: 10th grade  Occupational History  . Not on file  Tobacco Use  . Smoking status: Never Smoker  . Smokeless tobacco: Never Used  Substance and Sexual Activity  . Alcohol use: No    Alcohol/week: 0.0  standard drinks  . Drug use: No  . Sexual activity: Not Currently  Other Topics Concern  . Not on file  Social History Narrative   Born and raised in [place] by both parents   1 older brother   Never married and no kids      Lives with parents and brother in [place]   Social Determinants of Health   Financial Resource Strain:   . Difficulty of Paying Living Expenses:   Food Insecurity:   . Worried About Programme researcher, broadcasting/film/video in the Last Year:   . Barista in the Last Year:   Transportation Needs:   . Freight forwarder (Medical):   Marland Kitchen Lack of Transportation (Non-Medical):   Physical Activity:   . Days of Exercise per Week:   . Minutes of Exercise per Session:   Stress:   . Feeling of Stress :   Social Connections:   . Frequency of  Communication with Friends and Family:   . Frequency of Social Gatherings with Friends and Family:   . Attends Religious Services:   . Active Member of Clubs or Organizations:   . Attends Banker Meetings:   Marland Kitchen Marital Status:   Intimate Partner Violence:   . Fear of Current or Ex-Partner:   . Emotionally Abused:   Marland Kitchen Physically Abused:   . Sexually Abused:    Social History   Tobacco Use  Smoking Status Never Smoker  Smokeless Tobacco Never Used   Social History   Substance and Sexual Activity  Alcohol Use No  . Alcohol/week: 0.0 standard drinks    Family History:  Family History  Problem Relation Age of Onset  . Hyperlipidemia Father   . Hypertension Father   . Diabetes Father   . Heart disease Paternal Grandmother   . Stroke Paternal Grandmother   . Cancer Paternal Grandmother        lung and breast  . Bipolar disorder Paternal Grandmother   . ADD / ADHD Paternal Grandmother   . Cancer Maternal Grandmother        breast  . Bipolar disorder Paternal Aunt   . COPD Neg Hx     Past medical history, surgical history, medications, allergies, family history and social history reviewed with patient today and changes made to appropriate areas of the chart.   Review of Systems - General ROS: negative Psychological ROS: negative Ophthalmic ROS: negative ENT ROS: negative Allergy and Immunology ROS: negative Hematological and Lymphatic ROS: negative Endocrine ROS: negative Breast ROS: negative for breast lumps Respiratory ROS: no cough, shortness of breath, or wheezing Cardiovascular ROS: no chest pain or dyspnea on exertion Gastrointestinal ROS: no abdominal pain, change in bowel habits, or black or bloody stools Genito-Urinary ROS: no dysuria, trouble voiding, or hematuria Musculoskeletal ROS: negative Neurological ROS: no TIA or stroke symptoms Dermatological ROS: negative All other ROS negative except what is listed above and in the HPI.        Objective:    BP 105/70   Pulse 72   Temp 98.4 F (36.9 C) (Oral)   Ht 5\' 1"  (1.549 m)   Wt 153 lb (69.4 kg)   SpO2 98%   BMI 28.91 kg/m   Wt Readings from Last 3 Encounters:  09/20/19 153 lb (69.4 kg)  08/13/19 148 lb (67.1 kg)  08/13/19 150 lb (68 kg)    Physical Exam Vitals and nursing note reviewed.  Constitutional:      General: She is  not in acute distress.    Appearance: She is well-developed.  HENT:     Head: Atraumatic.     Right Ear: External ear normal.     Left Ear: External ear normal.     Nose: Nose normal.     Mouth/Throat:     Pharynx: No oropharyngeal exudate.  Eyes:     General: No scleral icterus.    Conjunctiva/sclera: Conjunctivae normal.     Pupils: Pupils are equal, round, and reactive to light.  Neck:     Thyroid: No thyromegaly.  Cardiovascular:     Rate and Rhythm: Normal rate and regular rhythm.     Heart sounds: Normal heart sounds.  Pulmonary:     Effort: Pulmonary effort is normal. No respiratory distress.     Breath sounds: Normal breath sounds.  Chest:     Breasts:        Right: No mass, skin change or tenderness.        Left: No mass, skin change or tenderness.  Abdominal:     General: Bowel sounds are normal.     Palpations: Abdomen is soft. There is no mass.     Tenderness: There is no abdominal tenderness.  Musculoskeletal:        General: No tenderness. Normal range of motion.     Cervical back: Normal range of motion and neck supple.  Lymphadenopathy:     Cervical: No cervical adenopathy.  Skin:    General: Skin is warm and dry.     Findings: No rash.  Neurological:     Mental Status: She is alert and oriented to person, place, and time.     Cranial Nerves: No cranial nerve deficit.  Psychiatric:        Behavior: Behavior normal.     Results for orders placed or performed in visit on 09/20/19  Microscopic Examination   URINE  Result Value Ref Range   WBC, UA None seen 0 - 5 /hpf   RBC 3-10 (A) 0 - 2 /hpf    Epithelial Cells (non renal) 0-10 0 - 10 /hpf   Mucus, UA Present Not Estab.   Bacteria, UA Few (A) None seen/Few  UA/M w/rflx Culture, Routine   Specimen: Urine   URINE  Result Value Ref Range   Specific Gravity, UA 1.020 1.005 - 1.030   pH, UA 7.0 5.0 - 7.5   Color, UA Yellow Yellow   Appearance Ur Clear Clear   Leukocytes,UA Negative Negative   Protein,UA Trace (A) Negative/Trace   Glucose, UA Negative Negative   Ketones, UA Negative Negative   RBC, UA 3+ (A) Negative   Bilirubin, UA Negative Negative   Urobilinogen, Ur 1.0 0.2 - 1.0 mg/dL   Nitrite, UA Negative Negative   Microscopic Examination See below:       Assessment & Plan:   Problem List Items Addressed This Visit      Other   Anxiety    Followed by Psychiatry now, working on med adjustments. Continue per their recommendations      Suicidal ideations    Passive at this time, no intent to harm self and has resources if worsening. F/u with Psychiatry as scheduled      MDD (major depressive disorder) - Primary    Followed by Psychiatry now, working on med adjustments. Continue per their recommendations       Other Visit Diagnoses    Annual physical exam       Relevant Orders  CBC with Differential/Platelet   Comprehensive metabolic panel   Lipid Panel w/o Chol/HDL Ratio   TSH   UA/M w/rflx Culture, Routine (Completed)   Screening for tuberculosis       Has forms for work, completed all but emotional health clearance portions. Pt aware Psychiatry will need to complete remainder. Await quantiferon testing   Relevant Orders   QuantiFERON-TB Gold Plus       Follow up plan: No follow-ups on file.   LABORATORY TESTING:  - Pap smear: up to date  IMMUNIZATIONS:   - Tdap: Tetanus vaccination status reviewed: last tetanus booster within 10 years.     PATIENT COUNSELING:   Advised to take 1 mg of folate supplement per day if capable of pregnancy.   Sexuality: Discussed sexually transmitted  diseases, partner selection, use of condoms, avoidance of unintended pregnancy  and contraceptive alternatives.   Advised to avoid cigarette smoking.  I discussed with the patient that most people either abstain from alcohol or drink within safe limits (<=14/week and <=4 drinks/occasion for males, <=7/weeks and <= 3 drinks/occasion for females) and that the risk for alcohol disorders and other health effects rises proportionally with the number of drinks per week and how often a drinker exceeds daily limits.  Discussed cessation/primary prevention of drug use and availability of treatment for abuse.   Diet: Encouraged to adjust caloric intake to maintain  or achieve ideal body weight, to reduce intake of dietary saturated fat and total fat, to limit sodium intake by avoiding high sodium foods and not adding table salt, and to maintain adequate dietary potassium and calcium preferably from fresh fruits, vegetables, and low-fat dairy products.    stressed the importance of regular exercise  Injury prevention: Discussed safety belts, safety helmets, smoke detector, smoking near bedding or upholstery.   Dental health: Discussed importance of regular tooth brushing, flossing, and dental visits.    NEXT PREVENTATIVE PHYSICAL DUE IN 1 YEAR. No follow-ups on file.

## 2019-09-20 NOTE — Assessment & Plan Note (Signed)
Passive at this time, no intent to harm self and has resources if worsening. F/u with Psychiatry as scheduled

## 2019-09-20 NOTE — Assessment & Plan Note (Signed)
Followed by Psychiatry now, working on med adjustments. Continue per their recommendations

## 2019-09-20 NOTE — Assessment & Plan Note (Signed)
Followed by Psychiatry now, working on med adjustments. Continue per their recommendations 

## 2019-09-22 LAB — CBC WITH DIFFERENTIAL/PLATELET
Basophils Absolute: 0 10*3/uL (ref 0.0–0.2)
Basos: 1 %
EOS (ABSOLUTE): 0.2 10*3/uL (ref 0.0–0.4)
Eos: 3 %
Hematocrit: 35.3 % (ref 34.0–46.6)
Hemoglobin: 11.9 g/dL (ref 11.1–15.9)
Immature Grans (Abs): 0 10*3/uL (ref 0.0–0.1)
Immature Granulocytes: 0 %
Lymphocytes Absolute: 2.9 10*3/uL (ref 0.7–3.1)
Lymphs: 35 %
MCH: 29 pg (ref 26.6–33.0)
MCHC: 33.7 g/dL (ref 31.5–35.7)
MCV: 86 fL (ref 79–97)
Monocytes Absolute: 0.6 10*3/uL (ref 0.1–0.9)
Monocytes: 7 %
Neutrophils Absolute: 4.7 10*3/uL (ref 1.4–7.0)
Neutrophils: 54 %
Platelets: 428 10*3/uL (ref 150–450)
RBC: 4.1 x10E6/uL (ref 3.77–5.28)
RDW: 12.8 % (ref 11.7–15.4)
WBC: 8.5 10*3/uL (ref 3.4–10.8)

## 2019-09-22 LAB — QUANTIFERON-TB GOLD PLUS
QuantiFERON Mitogen Value: >10 IU/mL
QuantiFERON Nil Value: 0.04 IU/mL
QuantiFERON TB1 Ag Value: 0.05 IU/mL
QuantiFERON TB2 Ag Value: 0.05 IU/mL
QuantiFERON-TB Gold Plus: NEGATIVE

## 2019-09-22 LAB — COMPREHENSIVE METABOLIC PANEL
ALT: 27 IU/L (ref 0–32)
AST: 28 IU/L (ref 0–40)
Albumin/Globulin Ratio: 1.7 (ref 1.2–2.2)
Albumin: 4.1 g/dL (ref 3.9–5.0)
Alkaline Phosphatase: 78 IU/L (ref 39–117)
BUN/Creatinine Ratio: 15 (ref 9–23)
BUN: 10 mg/dL (ref 6–20)
Bilirubin Total: <0.2 mg/dL (ref 0.0–1.2)
CO2: 22 mmol/L (ref 20–29)
Calcium: 9.1 mg/dL (ref 8.7–10.2)
Chloride: 105 mmol/L (ref 96–106)
Creatinine, Ser: 0.67 mg/dL (ref 0.57–1.00)
GFR calc Af Amer: 143 mL/min/{1.73_m2} (ref >59)
GFR calc non Af Amer: 124 mL/min/{1.73_m2} (ref >59)
Globulin, Total: 2.4 g/dL (ref 1.5–4.5)
Glucose: 83 mg/dL (ref 65–99)
Potassium: 4.3 mmol/L (ref 3.5–5.2)
Sodium: 140 mmol/L (ref 134–144)
Total Protein: 6.5 g/dL (ref 6.0–8.5)

## 2019-09-22 LAB — LIPID PANEL W/O CHOL/HDL RATIO
Cholesterol, Total: 163 mg/dL (ref 100–199)
HDL: 41 mg/dL (ref >39)
LDL Chol Calc (NIH): 72 mg/dL (ref 0–99)
Triglycerides: 316 mg/dL — ABNORMAL HIGH (ref 0–149)
VLDL Cholesterol Cal: 50 mg/dL — ABNORMAL HIGH (ref 5–40)

## 2019-09-22 LAB — TSH: TSH: 2.96 u[IU]/mL (ref 0.450–4.500)

## 2019-09-27 ENCOUNTER — Other Ambulatory Visit: Payer: Self-pay

## 2019-09-27 ENCOUNTER — Ambulatory Visit (INDEPENDENT_AMBULATORY_CARE_PROVIDER_SITE_OTHER): Payer: No Typology Code available for payment source | Admitting: Psychiatry

## 2019-09-27 DIAGNOSIS — F3130 Bipolar disorder, current episode depressed, mild or moderate severity, unspecified: Secondary | ICD-10-CM

## 2019-09-27 NOTE — Progress Notes (Signed)
Writer connected with patient today for her appointment through video enabled platform.  5 minutes into the session the call dropped.  Writer attempted to call patient several times, left several messages and also sent link again to connect by video.  However patient did not respond.  Sent communication to front desk as well as Nurse, adult.

## 2019-10-02 ENCOUNTER — Ambulatory Visit: Payer: Self-pay | Admitting: Licensed Clinical Social Worker

## 2019-10-07 ENCOUNTER — Ambulatory Visit: Payer: No Typology Code available for payment source | Attending: Oncology

## 2019-10-07 DIAGNOSIS — Z23 Encounter for immunization: Secondary | ICD-10-CM

## 2019-10-07 NOTE — Progress Notes (Signed)
   Covid-19 Vaccination Clinic  Name:  [name]    MRN: [id number] DOB: Jun 11, 1996  10/07/2019  [name] was observed post Covid-19 immunization for 15 minutes without incident. She was provided with Vaccine Information Sheet and instruction to access the V-Safe system.   [name] was instructed to call 911 with any severe reactions post vaccine: Marland Kitchen Difficulty breathing  . Swelling of face and throat  . A fast heartbeat  . A bad rash all over body  . Dizziness and weakness   Immunizations Administered    Name Date Dose VIS Date Route   Pfizer COVID-19 Vaccine 10/07/2019 10:08 AM 0.3 mL 06/02/2019 Intramuscular   Manufacturer: [employer], [employer]   Lot: [id number]   NDC: [address]-6

## 2019-11-01 ENCOUNTER — Ambulatory Visit: Payer: No Typology Code available for payment source | Attending: Internal Medicine

## 2019-11-01 ENCOUNTER — Other Ambulatory Visit: Payer: Self-pay

## 2019-11-01 DIAGNOSIS — Z23 Encounter for immunization: Secondary | ICD-10-CM

## 2019-11-01 NOTE — Progress Notes (Signed)
   Covid-19 Vaccination Clinic  Name:  Natesha Hassey    MRN: [id number] DOB: 1996/05/15  11/01/2019  Ms. Hukill was observed post Covid-19 immunization for 15 minutes without incident. She was provided with Vaccine Information Sheet and instruction to access the V-Safe system.   Ms. Lacerda was instructed to call 911 with any severe reactions post vaccine: Marland Kitchen Difficulty breathing  . Swelling of face and throat  . A fast heartbeat  . A bad rash all over body  . Dizziness and weakness   Immunizations Administered    Name Date Dose VIS Date Route   Pfizer COVID-19 Vaccine 11/01/2019  8:10 AM 0.3 mL 08/16/2018 Intramuscular   Manufacturer: [employer], [employer]   Lot: [id number]   NDC: [address]-6

## 2019-11-04 ENCOUNTER — Other Ambulatory Visit (HOSPITAL_COMMUNITY): Payer: Self-pay | Admitting: Psychiatry

## 2019-11-04 DIAGNOSIS — F4310 Post-traumatic stress disorder, unspecified: Secondary | ICD-10-CM

## 2019-11-04 DIAGNOSIS — F411 Generalized anxiety disorder: Secondary | ICD-10-CM

## 2019-11-04 DIAGNOSIS — F332 Major depressive disorder, recurrent severe without psychotic features: Secondary | ICD-10-CM

## 2019-11-08 ENCOUNTER — Telehealth (HOSPITAL_COMMUNITY): Payer: Self-pay

## 2019-11-08 NOTE — Telephone Encounter (Signed)
Patient called requesting a refill on her Sertraline 100mg  to be sent to [facility] on [facility] [facility] in [place]. She stated that she took her last one yesterday. Thank you.

## 2019-11-08 NOTE — Telephone Encounter (Signed)
Please let patient know I cannot fill her medications without evaluating her . Please try to get her in to Saturday clinic if she needs meds or please ask her to talk to her primary care provider.

## 2019-11-17 ENCOUNTER — Encounter: Payer: Self-pay | Admitting: Psychiatry

## 2019-11-17 ENCOUNTER — Other Ambulatory Visit: Payer: Self-pay

## 2019-11-17 ENCOUNTER — Telehealth (INDEPENDENT_AMBULATORY_CARE_PROVIDER_SITE_OTHER): Payer: BC Managed Care – PPO | Admitting: Psychiatry

## 2019-11-17 DIAGNOSIS — Z79899 Other long term (current) drug therapy: Secondary | ICD-10-CM | POA: Diagnosis not present

## 2019-11-17 DIAGNOSIS — F4310 Post-traumatic stress disorder, unspecified: Secondary | ICD-10-CM | POA: Diagnosis not present

## 2019-11-17 DIAGNOSIS — F3162 Bipolar disorder, current episode mixed, moderate: Secondary | ICD-10-CM

## 2019-11-17 MED ORDER — HYDROXYZINE HCL 25 MG PO TABS: 12.5000 mg | ORAL_TABLET | Freq: Two times a day (BID) | ORAL | 0 refills | Status: DC | PRN

## 2019-11-17 MED ORDER — LITHIUM CARBONATE 300 MG PO TABS: 300.0000 mg | ORAL_TABLET | Freq: Two times a day (BID) | ORAL | 1 refills | Status: DC

## 2019-11-17 MED ORDER — TRAZODONE HCL 50 MG PO TABS: 25.0000 mg | ORAL_TABLET | Freq: Every evening | ORAL | 1 refills | Status: DC | PRN

## 2019-11-17 MED ORDER — SERTRALINE HCL 100 MG PO TABS: 100.0000 mg | ORAL_TABLET | Freq: Every day | ORAL | 1 refills | Status: DC

## 2019-11-17 NOTE — Patient Instructions (Signed)
Lithium tablets or capsules What is this medicine? LITHIUM (LITH ee um) is used to prevent and treat the manic episodes caused by manic-depressive illness. This medicine may be used for other purposes; ask your health care provider or pharmacist if you have questions. COMMON BRAND NAME(S): Eskalith What should I tell my health care provider before I take this medicine? They need to know if you have any of these conditions:  active infection  breathing problems  Brugada Syndrome  dehydration (diarrhea or sweating)  diet low in salt  heart disease  high levels of calcium in the blood  history of irregular heartbeat  kidney disease  low level of potassium or sodium in the blood  parathyroid disease  problems urinating  thyroid disease  an unusual or allergic reaction to lithium, other medicines, foods, dyes, or preservatives  pregnant or trying to get pregnant  breast-feeding How should I use this medicine? Take this medicine by mouth with a glass of water. Follow the directions on the prescription label. Take after a meal or snack to avoid stomach upset. Take your doses at regular intervals. Do not take your medicine more often than directed. The amount of this medicine you take is very important. Taking more than the prescribed dose can cause serious side effects. Do not stop taking except on the advice of your doctor or health care professional. Talk to your pediatrician regarding the use of this medicine in children. Special care may be needed. While this drug may be prescribed for children as young as 7 years for selected conditions, precautions do apply. Overdosage: If you think you have taken too much of this medicine contact a poison control center or emergency room at once. NOTE: This medicine is only for you. Do not share this medicine with others. What if I miss a dose? If you miss a dose, take it as soon as you can. If it is almost time for your next dose, take  only that dose. Do not take double or extra doses. What may interact with this medicine? Do not take this medicine with any of the following medications:  cisapride  dronedarone  pimozide  thioridazine This medicine may also interact with the following medications:  buspirone  caffeine  calcium iodide  carbamazepine  certain medicines for depression, anxiety, or psychotic disturbances  certain medicines for high blood pressure  certain medicines for migraine headache like almotriptan, eletriptan, frovatriptan, naratriptan, rizatriptan, sumatriptan, zolmitriptan  diuretics  fentanyl  linezolid  MAOIs like Carbex, Eldepryl, Marplan, Nardil, and Parnate  medicines that relax muscles for surgery  metronidazole  NSAIDs, medicines for pain and inflammation, like ibuprofen or naproxen  other medicines that prolong the QT interval (cause an abnormal heart rhythm) like dofetilide  phenytoin  potassium iodide, KI  sodium bicarbonate  sodium chloride  St. John's Wort  theophylline  tramadol  tryptophan  urea This list may not describe all possible interactions. Give your health care provider a list of all the medicines, herbs, non-prescription drugs, or dietary supplements you use. Also tell them if you smoke, drink alcohol, or use illegal drugs. Some items may interact with your medicine. What should I watch for while using this medicine? Visit your doctor or health care professional for regular checks on your progress. It can take several weeks of treatment before you start to get better. The amount of salt (sodium) in your body influences the effects of this medicine, and this medicine can increase salt loss from the body. Eat   a normal diet that includes salt. Do not change to salt substitutes. Avoid changes involving diet, or medications that include large amounts of sodium like sodium bicarbonate. Ask your doctor or health care professional for advice if you  are not sure. Drink plenty of fluids while you are taking this medicine. Avoid drinks that contain caffeine, such as coffee, tea and colas. You will need extra fluids if you have diarrhea or sweat a lot. This will help prevent toxic effects from this medicine. Be careful not to get overheated during exercise, saunas, hot baths, and hot weather. Consult your doctor or health care professional if you have a high fever or persistent diarrhea. You may get drowsy or dizzy. Do not drive, use machinery, or do anything that needs mental alertness until you know how this medicine affects you. Do not stand or sit up quickly, especially if you are an older patient. This reduces the risk of dizzy or fainting spells. What side effects may I notice from receiving this medicine? Side effects that you should report to your doctor or health care professional as soon as possible:  allergic reactions like skin rash, itching or hives, swelling of the face, lips, or tongue  breathing problems  changes in emotions or moods  changes in vision  fingers or toes feel numb, cool, painful  hallucinations, loss of contact with reality  increased thirst  increased urination  mild tremor (hands)  persistent headache with or without blurred vision  signs and symptoms of a dangerous change in heartbeat or heart rhythm like chest pain; dizziness; fast, irregular heartbeat; palpitations; feeling faint or lightheaded; falls; breathing problems  signs and symptoms of lithium toxicity like diarrhea, vomiting, increased tremor, loss of balance or coordination, drowsiness, ringing of the ears, muscle weakness or twitching, slurred speech, confusion, seizures  unusually weak or tired Side effects that usually do not require medical attention (report to your doctor or health care professional if they continue or are bothersome):  decreased appetite  mild thirst  nausea  stomach upset  tiredness This list may not  describe all possible side effects. Call your doctor for medical advice about side effects. You may report side effects to FDA at 1-800-FDA-1088. Where should I keep my medicine? Keep out of the reach of children. Store at room temperature between 15 and 30 degrees C (59 and 86 degrees F). Throw away any unused medicine after the expiration date. NOTE: This sheet is a summary. It may not cover all possible information. If you have questions about this medicine, talk to your doctor, pharmacist, or health care provider.  2020 Elsevier/Gold Standard (2018-08-03 11:12:18)  
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
## 2019-11-21 ENCOUNTER — Telehealth: Payer: Self-pay

## 2019-11-21 NOTE — Telephone Encounter (Signed)
ekg order mailed

## 2019-11-21 NOTE — Telephone Encounter (Signed)
message was left for patient that ekg was mailed and that labwork orders were faxed to [facility]

## 2019-11-21 NOTE — Telephone Encounter (Signed)
labwork order faxed and confirmed to [facility] lab.

## 2019-12-07 ENCOUNTER — Encounter: Payer: Self-pay | Admitting: Psychiatry

## 2019-12-07 ENCOUNTER — Other Ambulatory Visit: Payer: Self-pay

## 2019-12-07 ENCOUNTER — Telehealth (INDEPENDENT_AMBULATORY_CARE_PROVIDER_SITE_OTHER): Payer: BC Managed Care – PPO | Admitting: Psychiatry

## 2019-12-07 DIAGNOSIS — F4310 Post-traumatic stress disorder, unspecified: Secondary | ICD-10-CM

## 2019-12-07 DIAGNOSIS — Z79899 Other long term (current) drug therapy: Secondary | ICD-10-CM | POA: Diagnosis not present

## 2019-12-07 DIAGNOSIS — F3162 Bipolar disorder, current episode mixed, moderate: Secondary | ICD-10-CM | POA: Insufficient documentation

## 2019-12-07 MED ORDER — HYDROXYZINE HCL 25 MG PO TABS: 12.5000 mg | ORAL_TABLET | Freq: Two times a day (BID) | ORAL | 1 refills | Status: DC | PRN

## 2019-12-07 NOTE — Progress Notes (Signed)
Provider Location : [facility] Patient Location : [place]  Virtual Visit via Video Note  I connected with Misty Peters on 12/07/19 at 11:45 AM EDT by a video enabled telemedicine application and verified that I am speaking with the correct person using two identifiers.   I discussed the limitations of evaluation and management by telemedicine and the availability of in person appointments. The patient expressed understanding and agreed to proceed.     I discussed the assessment and treatment plan with the patient. The patient was provided an opportunity to ask questions and all were answered. The patient agreed with the plan and demonstrated an understanding of the instructions.   The patient was advised to call back or seek an in-person evaluation if the symptoms worsen or if the condition fails to improve as anticipated.  BH MD OP Progress Note  12/07/2019 12:19 PM Misty Peters  MRN:  [id number]  Chief Complaint:  Chief Complaint    Follow-up     HPI: Misty Peters is a 24 year old Caucasian female, student at [employer], currently employed as a Manufacturing systems engineer, lives in [place], has a history of PTSD, MDD, GAD was evaluated by telemedicine today.  Patient today reports she continues to have ups and downs in her mood however it may be a little bit better with the lithium.  She continues to have self-injurious thoughts at the back of her mind.  She reports it is constant and however she is able to distract herself.  It does not affect her functioning at work.  She copes with her thoughts by talking to her mother who has been supportive, as well as focusing on her students or holding them.  She also has scheduled an appointment with her therapist and has her first visit this Saturday.  She does not know her name yet.  She however agrees to sign a release so we can communicate.  Patient reports sleep is good as long she takes hydroxyzine.  Patient has not been able to  get her labs as recommended last visit.  She however reports she has an appointment with her primary care doctor tomorrow to get these labs done and agrees to get them done.  Patient denies any perceptual disturbances.  Patient denies any side effects to her medications.  She does report psychosocial stressors of her uncle who passed away in an accident recently.  She reports she was close to this uncle.  So far she has been coping okay and looks forward to her therapy visit.    Visit Diagnosis: R/O Borderline personality disorder   ICD-10-CM   1. Bipolar disorder, current episode mixed, moderate (HCC)  F31.62 hydrOXYzine (ATARAX/VISTARIL) 25 MG tablet  2. PTSD (post-traumatic stress disorder)  F43.10 hydrOXYzine (ATARAX/VISTARIL) 25 MG tablet  3. High risk medication use  Z79.899     Past Psychiatric History: I have reviewed past psychiatric history from my progress note on 11/17/2019.  Past trials of Zoloft, trazodone.  Past Medical History:  Past Medical History:  Diagnosis Date  . Anxiety   . Bipolar disorder with depression (HCC) 11/07/2015  . Depression   . Headache     Past Surgical History:  Procedure Laterality Date  . GUM SURGERY    . SALIVARY STONE REMOVAL    . WISDOM TOOTH EXTRACTION      Family Psychiatric History: I have reviewed family psychiatric history from my progress note on 11/17/2019.  Family History:  Family History  Problem Relation Age of  Onset  . Hyperlipidemia Father   . Hypertension Father   . Diabetes Father   . Heart disease Paternal Grandmother   . Stroke Paternal Grandmother   . Cancer Paternal Grandmother        lung and breast  . Bipolar disorder Paternal Grandmother   . ADD / ADHD Paternal Grandmother   . Cancer Maternal Grandmother        breast  . Bipolar disorder Paternal Aunt   . Drug abuse Other   . COPD Neg Hx     Social History: I have reviewed social history from my progress note on 11/17/2019. Social History    Socioeconomic History  . Marital status: Single    Spouse name: Not on file  . Number of children: 0  . Years of education: 47  . Highest education level: 10th grade  Occupational History  . Not on file  Tobacco Use  . Smoking status: Never Smoker  . Smokeless tobacco: Never Used  Vaping Use  . Vaping Use: Never used  Substance and Sexual Activity  . Alcohol use: No    Alcohol/week: 0.0 standard drinks  . Drug use: No  . Sexual activity: Not Currently  Other Topics Concern  . Not on file  Social History Narrative   Born and raised in [place] by both parents   1 older brother   Never married and no kids      Lives with parents and brother in [place]   Social Determinants of Health   Financial Resource Strain:   . Difficulty of Paying Living Expenses:   Food Insecurity:   . Worried About Programme researcher, broadcasting/film/video in the Last Year:   . Barista in the Last Year:   Transportation Needs:   . Freight forwarder (Medical):   Marland Kitchen Lack of Transportation (Non-Medical):   Physical Activity:   . Days of Exercise per Week:   . Minutes of Exercise per Session:   Stress:   . Feeling of Stress :   Social Connections:   . Frequency of Communication with Friends and Family:   . Frequency of Social Gatherings with Friends and Family:   . Attends Religious Services:   . Active Member of Clubs or Organizations:   . Attends Banker Meetings:   Marland Kitchen Marital Status:     Allergies: No Known Allergies  Metabolic Disorder Labs: No results found for: HGBA1C, MPG No results found for: PROLACTIN Lab Results  Component Value Date   CHOL 163 09/20/2019   TRIG 316 (H) 09/20/2019   HDL 41 09/20/2019   CHOLHDL 3.1 02/07/2018   LDLCALC 72 09/20/2019   LDLCALC 83 02/07/2018   Lab Results  Component Value Date   TSH 2.960 09/20/2019   TSH 2.32 02/07/2018    Therapeutic Level Labs: No results found for: LITHIUM No results found for: VALPROATE No components found for:   CBMZ  Current Medications: Current Outpatient Medications  Medication Sig Dispense Refill  . hydrOXYzine (ATARAX/VISTARIL) 25 MG tablet Take 0.5-1 tablets (12.5-25 mg total) by mouth 2 (two) times daily as needed. Severe anxiety attacks only 45 tablet 1  . lithium 300 MG tablet Take 1 tablet (300 mg total) by mouth 2 (two) times daily after a meal. 60 tablet 1  . sertraline (ZOLOFT) 100 MG tablet Take 1 tablet (100 mg total) by mouth daily. 30 tablet 1  . traZODone (DESYREL) 50 MG tablet Take 0.5 tablets (25 mg total) by mouth  at bedtime as needed for sleep. 30 tablet 1   No current facility-administered medications for this visit.     Musculoskeletal: Strength & Muscle Tone: UTA Gait & Station: normal Patient leans: N/A  Psychiatric Specialty Exam: Review of Systems  Psychiatric/Behavioral:       Mood swings and self injurious thoughts all the time - chronic  All other systems reviewed and are negative.   There were no vitals taken for this visit.There is no height or weight on file to calculate BMI.  General Appearance: Casual  Eye Contact:  Fair  Speech:  Clear and Coherent  Volume:  Normal  Mood:  Mood swings improving  Affect:  Congruent  Thought Process:  Goal Directed and Descriptions of Associations: Intact  Orientation:  Full (Time, Place, and Person)  Thought Content: Logical   Suicidal Thoughts:  has self injurious thoughts all the time - denies any active plans - is able to cope well - they are chronic  Homicidal Thoughts:  No  Memory:  Immediate;   Fair Recent;   Fair Remote;   Fair  Judgement:  Fair  Insight:  Fair  Psychomotor Activity:  Normal  Concentration:  Concentration: Fair and Attention Span: Fair  Recall:  [employer] of Knowledge: Fair  Language: Fair  Akathisia:  No  Handed:  Right  AIMS (if indicated): UTA  Assets:  Communication Skills Desire for Improvement Social Support  ADL's:  Intact  Cognition: WNL  Sleep:  Fair    Screenings: AUDIT     Admission (Discharged) from 08/13/2019 in [facility]  Alcohol Use Disorder Identification Test Final Score (AUDIT) 0    GAD-7     Office Visit from 09/20/2019 in [facility] Office Visit from 03/10/2018 in [facility]  Total GAD-7 Score 6 12    PHQ2-9     Office Visit from 09/20/2019 in [place] Visit from 03/10/2018 in [facility] Visit from 02/07/2018 in [facility] Office Visit from 11/10/2016 in [facility] Office Visit from 11/07/2015 in [facility]  PHQ-2 Total Score 6 6 6  0 6  PHQ-9 Total Score 15 23 15  -- 19       Assessment and Plan: Misty Peters is a 24 year old Caucasian female, student at [employer], employed, has a history of PTSD, MDD was evaluated by telemedicine today.  Patient is biologically predisposed given her history of trauma, family history of mental health problems.  Patient with psychosocial stressors of the current pandemic, being in school.  Patient with self-injurious behaviors and thoughts however has been coping okay and currently is compliant on medications.  She also looks forward to starting psychotherapy session soon.  Patient however has been noncompliant with labs as ordered.  Encouraged her to do so.  Plan as noted below.  Plan  Bipolar disorder mixed-unstable Lithium 300 mg p.o. twice daily with meals Hydroxyzine 12.5 to 25 mg p.o. daily as needed for severe anxiety/agitation Zoloft 100 mg p.o. daily Trazodone 25 to 50 mg p.o. nightly as needed  PTSD-improving Patient has upcoming appointment for psychotherapy session. Zoloft as prescribed  High risk medication use-pending EKG which was ordered on 11/17/2019 Patient also has been noncompliant with lithium level, BMP, CBC, TSH-encouraged her to do so.  She has upcoming appointment with primary care doctor office to get  it done tomorrow as reported.  Rule out borderline personality disorder-we will monitor closely  Discussed  with patient to sign a release to obtain medical records from her psychotherapist.  Follow-up in clinic in 3 to 4 weeks or sooner if needed.  I have spent atleast 20 minutes non face to face with patient today. More than 50 % of the time was spent for preparing to see the patient ( e.g., review of test, records ), obtaining and to review and separately obtained history , ordering medications and test ,psychoeducation and supportive psychotherapy and care coordination,as well as documenting clinical information in electronic health record. This note was generated in part or whole with voice recognition software. Voice recognition is usually quite accurate but there are transcription errors that can and very often do occur. I apologize for any typographical errors that were not detected and corrected.      Jomarie Longs, MD 12/07/2019, 12:19 PM

## 2019-12-08 ENCOUNTER — Ambulatory Visit (INDEPENDENT_AMBULATORY_CARE_PROVIDER_SITE_OTHER): Payer: BC Managed Care – PPO | Admitting: Family Medicine

## 2019-12-08 ENCOUNTER — Encounter: Payer: Self-pay | Admitting: Family Medicine

## 2019-12-08 VITALS — BP 107/71 | HR 71 | Temp 98.3°F | Wt 154.0 lb

## 2019-12-08 DIAGNOSIS — F419 Anxiety disorder, unspecified: Secondary | ICD-10-CM | POA: Diagnosis not present

## 2019-12-08 DIAGNOSIS — Z30011 Encounter for initial prescription of contraceptive pills: Secondary | ICD-10-CM

## 2019-12-08 DIAGNOSIS — F3130 Bipolar disorder, current episode depressed, mild or moderate severity, unspecified: Secondary | ICD-10-CM | POA: Diagnosis not present

## 2019-12-08 MED ORDER — LO LOESTRIN FE 1 MG-10 MCG / 10 MCG PO TABS: 1.0000 | ORAL_TABLET | Freq: Every day | ORAL | 11 refills | Status: DC

## 2019-12-08 NOTE — Progress Notes (Signed)
BP 107/71   Pulse 71   Temp 98.3 F (36.8 C) (Oral)   Wt 154 lb (69.9 kg)   SpO2 97%   BMI 29.10 kg/m    Subjective:    Patient ID: Misty Peters, female    DOB: Jul 26, 1995, 24 y.o.   MRN: [id number]  HPI: Misty Peters is a 24 y.o. female  Chief Complaint  Patient presents with  . Follow-up    pt states that her psychiatrist advised her to have an EKG done due to starting a new medication Lithium  . Constipation   Presenting today at the request of her Psychiatrist for ECG due to new start lithium for bipolar depression. Denies CP, SOB, dizziness, hx of cardiac issues. Overall in usual state of health.   Wanting to go back on birth control pills. Has been on several types in the past and done well. Currently on menstrual cycle, declines preg test as she states no chance of pregnancy.   Relevant past medical, surgical, family and social history reviewed and updated as indicated. Interim medical history since our last visit reviewed. Allergies and medications reviewed and updated.  Review of Systems  Per HPI unless specifically indicated above     Objective:    BP 107/71   Pulse 71   Temp 98.3 F (36.8 C) (Oral)   Wt 154 lb (69.9 kg)   SpO2 97%   BMI 29.10 kg/m   Wt Readings from Last 3 Encounters:  12/08/19 154 lb (69.9 kg)  09/20/19 153 lb (69.4 kg)  08/13/19 150 lb (68 kg)    Physical Exam Vitals and nursing note reviewed.  Constitutional:      Appearance: Normal appearance. She is not ill-appearing.  HENT:     Head: Atraumatic.  Eyes:     Extraocular Movements: Extraocular movements intact.     Conjunctiva/sclera: Conjunctivae normal.  Cardiovascular:     Rate and Rhythm: Normal rate and regular rhythm.     Heart sounds: Normal heart sounds.  Pulmonary:     Effort: Pulmonary effort is normal.     Breath sounds: Normal breath sounds.  Musculoskeletal:        General: Normal range of motion.     Cervical back: Normal range of motion and  neck supple.  Skin:    General: Skin is warm and dry.  Neurological:     Mental Status: She is alert and oriented to person, place, and time.  Psychiatric:        Mood and Affect: Mood normal.        Thought Content: Thought content normal.        Judgment: Judgment normal.     Results for orders placed or performed in visit on 09/20/19  Microscopic Examination   URINE  Result Value Ref Range   WBC, UA None seen 0 - 5 /hpf   RBC 3-10 (A) 0 - 2 /hpf   Epithelial Cells (non renal) 0-10 0 - 10 /hpf   Mucus, UA Present Not Estab.   Bacteria, UA Few (A) None seen/Few  CBC with Differential/Platelet  Result Value Ref Range   WBC 8.5 3.4 - 10.8 x10E3/uL   RBC 4.10 3.77 - 5.28 x10E6/uL   Hemoglobin 11.9 11.1 - 15.9 g/dL   Hematocrit 96.2 95.2 - 46.6 %   MCV 86 79 - 97 fL   MCH 29.0 26.6 - 33.0 pg   MCHC 33.7 31 - 35 g/dL   RDW 84.1 32.4 -  15.4 %   Platelets 428 150 - 450 x10E3/uL   Neutrophils 54 Not Estab. %   Lymphs 35 Not Estab. %   Monocytes 7 Not Estab. %   Eos 3 Not Estab. %   Basos 1 Not Estab. %   Neutrophils Absolute 4.7 1 - 7 x10E3/uL   Lymphocytes Absolute 2.9 0 - 3 x10E3/uL   Monocytes Absolute 0.6 0 - 0 x10E3/uL   EOS (ABSOLUTE) 0.2 0.0 - 0.4 x10E3/uL   Basophils Absolute 0.0 0 - 0 x10E3/uL   Immature Granulocytes 0 Not Estab. %   Immature Grans (Abs) 0.0 0.0 - 0.1 x10E3/uL  Comprehensive metabolic panel  Result Value Ref Range   Glucose 83 65 - 99 mg/dL   BUN 10 6 - 20 mg/dL   Creatinine, Ser 3.53 0.57 - 1.00 mg/dL   GFR calc non Af Amer 124 >59 mL/min/1.73   GFR calc Af Amer 143 >59 mL/min/1.73   BUN/Creatinine Ratio 15 9 - 23   Sodium 140 134 - 144 mmol/L   Potassium 4.3 3.5 - 5.2 mmol/L   Chloride 105 96 - 106 mmol/L   CO2 22 20 - 29 mmol/L   Calcium 9.1 8.7 - 10.2 mg/dL   Total Protein 6.5 6.0 - 8.5 g/dL   Albumin 4.1 3.9 - 5.0 g/dL   Globulin, Total 2.4 1.5 - 4.5 g/dL   Albumin/Globulin Ratio 1.7 1.2 - 2.2   Bilirubin Total <0.2 0.0 - 1.2 mg/dL     Alkaline Phosphatase 78 39 - 117 IU/L   AST 28 0 - 40 IU/L   ALT 27 0 - 32 IU/L  Lipid Panel w/o Chol/HDL Ratio  Result Value Ref Range   Cholesterol, Total 163 100 - 199 mg/dL   Triglycerides 299 (H) 0 - 149 mg/dL   HDL 41 >24 mg/dL   VLDL Cholesterol Cal 50 (H) 5 - 40 mg/dL   LDL Chol Calc (NIH) 72 0 - 99 mg/dL  TSH  Result Value Ref Range   TSH 2.960 0.450 - 4.500 uIU/mL  UA/M w/rflx Culture, Routine   Specimen: Urine   URINE  Result Value Ref Range   Specific Gravity, UA 1.020 1.005 - 1.030   pH, UA 7.0 5.0 - 7.5   Color, UA Yellow Yellow   Appearance Ur Clear Clear   Leukocytes,UA Negative Negative   Protein,UA Trace (A) Negative/Trace   Glucose, UA Negative Negative   Ketones, UA Negative Negative   RBC, UA 3+ (A) Negative   Bilirubin, UA Negative Negative   Urobilinogen, Ur 1.0 0.2 - 1.0 mg/dL   Nitrite, UA Negative Negative   Microscopic Examination See below:   QuantiFERON-TB Gold Plus  Result Value Ref Range   QuantiFERON Incubation Incubation performed.    QuantiFERON Criteria Comment    QuantiFERON TB1 Ag Value 0.05 IU/mL   QuantiFERON TB2 Ag Value 0.05 IU/mL   QuantiFERON Nil Value 0.04 IU/mL   QuantiFERON Mitogen Value >10.00 IU/mL   QuantiFERON-TB Gold Plus Negative Negative      Assessment & Plan:   Problem List Items Addressed This Visit      Other   Anxiety - Primary   Relevant Orders   EKG 12-Lead (Completed)   Encounter for BCP (birth control pills) initial prescription    Will start lo loestrin and monitor for tolerance. Declines urine preg today, currently on menstrual period. Discussed proper use and to use extra form of protection for at least 1 week after starting the pill  Bipolar I disorder, most recent episode depressed ([place])    Newly started on Lithium by Psychiatrist, who has requested ECG. Has order for lithium level first thing in the AM in the system. Locations of [facility] draw stations given to pt so she can go get this  done. ECG today stable, NSR without significant abnormalities          Follow up plan: Return in about 1 year (around 12/07/2020) for CPE.

## 2019-12-12 ENCOUNTER — Encounter: Payer: Self-pay | Admitting: Family Medicine

## 2019-12-12 NOTE — Assessment & Plan Note (Signed)
Will start lo loestrin and monitor for tolerance. Declines urine preg today, currently on menstrual period. Discussed proper use and to use extra form of protection for at least 1 week after starting the pill

## 2019-12-12 NOTE — Assessment & Plan Note (Signed)
Newly started on Lithium by Psychiatrist, who has requested ECG. Has order for lithium level first thing in the AM in the system. Locations of [facility] draw stations given to pt so she can go get this done. ECG today stable, NSR without significant abnormalities

## 2019-12-14 ENCOUNTER — Encounter: Payer: Self-pay | Admitting: Family Medicine

## 2019-12-14 ENCOUNTER — Telehealth (INDEPENDENT_AMBULATORY_CARE_PROVIDER_SITE_OTHER): Payer: BC Managed Care – PPO | Admitting: Family Medicine

## 2019-12-14 VITALS — Temp 99.4°F | Wt 159.0 lb

## 2019-12-14 DIAGNOSIS — J029 Acute pharyngitis, unspecified: Secondary | ICD-10-CM

## 2019-12-14 DIAGNOSIS — R112 Nausea with vomiting, unspecified: Secondary | ICD-10-CM | POA: Diagnosis not present

## 2019-12-14 DIAGNOSIS — R42 Dizziness and giddiness: Secondary | ICD-10-CM | POA: Diagnosis not present

## 2019-12-14 MED ORDER — ONDANSETRON 4 MG PO TBDP: 4.0000 mg | ORAL_TABLET | Freq: Three times a day (TID) | ORAL | 0 refills | Status: DC | PRN

## 2019-12-17 NOTE — Progress Notes (Signed)
Temp 99.4 F (37.4 C) (Oral)   Wt 159 lb (72.1 kg)   BMI 30.04 kg/m    Subjective:    Patient ID: Misty Peters, female    DOB: 05-Oct-1995, 24 y.o.   MRN: [id number]  HPI: Misty Peters is a 24 y.o. female  Chief Complaint  Patient presents with  . Headache    symptoms started last Saturday  . Sore Throat  . Diarrhea  . Dizziness    . This visit was completed via MyChart due to the restrictions of the COVID-19 pandemic. All issues as above were discussed and addressed. Physical exam was done as above through visual confirmation on MyChart. If it was felt that the patient should be evaluated in the office, they were directed there. The patient verbally consented to this visit. . Location of the patient: home . Location of the provider: work . Those involved with this call:  . Provider: Roosvelt Maser, PA-C . CMA: Elton Sin, CMA . Front Desk/Registration: Adela Ports  . Time spent on call: 15 minutes with patient face to face via video conference. More than 50% of this time was spent in counseling and coordination of care. 5 minutes total spent in review of patient's record and preparation of their chart. I verified patient identity using two factors (patient name and date of birth). Patient consents verbally to being seen via telemedicine visit today.   Started Saturday with headache, sore throat, diarrhea, dizziness, N/V, hoarseness. Overall feeling better today but still having some nausea and fatigue, congestion. Using OTC pain relievers and sinus medications with mild relief. Denies known sick contacts, recent travel.   Relevant past medical, surgical, family and social history reviewed and updated as indicated. Interim medical history since our last visit reviewed. Allergies and medications reviewed and updated.  Review of Systems  Per HPI unless specifically indicated above     Objective:    Temp 99.4 F (37.4 C) (Oral)   Wt 159 lb (72.1 kg)   BMI  30.04 kg/m   Wt Readings from Last 3 Encounters:  12/14/19 159 lb (72.1 kg)  12/08/19 154 lb (69.9 kg)  09/20/19 153 lb (69.4 kg)    Physical Exam Vitals and nursing note reviewed.  Constitutional:      General: She is not in acute distress.    Appearance: Normal appearance.  HENT:     Head: Atraumatic.     Right Ear: External ear normal.     Left Ear: External ear normal.     Nose: Nose normal. No congestion.     Mouth/Throat:     Mouth: Mucous membranes are moist.     Pharynx: Oropharynx is clear. No posterior oropharyngeal erythema.  Eyes:     Extraocular Movements: Extraocular movements intact.     Conjunctiva/sclera: Conjunctivae normal.  Cardiovascular:     Comments: Unable to assess via virtual visit Pulmonary:     Effort: Pulmonary effort is normal. No respiratory distress.  Musculoskeletal:        General: Normal range of motion.     Cervical back: Normal range of motion.  Skin:    General: Skin is dry.     Findings: No erythema.  Neurological:     Mental Status: She is alert and oriented to person, place, and time.  Psychiatric:        Mood and Affect: Mood normal.        Thought Content: Thought content normal.  Judgment: Judgment normal.     Results for orders placed or performed in visit on 09/20/19  Microscopic Examination   URINE  Result Value Ref Range   WBC, UA None seen 0 - 5 /hpf   RBC 3-10 (A) 0 - 2 /hpf   Epithelial Cells (non renal) 0-10 0 - 10 /hpf   Mucus, UA Present Not Estab.   Bacteria, UA Few (A) None seen/Few  CBC with Differential/Platelet  Result Value Ref Range   WBC 8.5 3.4 - 10.8 x10E3/uL   RBC 4.10 3.77 - 5.28 x10E6/uL   Hemoglobin 11.9 11.1 - 15.9 g/dL   Hematocrit 35.3 34.0 - 46.6 %   MCV 86 79 - 97 fL   MCH 29.0 26.6 - 33.0 pg   MCHC 33.7 31 - 35 g/dL   RDW 12.8 11.7 - 15.4 %   Platelets 428 150 - 450 x10E3/uL   Neutrophils 54 Not Estab. %   Lymphs 35 Not Estab. %   Monocytes 7 Not Estab. %   Eos 3 Not Estab.  %   Basos 1 Not Estab. %   Neutrophils Absolute 4.7 1 - 7 x10E3/uL   Lymphocytes Absolute 2.9 0 - 3 x10E3/uL   Monocytes Absolute 0.6 0 - 0 x10E3/uL   EOS (ABSOLUTE) 0.2 0.0 - 0.4 x10E3/uL   Basophils Absolute 0.0 0 - 0 x10E3/uL   Immature Granulocytes 0 Not Estab. %   Immature Grans (Abs) 0.0 0.0 - 0.1 x10E3/uL  Comprehensive metabolic panel  Result Value Ref Range   Glucose 83 65 - 99 mg/dL   BUN 10 6 - 20 mg/dL   Creatinine, Ser 0.67 0.57 - 1.00 mg/dL   GFR calc non Af Amer 124 >59 mL/min/1.73   GFR calc Af Amer 143 >59 mL/min/1.73   BUN/Creatinine Ratio 15 9 - 23   Sodium 140 134 - 144 mmol/L   Potassium 4.3 3.5 - 5.2 mmol/L   Chloride 105 96 - 106 mmol/L   CO2 22 20 - 29 mmol/L   Calcium 9.1 8.7 - 10.2 mg/dL   Total Protein 6.5 6.0 - 8.5 g/dL   Albumin 4.1 3.9 - 5.0 g/dL   Globulin, Total 2.4 1.5 - 4.5 g/dL   Albumin/Globulin Ratio 1.7 1.2 - 2.2   Bilirubin Total <0.2 0.0 - 1.2 mg/dL   Alkaline Phosphatase 78 39 - 117 IU/L   AST 28 0 - 40 IU/L   ALT 27 0 - 32 IU/L  Lipid Panel w/o Chol/HDL Ratio  Result Value Ref Range   Cholesterol, Total 163 100 - 199 mg/dL   Triglycerides 316 (H) 0 - 149 mg/dL   HDL 41 >39 mg/dL   VLDL Cholesterol Cal 50 (H) 5 - 40 mg/dL   LDL Chol Calc (NIH) 72 0 - 99 mg/dL  TSH  Result Value Ref Range   TSH 2.960 0.450 - 4.500 uIU/mL  UA/M w/rflx Culture, Routine   Specimen: Urine   URINE  Result Value Ref Range   Specific Gravity, UA 1.020 1.005 - 1.030   pH, UA 7.0 5.0 - 7.5   Color, UA Yellow Yellow   Appearance Ur Clear Clear   Leukocytes,UA Negative Negative   Protein,UA Trace (A) Negative/Trace   Glucose, UA Negative Negative   Ketones, UA Negative Negative   RBC, UA 3+ (A) Negative   Bilirubin, UA Negative Negative   Urobilinogen, Ur 1.0 0.2 - 1.0 mg/dL   Nitrite, UA Negative Negative   Microscopic Examination See below:   QuantiFERON-TB  Gold Plus  Result Value Ref Range   QuantiFERON Incubation Incubation performed.     QuantiFERON Criteria Comment    QuantiFERON TB1 Ag Value 0.05 IU/mL   QuantiFERON TB2 Ag Value 0.05 IU/mL   QuantiFERON Nil Value 0.04 IU/mL   QuantiFERON Mitogen Value >10.00 IU/mL   QuantiFERON-TB Gold Plus Negative Negative      Assessment & Plan:   Problem List Items Addressed This Visit    None    Visit Diagnoses    Non-intractable vomiting with nausea, unspecified vomiting type    -  Primary   Sore throat       Dizziness          Sxs resolving. Continue to stay home from work until sxs resolve. WIll provide work note for entire week due to illness. Recommended COVID testing, isolation, supportive care. Strict return precautions given  Follow up plan: Return if symptoms worsen or fail to improve.

## 2019-12-28 ENCOUNTER — Other Ambulatory Visit: Payer: Self-pay

## 2019-12-28 ENCOUNTER — Telehealth (INDEPENDENT_AMBULATORY_CARE_PROVIDER_SITE_OTHER): Payer: BC Managed Care – PPO | Admitting: Psychiatry

## 2019-12-28 ENCOUNTER — Encounter: Payer: Self-pay | Admitting: Psychiatry

## 2019-12-28 DIAGNOSIS — F3162 Bipolar disorder, current episode mixed, moderate: Secondary | ICD-10-CM | POA: Diagnosis not present

## 2019-12-28 DIAGNOSIS — F4310 Post-traumatic stress disorder, unspecified: Secondary | ICD-10-CM

## 2019-12-28 MED ORDER — SERTRALINE HCL 100 MG PO TABS: 100.0000 mg | ORAL_TABLET | Freq: Every day | ORAL | 1 refills | Status: DC

## 2019-12-28 MED ORDER — TRAZODONE HCL 50 MG PO TABS: 50.0000 mg | ORAL_TABLET | Freq: Every evening | ORAL | 1 refills | Status: DC | PRN

## 2019-12-28 NOTE — Progress Notes (Signed)
Provider Location : [facility] Patient Location : Home  Virtual Visit via Video Note  I connected with Misty Peters on 12/28/19 at  2:15 PM EDT by a video enabled telemedicine application and verified that I am speaking with the correct person using two identifiers.   I discussed the limitations of evaluation and management by telemedicine and the availability of in person appointments. The patient expressed understanding and agreed to proceed.   I discussed the assessment and treatment plan with the patient. The patient was provided an opportunity to ask questions and all were answered. The patient agreed with the plan and demonstrated an understanding of the instructions.   The patient was advised to call back or seek an in-person evaluation if the symptoms worsen or if the condition fails to improve as anticipated.  BH MD OP Progress Note  12/28/2019 2:25 PM Misty Peters  MRN:  [id number]  Chief Complaint:  Chief Complaint    Follow-up     HPI: Misty Peters is a 24 year old Caucasian female, student at [employer], currently employed as a Manufacturing systems engineer, lives in [place], has a history of PTSD, bipolar disorder, GAD was evaluated by telemedicine today.  Patient today reports she is currently taking a break.  She [place].  She reports she enjoyed her vacation.  She however accidentally hit her head while she was playing with her family.  She reports she currently does not have any significant problems from the same.  She agrees to reach out to her primary care provider.  Patient reports she had to stop taking the lithium since she had a meltdown.  She reports increased crying spell and feeling overwhelmed and hence decided to stop it.  She reports she currently feels much better now that she is not taking the lithium.  She reports sleep is restless.  She continues to have nightmares every 3 days or so.  Her nightmares are  about people getting killed.  Patient reports she has difficulty falling asleep and maintaining sleep.  The trazodone low dosage helped to some extent.  She denies side effects.  Patient with history of chronic suicidal thoughts, continues to have self-injurious thoughts all the time.  Patient however reports she has found several ways to cope with her thoughts.  She reports she is currently working on mindfulness, Teacher, adult education, as well as focusing on 5 senses, talking to her mother, spending time with her students while at work and so on.  She reports she did not follow-up with therapist as discussed last visit however would like to restart psychotherapy sessions with Ms. Juanito Doom now that she is back in office.  She reports she has already reached out to schedule an appointment and has to reach out to confirm it.  Patient denies any other concerns today.  Visit Diagnosis: R/O Borderline personality disorder   ICD-10-CM   1. Bipolar disorder, current episode mixed, moderate (HCC)  F31.62 traZODone (DESYREL) 50 MG tablet  2. PTSD (post-traumatic stress disorder)  F43.10 traZODone (DESYREL) 50 MG tablet    sertraline (ZOLOFT) 100 MG tablet    Past Psychiatric History: I have reviewed past psychiatric history from my progress note on 11/17/2019.  Past trials of Zoloft, trazodone.  Past Medical History:  Past Medical History:  Diagnosis Date  . Anxiety   . Bipolar disorder with depression (HCC) 11/07/2015  . Depression   . Headache     Past Surgical  History:  Procedure Laterality Date  . GUM SURGERY    . SALIVARY STONE REMOVAL    . WISDOM TOOTH EXTRACTION      Family Psychiatric History: I have reviewed family psychiatric history from my progress note on 11/17/2019  Family History:  Family History  Problem Relation Age of Onset  . Hyperlipidemia Father   . Hypertension Father   . Diabetes Father   . Heart disease Paternal Grandmother   . Stroke Paternal Grandmother   .  Cancer Paternal Grandmother        lung and breast  . Bipolar disorder Paternal Grandmother   . ADD / ADHD Paternal Grandmother   . Cancer Maternal Grandmother        breast  . Bipolar disorder Paternal Aunt   . Drug abuse Other   . COPD Neg Hx     Social History: I have reviewed social history from my progress note on 11/17/2019 Social History   Socioeconomic History  . Marital status: Single    Spouse name: Not on file  . Number of children: 0  . Years of education: 3913  . Highest education level: 10th grade  Occupational History  . Not on file  Tobacco Use  . Smoking status: Never Smoker  . Smokeless tobacco: Never Used  Vaping Use  . Vaping Use: Never used  Substance and Sexual Activity  . Alcohol use: No    Alcohol/week: 0.0 standard drinks  . Drug use: No  . Sexual activity: Not Currently  Other Topics Concern  . Not on file  Social History Narrative   Born and raised in [place] by both parents   1 older brother   Never married and no kids      Lives with parents and brother in [place]   Social Determinants of Health   Financial Resource Strain:   . Difficulty of Paying Living Expenses:   Food Insecurity:   . Worried About Programme researcher, broadcasting/film/videounning Out of Food in the Last Year:   . Baristaan Out of Food in the Last Year:   Transportation Needs:   . Freight forwarderLack of Transportation (Medical):   Marland Kitchen. Lack of Transportation (Non-Medical):   Physical Activity:   . Days of Exercise per Week:   . Minutes of Exercise per Session:   Stress:   . Feeling of Stress :   Social Connections:   . Frequency of Communication with Friends and Family:   . Frequency of Social Gatherings with Friends and Family:   . Attends Religious Services:   . Active Member of Clubs or Organizations:   . Attends BankerClub or Organization Meetings:   Marland Kitchen. Marital Status:     Allergies: No Known Allergies  Metabolic Disorder Labs: No results found for: HGBA1C, MPG No results found for: PROLACTIN Lab Results  Component Value  Date   CHOL 163 09/20/2019   TRIG 316 (H) 09/20/2019   HDL 41 09/20/2019   CHOLHDL 3.1 02/07/2018   LDLCALC 72 09/20/2019   LDLCALC 83 02/07/2018   Lab Results  Component Value Date   TSH 2.960 09/20/2019   TSH 2.32 02/07/2018    Therapeutic Level Labs: No results found for: LITHIUM No results found for: VALPROATE No components found for:  CBMZ  Current Medications: Current Outpatient Medications  Medication Sig Dispense Refill  . hydrOXYzine (ATARAX/VISTARIL) 25 MG tablet Take 0.5-1 tablets (12.5-25 mg total) by mouth 2 (two) times daily as needed. Severe anxiety attacks only 45 tablet 1  . Norethindrone-Ethinyl Estradiol-Fe  Biphas (LO LOESTRIN FE) 1 MG-10 MCG / 10 MCG tablet Take 1 tablet by mouth daily. 28 tablet 11  . ondansetron (ZOFRAN ODT) 4 MG disintegrating tablet Take 1 tablet (4 mg total) by mouth every 8 (eight) hours as needed. 30 tablet 0  . sertraline (ZOLOFT) 100 MG tablet Take 1 tablet (100 mg total) by mouth daily. 30 tablet 1  . traZODone (DESYREL) 50 MG tablet Take 1-1.5 tablets (50-75 mg total) by mouth at bedtime as needed for sleep. 45 tablet 1   No current facility-administered medications for this visit.     Musculoskeletal: Strength & Muscle Tone: UTA Gait & Station: normal Patient leans: N/A  Psychiatric Specialty Exam: Review of Systems  Psychiatric/Behavioral: Positive for sleep disturbance. The patient is nervous/anxious.   All other systems reviewed and are negative.   There were no vitals taken for this visit.There is no height or weight on file to calculate BMI.  General Appearance: Casual  Eye Contact:  Fair  Speech:  Clear and Coherent  Volume:  Normal  Mood:  Anxious,mood swings - improving  Affect:  Congruent  Thought Process:  Goal Directed and Descriptions of Associations: Intact  Orientation:  Full (Time, Place, and Person)  Thought Content: Logical   Suicidal Thoughts:  No- But does have a history of chronic suicidality,  currently has been coping better   Homicidal Thoughts:  No  Memory:  Immediate;   Fair Recent;   Fair Remote;   Fair  Judgement:  Fair  Insight:  Fair  Psychomotor Activity:  Normal  Concentration:  Concentration: Fair and Attention Span: Fair  Recall:  [employer] of Knowledge: Fair  Language: Fair  Akathisia:  No  Handed:  Right  AIMS (if indicated): UTA  Assets:  Communication Skills Desire for Improvement Housing Social Support  ADL's:  Intact  Cognition: WNL  Sleep:  Poor   Screenings: AUDIT     Admission (Discharged) from 08/13/2019 in [facility]  Alcohol Use Disorder Identification Test Final Score (AUDIT) 0    GAD-7     Office Visit from 09/20/2019 in [facility] Office Visit from 03/10/2018 in [facility]  Total GAD-7 Score 6 12    PHQ2-9     Office Visit from 09/20/2019 in [facility] Office Visit from 03/10/2018 in [facility] Office Visit from 02/07/2018 in [facility] Office Visit from 11/10/2016 in [facility] Office Visit from 11/07/2015 in [facility]  PHQ-2 Total Score 6 6 6  0 6  PHQ-9 Total Score 15 23 15  -- 19       Assessment and Plan: Taygen Newsome is a 24 year old Caucasian female, student at [employer], employed, has a history of PTSD, MDD was evaluated by telemedicine today.  Patient is biologically predisposed given her history of trauma, family history of mental health problems.  Patient with psychosocial stressors of the current pandemic, being in school.  Patient with chronic self-injurious thoughts however currently is making progress with regards to that.  Patient with adverse side effects to lithium continues to struggle with sleep issues and will continue to benefit from medication management.  Plan as noted below.  Plan Bipolar disorder mixed-some progress Discontinue lithium for side effects. Hydroxyzine  12.5 to 25 mg p.o. daily as needed for severe anxiety/agitation Zoloft 100 mg p.o. daily Increase trazodone to 50 to 75 mg p.o. nightly as needed  PTSD-improving Patient advised to restart psychotherapy  sessions.  Patient reports she wants to restart therapy sessions with Ms. Juanito Doom. Zoloft as prescribed Increase trazodone to 50 to 75 mg p.o. nightly as needed Patient declines any further medication changes today reports that she is already on several medication and wants to try psychotherapy sessions first. Patient with nightmares-advised to discuss it with therapist.  Also discussed with patient about medications like prazosin that she is not interested right now.  Rule out borderline personality disorder-we will monitor closely  I have reviewed TSH in E HR dated 09/20/2019-within normal limits.  Follow-up in clinic in 6-8 weeks or sooner if needed.  In the meantime advised patient to restart psychotherapy session and work with her therapist very frequently.  I have spent atleast 20 minutes non face to face with patient today. More than 50 % of the time was spent for preparing to see the patient ( e.g., review of test, records ),  ordering medications and test ,psychoeducation and supportive psychotherapy and care coordination,as well as documenting clinical information in electronic health record,interpreting results of test and communication of results.  This note was generated in part or whole with voice recognition software. Voice recognition is usually quite accurate but there are transcription errors that can and very often do occur. I apologize for any typographical errors that were not detected and corrected.         Jomarie Longs, MD 12/28/2019, 2:25 PM

## 2020-01-09 ENCOUNTER — Encounter: Payer: Self-pay | Admitting: Licensed Clinical Social Worker

## 2020-01-09 ENCOUNTER — Ambulatory Visit (INDEPENDENT_AMBULATORY_CARE_PROVIDER_SITE_OTHER): Payer: BC Managed Care – PPO | Admitting: Licensed Clinical Social Worker

## 2020-01-09 ENCOUNTER — Other Ambulatory Visit: Payer: Self-pay

## 2020-01-09 DIAGNOSIS — F3162 Bipolar disorder, current episode mixed, moderate: Secondary | ICD-10-CM

## 2020-01-09 NOTE — Progress Notes (Signed)
Patient Location: Home  Provider Location: Home Office   Virtual Visit via Video Note  I connected with Misty Peters on 01/09/20 at  2:00 PM EDT by a video enabled telemedicine application and verified that I am speaking with the correct person using two identifiers.   I discussed the limitations of evaluation and management by telemedicine and the availability of in person appointments. The patient expressed understanding and agreed to proceed.  THERAPY PROGRESS NOTE  Session Time: 30 Minutes  Participation Level: Active  Behavioral Response: Well GroomedAlertAnxious  Type of Therapy: Individual Therapy  Treatment Goals addressed: Anxiety and Coping  Interventions: CBT  Summary: Misty Peters is a 24 y.o. female who presents with depression sxs and increasing anxiety. Pt reported that she did great at first when she started current medication, but is now experiencing more anxiety and paranoid thinking as well as tics. Pt reported improvement in sxs of depression s/ "it is not as bad as it was" but still has SI with no plans or intent. 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 Pt identified skills she is using and how she can respond if sxs worsen while returning to school.   Suicidal/Homicidal: Yeswithout intent/plan  Therapist Response: Therapist met with patient for follow up session after returning from leave. Therapist and patient reviewed and updated treatment plan to reflect patient's progression towards goals and revised as needed. Pt in agreement with plan. Therapist and patient discussed updates and stressors since last session as well as planning for her return to school. Therapist encouraged patient to continue using coping/distress tolerance skills. Pt was receptive.  Plan: Return again in 1 week.  Diagnosis: Axis I: Bipolar, mixed    Axis II: N/A    [name],  LCSW, LCAS 01/09/2020   Follow Up Instructions:    I discussed the assessment and treatment plan with the patient. The patient was provided an opportunity to ask questions and all were answered. The patient agreed with the plan and demonstrated an understanding of the instructions.   The patient was advised to call back or seek an in-person evaluation if the symptoms worsen or if the condition fails to improve as anticipated.  I provided 30 minutes of non-face-to-face time during this encounter.   Joas Motton Wynelle Link, LCSW, LCAS

## 2020-01-16 ENCOUNTER — Other Ambulatory Visit: Payer: Self-pay

## 2020-01-16 ENCOUNTER — Ambulatory Visit (INDEPENDENT_AMBULATORY_CARE_PROVIDER_SITE_OTHER): Payer: BC Managed Care – PPO | Admitting: Licensed Clinical Social Worker

## 2020-01-16 ENCOUNTER — Encounter: Payer: Self-pay | Admitting: Licensed Clinical Social Worker

## 2020-01-16 DIAGNOSIS — F3162 Bipolar disorder, current episode mixed, moderate: Secondary | ICD-10-CM | POA: Diagnosis not present

## 2020-01-16 NOTE — Progress Notes (Signed)
Patient Location: Home  Provider Location: Home Office   Virtual Visit via Video Note  I connected with Misty Peters on 01/16/20 at  2:00 PM EDT by a video enabled telemedicine application and verified that I am speaking with the correct person using two identifiers.   I discussed the limitations of evaluation and management by telemedicine and the availability of in person appointments. The patient expressed understanding and agreed to proceed.  THERAPY PROGRESS NOTE  Session Time: 45 Minutes  Participation Level: Active  Behavioral Response: Well GroomedAlertEuthymic  Type of Therapy: Individual Therapy  Treatment Goals addressed: Coping  Interventions: CBT  Summary: [name] is a 24 y.o. female who presents with depression sxs and anxiety. Pt reported work has been hectic, but she still enjoys it. Pt identified ways she is managing her sxs and planning for the future. Pt discussed her current support systems outside of family and creating friendships at work.   Suicidal/Homicidal: No  Therapist Response: Therapist met with patient for follow up session. 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 Pt provided verbal and written consent. Therapist and patient discussed patient's social supports and provided psychoeducation around the life balance wheel to determine patient satisfaction level with different aspects of her life. Pt was receptive.  Plan: Return again in 1 week.  Diagnosis: Axis I: Bipolar, mixed    Axis II: N/A   P O'Reilly, LCSW, LCAS 01/16/2020  

## 2020-01-19 ENCOUNTER — Telehealth: Payer: Self-pay

## 2020-01-19 NOTE — Telephone Encounter (Signed)
paperwork was printed from the attached email and faxed and confirmed to elon

## 2020-01-19 NOTE — Telephone Encounter (Signed)
received a email from lauren o'reily LCSW that the attached paperwork needed to be faxed to elon

## 2020-01-23 ENCOUNTER — Ambulatory Visit (INDEPENDENT_AMBULATORY_CARE_PROVIDER_SITE_OTHER): Payer: BC Managed Care – PPO | Admitting: Licensed Clinical Social Worker

## 2020-01-23 ENCOUNTER — Other Ambulatory Visit: Payer: Self-pay

## 2020-01-23 ENCOUNTER — Encounter: Payer: Self-pay | Admitting: Licensed Clinical Social Worker

## 2020-01-23 DIAGNOSIS — F3162 Bipolar disorder, current episode mixed, moderate: Secondary | ICD-10-CM

## 2020-01-23 NOTE — Progress Notes (Signed)
Patient Location: Home  Provider Location: Home Office   Virtual Visit via Video Note  I connected with [name] on 01/23/20 at  2:00 PM EDT by a video enabled telemedicine application and verified that I am speaking with the correct person using two identifiers.   I discussed the limitations of evaluation and management by telemedicine and the availability of in person appointments. The patient expressed understanding and agreed to proceed.  THERAPY PROGRESS NOTE  Session Time: 30 Minutes  Participation Level: Active  Behavioral Response: Well GroomedAlertAnxious  Type of Therapy: Individual Therapy  Treatment Goals addressed: Anxiety and Coping  Interventions: CBT  Summary: Misty Peters is a 24 y.o. female who presents with some depression and anxiety sxs. 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 Pt is a bit nervous about returning to school due to workload and mainly due to having to retake an exam that she didn't pass by one point. Pt was receptive to discussion of cognitive distortions and identified her own negative thinking habits.  Suicidal/Homicidal: No  Therapist Response: Therapist met with patient for follow up session. Therapist and patient reviewed patient's plans to return to school in fall. Therapist provided psycho-education around cognitive distortions and reviewed homework from last week. Therapist encouraged patient to monitor thoughts and discuss observations next session.  Plan: Return again in 1 week.  Diagnosis: Axis I: Bipolar, mixed    Axis II: N/A  [name], LCSW, LCAS 01/23/2020

## 2020-01-30 ENCOUNTER — Other Ambulatory Visit: Payer: Self-pay

## 2020-01-30 ENCOUNTER — Ambulatory Visit (INDEPENDENT_AMBULATORY_CARE_PROVIDER_SITE_OTHER): Payer: BC Managed Care – PPO | Admitting: Licensed Clinical Social Worker

## 2020-01-30 ENCOUNTER — Encounter: Payer: Self-pay | Admitting: Licensed Clinical Social Worker

## 2020-01-30 DIAGNOSIS — F3162 Bipolar disorder, current episode mixed, moderate: Secondary | ICD-10-CM | POA: Diagnosis not present

## 2020-01-30 NOTE — Progress Notes (Signed)
Patient Location: Home  Provider Location: Home Office   Virtual Visit via Video Note  I connected with Misty Peters on 01/30/20 at  2:00 PM EDT by a video enabled telemedicine application and verified that I am speaking with the correct person using two identifiers.   I discussed the limitations of evaluation and management by telemedicine and the availability of in person appointments. The patient expressed understanding and agreed to proceed.  THERAPY PROGRESS NOTE  Session Time: 10 Minutes  Participation Level: Active  Behavioral Response: Well GroomedAlertAnxious  Type of Therapy: Individual Therapy  Treatment Goals addressed: Anxiety and Coping  Interventions: CBT  Summary: Misty Peters is a 24 y.o. female who presents with depression and anxiety sxs. Pt reported she was having a rough day and has been feeling ill for last couple of days. Pt identified current work related stressors and ways to cope. Pt reported she needed rest and was tearful earlier due to child calling her "trash" and wanted to take a break and step away from the situation. Session ended early to allow patient time to rest during work break.  Suicidal/Homicidal: No  Therapist Response: Therapist met with patient for follow up session. Therapist and patient discussed current stressors and coping skills used. Therapist offered to end session early to allow patient rest during work break. Pt was receptive.  Plan: Return again in 1 week.  Diagnosis: Axis I: Bipolar, mixed     Axis II: N/A  [name], LCSW, LCAS 01/30/2020

## 2020-02-06 ENCOUNTER — Encounter: Payer: Self-pay | Admitting: Licensed Clinical Social Worker

## 2020-02-06 ENCOUNTER — Ambulatory Visit (INDEPENDENT_AMBULATORY_CARE_PROVIDER_SITE_OTHER): Payer: BC Managed Care – PPO | Admitting: Licensed Clinical Social Worker

## 2020-02-06 ENCOUNTER — Other Ambulatory Visit: Payer: Self-pay

## 2020-02-06 DIAGNOSIS — F3162 Bipolar disorder, current episode mixed, moderate: Secondary | ICD-10-CM | POA: Diagnosis not present

## 2020-02-06 NOTE — Progress Notes (Signed)
Patient Location: Home  Provider Location: Home Office   Virtual Visit via Video Note  I connected with Misty Peters on 02/06/20 at  2:00 PM EDT by a video enabled telemedicine application and verified that I am speaking with the correct person using two identifiers.   I discussed the limitations of evaluation and management by telemedicine and the availability of in person appointments. The patient expressed understanding and agreed to proceed.  THERAPY PROGRESS NOTE  Session Time: 30 Minutes  Participation Level: Active  Behavioral Response: Well GroomedAlertEuthymic  Type of Therapy: Individual Therapy  Treatment Goals addressed: Coping  Interventions: CBT  Summary: Misty Peters is a 24 y.o. female who presents with depression and anxiety sxs. Pt reported she had a better week since last session. Pt identified coping skills and pleasant activities that she engaged in and feeling less stressed "not doing too much for others and doing more for myself". Pt described interests and passions.  Suicidal/Homicidal: No  Therapist Response: Therapist met with patient for follow up session. Therapist and patient discussed current coping skills used as well as patient interests and passions.  Plan: Return again in 1 week.  Diagnosis: Axis I: Bipolar, mixed    Axis II: N/A  [name], LCSW, LCAS 02/06/2020

## 2020-02-13 ENCOUNTER — Encounter: Payer: BC Managed Care – PPO | Admitting: Licensed Clinical Social Worker

## 2020-02-13 ENCOUNTER — Telehealth: Payer: Self-pay | Admitting: Licensed Clinical Social Worker

## 2020-02-13 ENCOUNTER — Other Ambulatory Visit: Payer: Self-pay

## 2020-02-13 NOTE — Telephone Encounter (Signed)
Therapist sent invite link for today's session to patient via email and text. Pt responded on camera with her mic muted and used chat function. Pt s/ that she has a conflict with her usual schedule due to school starting and will need to reschedule. Therapist and patient agreed to communicate via email about patient's new schedule for next appointment.

## 2020-02-13 NOTE — Progress Notes (Signed)
This encounter was created in error - please disregard.

## 2020-02-20 ENCOUNTER — Ambulatory Visit (INDEPENDENT_AMBULATORY_CARE_PROVIDER_SITE_OTHER): Payer: BC Managed Care – PPO | Admitting: Licensed Clinical Social Worker

## 2020-02-20 ENCOUNTER — Other Ambulatory Visit: Payer: Self-pay

## 2020-02-20 ENCOUNTER — Encounter: Payer: Self-pay | Admitting: Licensed Clinical Social Worker

## 2020-02-20 DIAGNOSIS — F3162 Bipolar disorder, current episode mixed, moderate: Secondary | ICD-10-CM

## 2020-02-20 NOTE — Progress Notes (Signed)
Patient Location: [facility] Provider Location: Home Office   Virtual Visit via Video Note  I connected with Aleyna Cueva on 02/20/20 at  2:00 PM EDT by a video enabled telemedicine application and verified that I am speaking with the correct person using two identifiers.   I discussed the limitations of evaluation and management by telemedicine and the availability of in person appointments. The patient expressed understanding and agreed to proceed.  THERAPY PROGRESS NOTE  Session Time: 15 Minutes  Participation Level: Active  Behavioral Response: Well GroomedAlertAnxious  Type of Therapy: Individual Therapy  Treatment Goals addressed: Anxiety and Coping  Interventions: CBT  Summary: Tytiana Coles is a 24 y.o. female who presents with depression and anxiety sxs. Pt reported she is doing well since school started and continues to work as a Pharmacist, hospital. Pt reported it continues to be enjoyable, however maintains experiencing some level of anxiety and issues with sleep. Pt reported waking up multiple times per night and worried about taking some of the medication due to drowsy side effects that has made her miss alarm and wake up late. Pt reported no SI and is comfortable moving from weekly to every other week sessions to accommodate her full schedule.   Suicidal/Homicidal: No  Therapist Response: Therapist met with patient for follow up session. Therapist and patient discussed sxs and coping. Therapist provided psychoeducation around sleep hygiene. Pt was receptive and s/ she will try some of the suggestions to help with sleep.  Plan: Return again in 2 weeks.  Diagnosis: Axis I: Bipolar, mixed    Axis II: N/A  [name], LCSW, LCAS 02/20/2020

## 2020-02-22 ENCOUNTER — Telehealth: Payer: BC Managed Care – PPO | Admitting: Psychiatry

## 2020-02-22 ENCOUNTER — Other Ambulatory Visit: Payer: Self-pay

## 2020-03-05 ENCOUNTER — Ambulatory Visit (INDEPENDENT_AMBULATORY_CARE_PROVIDER_SITE_OTHER): Payer: No Typology Code available for payment source | Admitting: Licensed Clinical Social Worker

## 2020-03-05 ENCOUNTER — Encounter: Payer: Self-pay | Admitting: Licensed Clinical Social Worker

## 2020-03-05 ENCOUNTER — Other Ambulatory Visit: Payer: Self-pay

## 2020-03-05 DIAGNOSIS — F3162 Bipolar disorder, current episode mixed, moderate: Secondary | ICD-10-CM | POA: Diagnosis not present

## 2020-03-05 DIAGNOSIS — F4310 Post-traumatic stress disorder, unspecified: Secondary | ICD-10-CM | POA: Diagnosis not present

## 2020-03-05 NOTE — Progress Notes (Signed)
Patient Location: Worksite  Provider Location: Home Office   Virtual Visit via Video Note  I connected with Misty Peters on 03/05/20 at  1:00 PM EDT by a video enabled telemedicine application and verified that I am speaking with the correct person using two identifiers.   I discussed the limitations of evaluation and management by telemedicine and the availability of in person appointments. The patient expressed understanding and agreed to proceed.  THERAPY PROGRESS NOTE  Session Time: 30 Minutes  Participation Level: Active  Behavioral Response: Well GroomedAlertEuthymic  Type of Therapy: Individual Therapy  Treatment Goals addressed: Anxiety, Communication: Assertiveness and Coping  Interventions: CBT  Summary: Misty Peters is a 24 y.o. female who presents with depression and anxiety sxs. 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 Pt reported "I love what I do" and also acknowledged feeling physically exhausted. Pt reported she has been helping her father with projects on the weekends and "volunteered" for events by her boyfriend. Pt was receptive to working on communicating her needs with support systems and learning to say no assertively.  Suicidal/Homicidal: No  Therapist Response: Therapist met with patient for follow up session. Therapist and patient discussed stressors and balancing demands. Therapist encouraged patient to communicate needs assertively while also prioritizing her own needs to recharge and cope with her busy schedule.  Plan: Return again in 2 weeks.  Diagnosis: Axis I: Bipolar, mixed and Post Traumatic Stress Disorder    Axis II: N/A    [name], LCSW, LCAS 03/05/2020

## 2020-03-18 ENCOUNTER — Other Ambulatory Visit: Payer: Self-pay

## 2020-03-18 ENCOUNTER — Encounter: Payer: Self-pay | Admitting: Licensed Clinical Social Worker

## 2020-03-18 ENCOUNTER — Ambulatory Visit (INDEPENDENT_AMBULATORY_CARE_PROVIDER_SITE_OTHER): Payer: No Typology Code available for payment source | Admitting: Licensed Clinical Social Worker

## 2020-03-18 DIAGNOSIS — F4310 Post-traumatic stress disorder, unspecified: Secondary | ICD-10-CM

## 2020-03-18 DIAGNOSIS — F3162 Bipolar disorder, current episode mixed, moderate: Secondary | ICD-10-CM
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
## 2020-03-21 ENCOUNTER — Telehealth (INDEPENDENT_AMBULATORY_CARE_PROVIDER_SITE_OTHER): Payer: No Typology Code available for payment source | Admitting: Psychiatry

## 2020-03-21 ENCOUNTER — Encounter: Payer: Self-pay | Admitting: Psychiatry

## 2020-03-21 ENCOUNTER — Other Ambulatory Visit: Payer: Self-pay

## 2020-03-21 DIAGNOSIS — F3162 Bipolar disorder, current episode mixed, moderate: Secondary | ICD-10-CM | POA: Diagnosis not present

## 2020-03-21 DIAGNOSIS — F4310 Post-traumatic stress disorder, unspecified: Secondary | ICD-10-CM

## 2020-03-21 MED ORDER — TRAZODONE HCL 50 MG PO TABS: 50.0000 mg | ORAL_TABLET | Freq: Every evening | ORAL | 1 refills | Status: DC | PRN

## 2020-03-21 MED ORDER — PRAZOSIN HCL 1 MG PO CAPS: 1.0000 mg | ORAL_CAPSULE | Freq: Every day | ORAL | 1 refills | Status: DC

## 2020-03-21 MED ORDER — HYDROXYZINE HCL 25 MG PO TABS: 12.5000 mg | ORAL_TABLET | Freq: Two times a day (BID) | ORAL | 1 refills | Status: DC | PRN

## 2020-03-21 MED ORDER — SERTRALINE HCL 100 MG PO TABS: 100.0000 mg | ORAL_TABLET | Freq: Every day | ORAL | 1 refills | Status: DC

## 2020-03-21 NOTE — Progress Notes (Signed)
Provider Location : [facility] Patient Location : Work  Participants: Patient , Provider Virtual Visit via Video Note  I connected with Misty Peters on 03/21/20 at  1:30 PM EDT by a video enabled telemedicine application and verified that I am speaking with the correct person using two identifiers.   I discussed the limitations of evaluation and management by telemedicine and the availability of in person appointments. The patient expressed understanding and agreed to proceed.     I discussed the assessment and treatment plan with the patient. The patient was provided an opportunity to ask questions and all were answered. The patient agreed with the plan and demonstrated an understanding of the instructions.   The patient was advised to call back or seek an in-person evaluation if the symptoms worsen or if the condition fails to improve as anticipated.  BH MD OP Progress Note  03/21/2020 6:10 PM Misty Skeeternna Marie Knudtson  MRN:  [id number]  Chief Complaint:  Chief Complaint    Follow-up     HPI: Misty Skeeternna Marie Printz is a 24 year old Caucasian female, student at [employer], currently doing her placement at a middle school, has a history of PTSD, bipolar disorder, was evaluated by telemedicine today.  Patient's last appointment with writer was on 12/28/2019.  She today returns reporting her mood symptoms are stable.  She denies any significant manic or depressive episodes.  She does have a history of trauma.  She reports she continues to have vivid dreams.  She does not think this is affecting her sleep.  She however reports she is always tired during the day.  This has been going on since she was a child.  She takes the trazodone for sleep as needed.  She continues to be in therapy with Ms. Juanito DoomLauren Oreilly.  She reports therapy sessions as beneficial.  Patient does have a history of chronic suicidal thoughts.  She reports she has it at the back of her mind all the time however she is able to  distract herself and she is not dwelling on it like she used to before.  Denies any recent suicidal plans.  Patient denies any perceptual disturbances.  Denies homicidality.  Denies substance abuse problems.  Patient is compliant on her Zoloft.  Patient denies any other concerns today.  Visit Diagnosis:    ICD-10-CM   1. Bipolar disorder, current episode mixed, moderate (HCC)  F31.62 traZODone (DESYREL) 50 MG tablet    hydrOXYzine (ATARAX/VISTARIL) 25 MG tablet  2. PTSD (post-traumatic stress disorder)  F43.10 prazosin (MINIPRESS) 1 MG capsule    traZODone (DESYREL) 50 MG tablet    sertraline (ZOLOFT) 100 MG tablet    hydrOXYzine (ATARAX/VISTARIL) 25 MG tablet    Past Psychiatric History: I have reviewed past psychiatric history from my progress note on 11/17/2019.  Past trials of Zoloft, trazodone, lithium  Past Medical History:  Past Medical History:  Diagnosis Date  . Anxiety   . Bipolar disorder with depression (HCC) 11/07/2015  . Depression   . Headache     Past Surgical History:  Procedure Laterality Date  . GUM SURGERY    . SALIVARY STONE REMOVAL    . WISDOM TOOTH EXTRACTION      Family Psychiatric History: I have reviewed family psychiatric history from my progress note on 11/17/2019  Family History:  Family History  Problem Relation Age of Onset  . Hyperlipidemia Father   . Hypertension Father   . Diabetes Father   . Heart disease Paternal Grandmother   .  Stroke Paternal Grandmother   . Cancer Paternal Grandmother        lung and breast  . Bipolar disorder Paternal Grandmother   . ADD / ADHD Paternal Grandmother   . Cancer Maternal Grandmother        breast  . Bipolar disorder Paternal Aunt   . Drug abuse Other   . COPD Neg Hx     Social History: I have reviewed social history from my progress note on 11/17/2019 Social History   Socioeconomic History  . Marital status: Single    Spouse name: Not on file  . Number of children: 0  . Years of  education: 18  . Highest education level: 10th grade  Occupational History  . Not on file  Tobacco Use  . Smoking status: Never Smoker  . Smokeless tobacco: Never Used  Vaping Use  . Vaping Use: Never used  Substance and Sexual Activity  . Alcohol use: No    Alcohol/week: 0.0 standard drinks  . Drug use: No  . Sexual activity: Not Currently  Other Topics Concern  . Not on file  Social History Narrative   Born and raised in [place] by both parents   1 older brother   Never married and no kids      Lives with parents and brother in [place]   Social Determinants of Health   Financial Resource Strain:   . Difficulty of Paying Living Expenses: Not on file  Food Insecurity:   . Worried About Programme researcher, broadcasting/film/video in the Last Year: Not on file  . Ran Out of Food in the Last Year: Not on file  Transportation Needs:   . Lack of Transportation (Medical): Not on file  . Lack of Transportation (Non-Medical): Not on file  Physical Activity:   . Days of Exercise per Week: Not on file  . Minutes of Exercise per Session: Not on file  Stress:   . Feeling of Stress : Not on file  Social Connections:   . Frequency of Communication with Friends and Family: Not on file  . Frequency of Social Gatherings with Friends and Family: Not on file  . Attends Religious Services: Not on file  . Active Member of Clubs or Organizations: Not on file  . Attends Banker Meetings: Not on file  . Marital Status: Not on file    Allergies: No Known Allergies  Metabolic Disorder Labs: No results found for: HGBA1C, MPG No results found for: PROLACTIN Lab Results  Component Value Date   CHOL 163 09/20/2019   TRIG 316 (H) 09/20/2019   HDL 41 09/20/2019   CHOLHDL 3.1 02/07/2018   LDLCALC 72 09/20/2019   LDLCALC 83 02/07/2018   Lab Results  Component Value Date   TSH 2.960 09/20/2019   TSH 2.32 02/07/2018    Therapeutic Level Labs: No results found for: LITHIUM No results found for:  VALPROATE No components found for:  CBMZ  Current Medications: Current Outpatient Medications  Medication Sig Dispense Refill  . hydrOXYzine (ATARAX/VISTARIL) 25 MG tablet Take 0.5-1 tablets (12.5-25 mg total) by mouth 2 (two) times daily as needed. Severe anxiety attacks only 45 tablet 1  . ondansetron (ZOFRAN ODT) 4 MG disintegrating tablet Take 1 tablet (4 mg total) by mouth every 8 (eight) hours as needed. 30 tablet 0  . prazosin (MINIPRESS) 1 MG capsule Take 1 capsule (1 mg total) by mouth at bedtime. 30 capsule 1  . sertraline (ZOLOFT) 100 MG tablet Take 1  tablet (100 mg total) by mouth daily. 30 tablet 1  . traZODone (DESYREL) 50 MG tablet Take 1-1.5 tablets (50-75 mg total) by mouth at bedtime as needed for sleep. 45 tablet 1   No current facility-administered medications for this visit.     Musculoskeletal: Strength & Muscle Tone: UTA Gait & Station: normal Patient leans: N/A  Psychiatric Specialty Exam: Review of Systems  Psychiatric/Behavioral: Positive for sleep disturbance. The patient is nervous/anxious.   All other systems reviewed and are negative.   There were no vitals taken for this visit.There is no height or weight on file to calculate BMI.  General Appearance: Casual  Eye Contact:  Fair  Speech:  Normal Rate  Volume:  Normal  Mood:  Anxious improving  Affect:  Congruent  Thought Process:  Goal Directed and Descriptions of Associations: Intact  Orientation:  Full (Time, Place, and Person)  Thought Content: Logical   Suicidal Thoughts:  No  Homicidal Thoughts:  No  Memory:  Immediate;   Fair Recent;   Fair Remote;   Fair  Judgement:  Fair  Insight:  Fair  Psychomotor Activity:  Normal  Concentration:  Concentration: Fair and Attention Span: Fair  Recall:  [employer] of Knowledge: Fair  Language: Fair  Akathisia:  No  Handed:  Right  AIMS (if indicated): UTA  Assets:  Communication Skills Desire for Improvement Housing Social Support  ADL's:   Intact  Cognition: WNL  Sleep:  restless due to vivid dreams   Screenings: AUDIT     Admission (Discharged) from 08/13/2019 in [facility] IN[facility]IENT [facility]IORAL MEDICINE  Alcohol Use Disorder Identification Test Final Score (AUDIT) 0    GAD-7     Office Visit from 09/20/2019 in [facility] Office Visit from 03/10/2018 in [facility]  Total GAD-7 Score 6 12    PHQ2-9     Office Visit from 09/20/2019 in [facility] Office Visit from 03/10/2018 in [facility] Office Visit from 02/07/2018 in [facility] Office Visit from 11/10/2016 in [facility] Office Visit from 11/07/2015 in [facility]  PHQ-2 Total Score 6 6 6  0 6  PHQ-9 Total Score 15 23 15  -- 19       Assessment and Plan: Misty Peters is a 24 year old Caucasian female, student at [employer], currently working at a middle school, has a history of PTSD, MDD was evaluated by telemedicine today.  Patient is biologically predisposed given her history of trauma, family history of mental health problems.  Patient with psychosocial stressors of the current pandemic, being in school.  Patient with chronic self-injurious thoughts however currently denies any plan or intent.  Patient continues to struggle with sleep problems.  Plan as noted below.  Plan Bipolar disorder mixed-stable Continue hydroxyzine 12.5 to 25 mg p.o. daily as needed for severe anxiety/agitation Zoloft 100 mg p.o. daily Trazodone 50 to 75 mg p.o. nightly as needed  PTSD-improving Continue trazodone 50 to 75 mg p.o. nightly as needed Start prazosin 1 mg p.o. nightly for nightmares. Continue CBT with Ms. 25  Rule out borderline personality disorder-we will monitor closely  Follow-up in clinic in 4 weeks or sooner if needed.  I have spent atleast 20 minutes  face to face by video with patient today. More than 50 % of the time was spent for preparing to  see the patient ( e.g., review of test, records ), ordering medications and test ,psychoeducation and supportive psychotherapy and  care coordination,as well as documenting clinical information in electronic health record. This note was generated in part or whole with voice recognition software. Voice recognition is usually quite accurate but there are transcription errors that can and very often do occur. I apologize for any typographical errors that were not detected and corrected.      Jomarie Longs, MD 03/21/2020, 6:10 PM

## 2020-03-21 NOTE — Patient Instructions (Signed)
Prazosin capsules What is this medicine? PRAZOSIN (PRA zoe sin) is an antihypertensive. It works by relaxing the blood vessels. It is used to treat high blood pressure. This medicine may be used for other purposes; ask your health care provider or pharmacist if you have questions. COMMON BRAND NAME(S): Minipress What should I tell my health care provider before I take this medicine? They need to know if you have any of the following conditions:  kidney disease  an unusual or allergic reaction to prazosin, other medicines, foods, dyes, or preservatives  pregnant or trying to get pregnant  breast-feeding How should I use this medicine? Take this medicine by mouth with a glass of water. Follow the directions on the prescription label. Take your doses at regular intervals. Do not take your medicine more often than directed. Do not stop taking except on the advice of your doctor or health care professional. Talk to your pediatrician regarding the use of this medicine in children. Special care may be needed. Overdosage: If you think you have taken too much of this medicine contact a poison control center or emergency room at once. NOTE: This medicine is only for you. Do not share this medicine with others. What if I miss a dose? If you miss a dose, take it as soon as you can. If it is almost time for your next dose, take only that dose. Do not take double or extra doses. What may interact with this medicine?  diuretics  medicines for high blood pressure  sildenafil  tadalafil  vardenafil This list may not describe all possible interactions. Give your health care provider a list of all the medicines, herbs, non-prescription drugs, or dietary supplements you use. Also tell them if you smoke, drink alcohol, or use illegal drugs. Some items may interact with your medicine. What should I watch for while using this medicine? Visit your doctor or health care professional for regular checks on  your progress. Check your blood pressure regularly. Ask your doctor or health care professional what your blood pressure should be and when you should contact him or her. Drowsiness and dizziness are more likely to occur after the first dose, after an increase in dose, or during hot weather or exercise. These effects can decrease once your body adjusts to this medicine. Do not drive, use machinery, or do anything that needs mental alertness until you know how this drug affects you. Do not stand or sit up quickly, especially if you are an older patient. This reduces the risk of dizzy or fainting spells. Alcohol can make you more drowsy and dizzy. Avoid alcoholic drinks. Do not treat yourself for coughs, colds or allergies without asking your doctor or health care professional for advice. Some ingredients can increase your blood pressure. Your mouth may get dry. Chewing sugarless gum or sucking hard candy, and drinking plenty of water may help. Contact your doctor if the problem does not go away or is severe. For males, contact your doctor or health care professional right away if you have an erection that lasts longer than 4 hours or if it becomes painful. This may be a sign of a serious problem and must be treated right away to prevent permanent damage. What side effects may I notice from receiving this medicine? Side effects that you should report to your doctor or health care professional as soon as possible:  blurred vision  difficulty breathing, shortness of breath  fainting spells, light headedness  fast or irregular heartbeat, palpitations   or chest pain  numbness in hands or feet  prolonged painful erection of the penis  swelling of the legs or ankles  unusually weak or tired Side effects that usually do not require medical attention (report to your doctor or health care professional if they continue or are bothersome):  constipation or diarrhea  headache  nausea  sexual  difficulties  stomach pain This list may not describe all possible side effects. Call your doctor for medical advice about side effects. You may report side effects to FDA at 1-800-FDA-1088. Where should I keep my medicine? Keep out of the reach of children. Store at room temperature between 15 and 30 degrees C (59 and 86 degrees F). Protect from light. Keep container tightly closed. Throw away any unused medicine after the expiration date. NOTE: This sheet is a summary. It may not cover all possible information. If you have questions about this medicine, talk to your doctor, pharmacist, or health care provider.  2020 Elsevier/Gold Standard (2013-12-04 09:13:50)  

## 2020-04-01 ENCOUNTER — Ambulatory Visit (INDEPENDENT_AMBULATORY_CARE_PROVIDER_SITE_OTHER): Payer: No Typology Code available for payment source | Admitting: Licensed Clinical Social Worker

## 2020-04-01 ENCOUNTER — Other Ambulatory Visit: Payer: Self-pay

## 2020-04-01 ENCOUNTER — Encounter: Payer: Self-pay | Admitting: Licensed Clinical Social Worker

## 2020-04-01 DIAGNOSIS — F3162 Bipolar disorder, current episode mixed, moderate: Secondary | ICD-10-CM | POA: Diagnosis not present

## 2020-04-01 DIAGNOSIS — F4310 Post-traumatic stress disorder, unspecified: Secondary | ICD-10-CM

## 2020-04-01 NOTE — Progress Notes (Signed)
Patient Location: Worksite  Provider Location: Home Office   Virtual Visit via Telephone Note  I connected with Misty Peters on 04/01/20 at  1:00 PM EDT by telephone and verified that I am speaking with the correct person using two identifiers.   I discussed the limitations, risks, security and privacy concerns of performing an evaluation and management service by telephone and the availability of in person appointments. I also discussed with the patient that there may be a patient responsible charge related to this service. The patient expressed understanding and agreed to proceed.  THERAPY PROGRESS NOTE  Session Time: 30 Minutes  Participation Level: Active  Behavioral Response: AlertEuthymic  Type of Therapy: Individual Therapy  Treatment Goals addressed: Coping and Diagnosis: Bipolar Dx  Interventions: CBT  Summary: Misty Peters is a 25 y.o. female who presents with Bipolar and PTSD dx. 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 Pt reported she took some time off this weekend with her boyfriend and cutting back on spending. Pt reported ways she tries to cope with impulsive decisions and memory issues.   Suicidal/Homicidal: No  Therapist Response: Therapist met with patient for follow up session. Therapist and patient explored impulsivity around spending habits and using cues and other tools to help with memory and habit formation. Pt was receptive.  Plan: Return again in 2 weeks.  Diagnosis: Axis I: Bipolar, mixed and Post Traumatic Stress Disorder    Axis II: N/A  [name], LCSW, LCAS 04/01/2020

## 2020-04-15 ENCOUNTER — Ambulatory Visit: Payer: No Typology Code available for payment source | Admitting: Licensed Clinical Social Worker

## 2020-04-29 ENCOUNTER — Ambulatory Visit (INDEPENDENT_AMBULATORY_CARE_PROVIDER_SITE_OTHER): Payer: No Typology Code available for payment source | Admitting: Licensed Clinical Social Worker

## 2020-04-29 ENCOUNTER — Encounter: Payer: Self-pay | Admitting: Licensed Clinical Social Worker

## 2020-04-29 ENCOUNTER — Other Ambulatory Visit: Payer: Self-pay

## 2020-04-29 DIAGNOSIS — F4310 Post-traumatic stress disorder, unspecified: Secondary | ICD-10-CM | POA: Diagnosis not present

## 2020-04-29 DIAGNOSIS — F3162 Bipolar disorder, current episode mixed, moderate: Secondary | ICD-10-CM

## 2020-04-29 NOTE — Progress Notes (Signed)
Virtual Visit via Video Note  I connected with Misty Peters on 04/29/20 at  1:00 PM EST by a video enabled telemedicine application and verified that I am speaking with the correct person using two identifiers.  Location: Patient: Worksite  Provider: Home Office   I discussed the limitations of evaluation and management by telemedicine and the availability of in person appointments. The patient expressed understanding and agreed to proceed.  THERAPY PROGRESS NOTE  Session Time: 30 Minutes  Participation Level: Active  Behavioral Response: Well GroomedAlertEuthymic  Type of Therapy: Individual Therapy  Treatment Goals addressed: Anxiety and Coping  Interventions: CBT  Summary: Misty Peters is a 24 y.o. female who presents with Bipolar and PTSD dx. 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 Pt reported that she is looking forward to some time off for the holidays to spend time with family. Pt went on a trip with her boyfriend last weekend to [place]. Pt reported she forgot to pack and take her medication for those 3 days, but is back on schedule. Pt reported she did not experience any major changes in sxs, except "maybe feeling a little more easily annoyed". 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  Suicidal/Homicidal: No  Therapist Response: Therapist met with patient for follow up session. Therapist and patient explored patient reduction in sxs, use of coping skills and supports. Therapist and patient agreed to reduce frequency of sessions from every 2 weeks to once per month.  Plan: Return again in 1 month.  Diagnosis: Axis I: Bipolar, mixed and Post Traumatic Stress Disorder    Axis II: N/A  [name], LCSW,  LCAS 04/29/2020

## 2020-05-02 ENCOUNTER — Other Ambulatory Visit: Payer: Self-pay

## 2020-05-02 ENCOUNTER — Encounter: Payer: Self-pay | Admitting: Psychiatry

## 2020-05-02 ENCOUNTER — Telehealth (INDEPENDENT_AMBULATORY_CARE_PROVIDER_SITE_OTHER): Payer: No Typology Code available for payment source | Admitting: Psychiatry

## 2020-05-02 DIAGNOSIS — F3162 Bipolar disorder, current episode mixed, moderate: Secondary | ICD-10-CM

## 2020-05-02 DIAGNOSIS — F3178 Bipolar disorder, in full remission, most recent episode mixed: Secondary | ICD-10-CM

## 2020-05-02 DIAGNOSIS — F4310 Post-traumatic stress disorder, unspecified: Secondary | ICD-10-CM | POA: Diagnosis not present

## 2020-05-02 NOTE — Progress Notes (Signed)
Virtual Visit via Video Note  I connected with Misty Peters on 05/02/20 at  2:00 PM EST by a video enabled telemedicine application and verified that I am speaking with the correct person using two identifiers.  Location Provider Location : [facility] Patient Location : Home  Participants: Patient , Provider   I discussed the limitations of evaluation and management by telemedicine and the availability of in person appointments. The patient expressed understanding and agreed to proceed.     I discussed the assessment and treatment plan with the patient. The patient was provided an opportunity to ask questions and all were answered. The patient agreed with the plan and demonstrated an understanding of the instructions.   The patient was advised to call back or seek an in-person evaluation if the symptoms worsen or if the condition fails to improve as anticipated.   BH MD OP Progress Note  05/02/2020 2:32 PM Misty Peters  MRN:  [id number]  Chief Complaint:  Chief Complaint    Follow-up     HPI: Misty Peters and her is a 24 year old Caucasian female, student at [employer], has a history of PTSD, bipolar disorder was evaluated by telemedicine today.  Patient today reports she is currently doing well with regards to her mood.  Denies any significant manic symptoms.  Denies any depression.  She reports she continues to have chronic self-injurious thoughts on and off.  She had a fleeting thought recently however she was able to distract herself.  She currently denies any suicidal thoughts.  Patient is compliant on medications.  She continues to have nightmares at least couple of times a week.  She however reports it does not distress her or affect her sleep.  She has been using the prazosin only as needed.  She does not believe she needs it at this time.  Patient continues to be compliant with therapy and reports therapy sessions as beneficial.  Patient denies any  other concerns.  Visit Diagnosis: R/O Personality disorder   ICD-10-CM   1. Bipolar disorder, in full remission, most recent episode mixed (HCC)  F31.78   2. PTSD (post-traumatic stress disorder)  F43.10     Past Psychiatric History: I have reviewed past psychiatric history from my progress note on 11/17/2019.  Past trials of Zoloft, trazodone, lithium  Past Medical History:  Past Medical History:  Diagnosis Date  . Anxiety   . Bipolar disorder with depression (HCC) 11/07/2015  . Depression   . Headache     Past Surgical History:  Procedure Laterality Date  . GUM SURGERY    . SALIVARY STONE REMOVAL    . WISDOM TOOTH EXTRACTION      Family Psychiatric History: I have reviewed family psychiatric history from my progress note on 11/17/2019  Family History:  Family History  Problem Relation Age of Onset  . Hyperlipidemia Father   . Hypertension Father   . Diabetes Father   . Heart disease Paternal Grandmother   . Stroke Paternal Grandmother   . Cancer Paternal Grandmother        lung and breast  . Bipolar disorder Paternal Grandmother   . ADD / ADHD Paternal Grandmother   . Cancer Maternal Grandmother        breast  . Bipolar disorder Paternal Aunt   . Drug abuse Other   . COPD Neg Hx     Social History: I have reviewed social history from my progress note on 11/17/2019 Social History   Socioeconomic History  .  Marital status: Single    Spouse name: Not on file  . Number of children: 0  . Years of education: 59  . Highest education level: 10th grade  Occupational History  . Not on file  Tobacco Use  . Smoking status: Never Smoker  . Smokeless tobacco: Never Used  Vaping Use  . Vaping Use: Never used  Substance and Sexual Activity  . Alcohol use: No    Alcohol/week: 0.0 standard drinks  . Drug use: No  . Sexual activity: Not Currently  Other Topics Concern  . Not on file  Social History Narrative   Born and raised in [place] by both parents   1 older brother    Never married and no kids      Lives with parents and brother in [place]   Social Determinants of Health   Financial Resource Strain:   . Difficulty of Paying Living Expenses: Not on file  Food Insecurity:   . Worried About Programme researcher, broadcasting/film/video in the Last Year: Not on file  . Ran Out of Food in the Last Year: Not on file  Transportation Needs:   . Lack of Transportation (Medical): Not on file  . Lack of Transportation (Non-Medical): Not on file  Physical Activity:   . Days of Exercise per Week: Not on file  . Minutes of Exercise per Session: Not on file  Stress:   . Feeling of Stress : Not on file  Social Connections:   . Frequency of Communication with Friends and Family: Not on file  . Frequency of Social Gatherings with Friends and Family: Not on file  . Attends Religious Services: Not on file  . Active Member of Clubs or Organizations: Not on file  . Attends Banker Meetings: Not on file  . Marital Status: Not on file    Allergies: No Known Allergies  Metabolic Disorder Labs: No results found for: HGBA1C, MPG No results found for: PROLACTIN Lab Results  Component Value Date   CHOL 163 09/20/2019   TRIG 316 (H) 09/20/2019   HDL 41 09/20/2019   CHOLHDL 3.1 02/07/2018   LDLCALC 72 09/20/2019   LDLCALC 83 02/07/2018   Lab Results  Component Value Date   TSH 2.960 09/20/2019   TSH 2.32 02/07/2018    Therapeutic Level Labs: No results found for: LITHIUM No results found for: VALPROATE No components found for:  CBMZ  Current Medications: Current Outpatient Medications  Medication Sig Dispense Refill  . azithromycin (ZITHROMAX) 250 MG tablet Take 250 mg by mouth daily.    . hydrOXYzine (ATARAX/VISTARIL) 25 MG tablet Take 0.5-1 tablets (12.5-25 mg total) by mouth 2 (two) times daily as needed. Severe anxiety attacks only 45 tablet 1  . prazosin (MINIPRESS) 1 MG capsule Take 1 capsule (1 mg total) by mouth at bedtime. 30 capsule 1  . sertraline  (ZOLOFT) 100 MG tablet Take 1 tablet (100 mg total) by mouth daily. 30 tablet 1  . traZODone (DESYREL) 50 MG tablet Take 1-1.5 tablets (50-75 mg total) by mouth at bedtime as needed for sleep. 45 tablet 1   No current facility-administered medications for this visit.     Musculoskeletal: Strength & Muscle Tone: UTA Gait & Station: UTA Patient leans: N/A  Psychiatric Specialty Exam: Review of Systems  Psychiatric/Behavioral: Negative for agitation, confusion, decreased concentration, dysphoric mood, hallucinations, self-injury, sleep disturbance and suicidal ideas. The patient is not nervous/anxious and is not hyperactive.   All other systems reviewed and are  negative.   There were no vitals taken for this visit.There is no height or weight on file to calculate BMI.  General Appearance: Casual  Eye Contact:  Fair  Speech:  Normal Rate  Volume:  Normal  Mood:  Euthymic  Affect:  Congruent  Thought Process:  Goal Directed and Descriptions of Associations: Intact  Orientation:  Full (Time, Place, and Person)  Thought Content: Logical   Suicidal Thoughts:  No  Homicidal Thoughts:  No  Memory:  Immediate;   Fair Recent;   Fair Remote;   Fair  Judgement:  Fair  Insight:  Fair  Psychomotor Activity:  Normal  Concentration:  Concentration: Fair and Attention Span: Fair  Recall:  [employer] of Knowledge: Fair  Language: Fair  Akathisia:  No  Handed:  Right  AIMS (if indicated): UTA  Assets:  Communication Skills Desire for Improvement Social Support  ADL's:  Intact  Cognition: WNL  Sleep:  Fair   Screenings: AUDIT     Admission (Discharged) from 08/13/2019 in [facility] IN[facility]IENT BEHAVIORAL MEDICINE  Alcohol Use Disorder Identification Test Final Score (AUDIT) 0    GAD-7     Office Visit from 09/20/2019 in [facility] Office Visit from 03/10/2018 in [facility]  Total GAD-7 Score 6 12    PHQ2-9     Office Visit from 09/20/2019 in [facility] Office Visit from 03/10/2018 in [facility] Office Visit from 02/07/2018 in [facility] Office Visit from 11/10/2016 in [facility] Office Visit from 11/07/2015 in [facility]  PHQ-2 Total Score 6 6 6  0 6  PHQ-9 Total Score 15 23 15  -- 19       Assessment and Plan: Misty Peters is a 24 year old Caucasian female, student at [employer], has a history of bipolar disorder, PTSD was evaluated by telemedicine today.  Patient is biologically predisposed given her history of trauma, family history of mental health problems.  Patient with psychosocial stressors of current pandemic, being in school.  Patient with chronic self-injurious thoughts however currently is coping well and denies any current suicidality.  Discussed plan as noted below.  Plan Bipolar disorder in remission Zoloft 100 mg p.o. daily Trazodone 50 to 75 mg p.o. nightly as needed   PTSD-stable Trazodone 50 to 75 mg p.o. nightly as needed Prazosin 1 mg p.o. nightly for nightmares however she has been noncompliant Continue CBT with Ms. 25  Rule out borderline personality disorder-we will monitor closely  Follow-up in clinic in 2 months or sooner if needed.  I have spent atleast 20 minutes face to face by video with patient today. More than 50 % of the time was spent for preparing to see the patient ( e.g., review of test, records ),ordering medications and test ,psychoeducation and supportive psychotherapy and care coordination,as well as documenting clinical information in electronic health record. This note was generated in part or whole with voice recognition software. Voice recognition is usually quite accurate but there are transcription errors that can and very often do occur. I apologize for any typographical errors that were not detected and corrected.       [facility], MD 05/03/2020, 9:04 AM

## 2020-05-31 ENCOUNTER — Other Ambulatory Visit: Payer: Self-pay

## 2020-05-31 ENCOUNTER — Ambulatory Visit: Payer: No Typology Code available for payment source | Admitting: Licensed Clinical Social Worker

## 2020-06-20 ENCOUNTER — Encounter: Payer: Self-pay | Admitting: Licensed Clinical Social Worker

## 2020-06-20 ENCOUNTER — Ambulatory Visit (INDEPENDENT_AMBULATORY_CARE_PROVIDER_SITE_OTHER): Payer: No Typology Code available for payment source | Admitting: Licensed Clinical Social Worker

## 2020-06-20 ENCOUNTER — Other Ambulatory Visit: Payer: Self-pay

## 2020-06-20 DIAGNOSIS — F4310 Post-traumatic stress disorder, unspecified: Secondary | ICD-10-CM | POA: Diagnosis not present

## 2020-06-20 DIAGNOSIS — F3162 Bipolar disorder, current episode mixed, moderate: Secondary | ICD-10-CM | POA: Diagnosis not present

## 2020-06-20 NOTE — Progress Notes (Signed)
Virtual Visit via Telephone Note  I connected with Misty Peters on 06/20/20 at  9:00 AM EST by telephone and verified that I am speaking with the correct person using two identifiers.  Participating Parties Patient Provider  Location: Patient: Home Provider: Home Office   I discussed the limitations, risks, security and privacy concerns of performing an evaluation and management service by telephone and the availability of in person appointments. I also discussed with the patient that there may be a patient responsible charge related to this service. The patient expressed understanding and agreed to proceed.  THERAPY PROGRESS NOTE  Session Time: 30 Minutes  Participation Level: Active  Behavioral Response: AlertAnxious  Type of Therapy: Individual Therapy  Treatment Goals addressed: Coping  Interventions: CBT  Summary: Misty Peters is a 24 y.o. female who presents with Bipolar and PTSD dx. 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 Pt reported depression sxs are less intense and frequent. Pt reported "it is easier to look past it". Pt reported she engages in some impulsive shopping, however is able to utilize supports for accountability to curb this behavior. Pt reported she continues to experience passive SI at times, with no plans or intent to act on these thoughts. Pt reported they often seem to come out of the blue or "when I get annoyed". Pt reported her fianc is a "health nut" and is encouraging her to engage in healthier eating habits as well as exercise. Pt continues to be compliant with medication and reported decrease in tiredness and no noticeable side effects at this time. Pt is requesting letter for her ESA, however therapist is unable to provide proper evaluation for necessity of ESA as it is outside the scope of this therapist's practice.  Suicidal/Homicidal: Yeswithout  intent/plan  Therapist Response: Therapist met with patient for follow up session. Therapist and patient reviewed progress towards goals and continued barriers for 3 month treatment plan update. Therapist and patient discussed sxs and utilization of coping skills. Therapist provided information regarding guidelines for ESA.  Plan: Return again in 1 month.  Diagnosis: Axis I: Bipolar, mixed and Post Traumatic Stress Disorder    Axis II: N/A  [name], LCSW, LCAS 06/20/2020  Follow Up Instructions:  I discussed the assessment and treatment plan with the patient. The patient was provided an opportunity to ask questions and all were answered. The patient agreed with the plan and demonstrated an understanding of the instructions.   The patient was advised to call back or seek an in-person evaluation if the symptoms worsen or if the condition fails to improve as anticipated.  I provided 30 minutes of non-face-to-face time during this encounter.  Harlym Gehling Wynelle Link, LCSW, LCAS

## 2020-06-24 ENCOUNTER — Telehealth: Payer: Self-pay

## 2020-06-24 NOTE — Telephone Encounter (Signed)
Please advise 

## 2020-06-24 NOTE — Telephone Encounter (Signed)
Would need office visit, discussed with Dr. Laural Benes too.  Would need referral to therapist or psychiatry for these forms to be filled and further assessment.

## 2020-06-24 NOTE — Telephone Encounter (Signed)
Copied from CRM [id number]. 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

## 2020-06-24 NOTE — Telephone Encounter (Signed)
PT Scheduled for apt. Pt verbalized understanding

## 2020-06-27 ENCOUNTER — Telehealth (INDEPENDENT_AMBULATORY_CARE_PROVIDER_SITE_OTHER): Payer: No Typology Code available for payment source | Admitting: Psychiatry

## 2020-06-27 ENCOUNTER — Other Ambulatory Visit: Payer: Self-pay

## 2020-06-27 ENCOUNTER — Encounter: Payer: Self-pay | Admitting: Psychiatry

## 2020-06-27 ENCOUNTER — Telehealth: Payer: Self-pay

## 2020-06-27 DIAGNOSIS — F3176 Bipolar disorder, in full remission, most recent episode depressed: Secondary | ICD-10-CM

## 2020-06-27 DIAGNOSIS — F4310 Post-traumatic stress disorder, unspecified: Secondary | ICD-10-CM

## 2020-06-27 MED ORDER — HYDROXYZINE HCL 25 MG PO TABS: 12.5000 mg | ORAL_TABLET | Freq: Two times a day (BID) | ORAL | 1 refills | Status: DC | PRN

## 2020-06-27 MED ORDER — PRAZOSIN HCL 1 MG PO CAPS: 1.0000 mg | ORAL_CAPSULE | Freq: Every day | ORAL | 1 refills | Status: DC

## 2020-06-27 MED ORDER — SERTRALINE HCL 100 MG PO TABS: 100.0000 mg | ORAL_TABLET | Freq: Every day | ORAL | 1 refills | Status: DC

## 2020-06-27 NOTE — Telephone Encounter (Signed)
Medication management - Pt. left a message she would be moving in an apartment and would like to get a letter for her dog to move in with her as her animal support.  Patient requests a letter be provided in support of this by Dr. Elna Breslow.

## 2020-06-27 NOTE — Progress Notes (Signed)
Virtual Visit via Video Note  I connected with Misty Peters on 06/27/20 at  2:30 PM EST by a video enabled telemedicine application and verified that I am speaking with the correct person using two identifiers.  Location Provider Location : [facility] Patient Location : Work  Participants: Patient , Provider   I discussed the limitations of evaluation and management by telemedicine and the availability of in person appointments. The patient expressed understanding and agreed to proceed.    I discussed the assessment and treatment plan with the patient. The patient was provided an opportunity to ask questions and all were answered. The patient agreed with the plan and demonstrated an understanding of the instructions.   The patient was advised to call back or seek an in-person evaluation if the symptoms worsen or if the condition fails to improve as anticipated.  BH MD OP Progress Note  06/27/2020 3:25 PM Misty Peters  MRN:  [id number]  Chief Complaint:  Chief Complaint    Follow-up     HPI: Misty Peters is a 25 year old Caucasian female, student at [employer], has a history of PTSD, bipolar disorder was evaluated by telemedicine today.  Patient today reports she is going to graduate soon the end of this month.  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  She reports she is excited about starting soon.  Patient reports overall her mood is going well.  She does have episodes of mood swings especially during the time of her menstrual period.  She is overall able to cope with it.  She has not had any significant self-injurious thoughts recently.  She reports sleep and appetite as fair.  She reports she is taking the Zoloft as prescribed however ran out of it 2 days ago.  The prazosin and the trazodone she has been using it only as needed.  Patient reports she got engaged recently.  She and her fianc  are planning to move into a new apartment soon.  Patient denies any other concerns today.  Visit Diagnosis: R/O personality disorder   ICD-10-CM   1. Bipolar disorder, in full remission, most recent episode depressed (HCC)  F31.76 hydrOXYzine (ATARAX/VISTARIL) 25 MG tablet  2. PTSD (post-traumatic stress disorder)  F43.10 sertraline (ZOLOFT) 100 MG tablet    prazosin (MINIPRESS) 1 MG capsule    hydrOXYzine (ATARAX/VISTARIL) 25 MG tablet    Past Psychiatric History: I have reviewed past psychiatric history from my progress note on 11/17/2019.  Past trials of Zoloft, trazodone, lithium.  Past Medical History:  Past Medical History:  Diagnosis Date  . Anxiety   . Bipolar disorder with depression (HCC) 11/07/2015  . Depression   . Headache     Past Surgical History:  Procedure Laterality Date  . GUM SURGERY    . SALIVARY STONE REMOVAL    . WISDOM TOOTH EXTRACTION      Family Psychiatric History: I have reviewed family psychiatric history from my progress note on 11/17/2019  Family History:  Family History  Problem Relation Age of Onset  . Hyperlipidemia Father   . Hypertension Father   . Diabetes Father   . Heart disease Paternal Grandmother   . Stroke Paternal Grandmother   . Cancer Paternal Grandmother        lung and breast  . Bipolar disorder Paternal Grandmother   . ADD / ADHD Paternal Grandmother   . Cancer Maternal Grandmother  breast  . Bipolar disorder Paternal Aunt   . Drug abuse Other   . COPD Neg Hx     Social History: I have reviewed social history from my progress note on 11/17/2019 Social History   Socioeconomic History  . Marital status: Single    Spouse name: Not on file  . Number of children: 0  . Years of education: 75  . Highest education level: 10th grade  Occupational History  . Not on file  Tobacco Use  . Smoking status: Never Smoker  . Smokeless tobacco: Never Used  Vaping Use  . Vaping Use: Never used  Substance and Sexual  Activity  . Alcohol use: No    Alcohol/week: 0.0 standard drinks  . Drug use: No  . Sexual activity: Not Currently  Other Topics Concern  . Not on file  Social History Narrative   Born and raised in [place] by both parents   1 older brother   Never married and no kids      Lives with parents and brother in [place]   Social Determinants of Health   Financial Resource Strain: Not on file  Food Insecurity: Not on file  Transportation Needs: Not on file  Physical Activity: Not on file  Stress: Not on file  Social Connections: Not on file    Allergies: No Known Allergies  Metabolic Disorder Labs: No results found for: HGBA1C, MPG No results found for: PROLACTIN Lab Results  Component Value Date   CHOL 163 09/20/2019   TRIG 316 (H) 09/20/2019   HDL 41 09/20/2019   CHOLHDL 3.1 02/07/2018   LDLCALC 72 09/20/2019   LDLCALC 83 02/07/2018   Lab Results  Component Value Date   TSH 2.960 09/20/2019   TSH 2.32 02/07/2018    Therapeutic Level Labs: No results found for: LITHIUM No results found for: VALPROATE No components found for:  CBMZ  Current Medications: Current Outpatient Medications  Medication Sig Dispense Refill  . azithromycin (ZITHROMAX) 250 MG tablet Take 250 mg by mouth daily.    . hydrOXYzine (ATARAX/VISTARIL) 25 MG tablet Take 0.5-1 tablets (12.5-25 mg total) by mouth 2 (two) times daily as needed. Severe anxiety attacks only 45 tablet 1  . prazosin (MINIPRESS) 1 MG capsule Take 1 capsule (1 mg total) by mouth at bedtime. 30 capsule 1  . sertraline (ZOLOFT) 100 MG tablet Take 1 tablet (100 mg total) by mouth daily. 90 tablet 1  . traZODone (DESYREL) 50 MG tablet Take 1-1.5 tablets (50-75 mg total) by mouth at bedtime as needed for sleep. 45 tablet 1   No current facility-administered medications for this visit.     Musculoskeletal: Strength & Muscle Tone: UTA Gait & Station: normal Patient leans: N/A  Psychiatric Specialty Exam: Review of Systems   Psychiatric/Behavioral: Negative for agitation, behavioral problems, confusion, decreased concentration, dysphoric mood, hallucinations, self-injury, sleep disturbance and suicidal ideas. The patient is not nervous/anxious and is not hyperactive.   All other systems reviewed and are negative.   There were no vitals taken for this visit.There is no height or weight on file to calculate BMI.  General Appearance: Casual  Eye Contact:  Fair  Speech:  Clear and Coherent  Volume:  Normal  Mood:  Euthymic  Affect:  Full Range  Thought Process:  Goal Directed and Descriptions of Associations: Intact  Orientation:  Full (Time, Place, and Person)  Thought Content: Logical   Suicidal Thoughts:  No  Homicidal Thoughts:  No  Memory:  Immediate;  Fair Recent;   Fair Remote;   Fair  Judgement:  Fair  Insight:  Fair  Psychomotor Activity:  Normal  Concentration:  Concentration: Fair and Attention Span: Fair  Recall:  [employer] of Knowledge: Fair  Language: Fair  Akathisia:  No  Handed:  Right  AIMS (if indicated): UTA  Assets:  Communication Skills Desire for Improvement Housing Social Support  ADL's:  Intact  Cognition: WNL  Sleep:  Fair   Screenings: AUDIT   Flowsheet Row Admission (Discharged) from 08/13/2019 in [place]  Alcohol Use Disorder Identification Test Final Score (AUDIT) 0    GAD-7   Flowsheet Row Office Visit from 09/20/2019 in [facility] Office Visit from 03/10/2018 in [facility]  Total GAD-7 Score 6 12    PHQ2-9   [place] Office Visit from 09/20/2019 in [place] Visit from 03/10/2018 in [place] Visit from 02/07/2018 in [facility] Office Visit from 11/10/2016 in [facility] Office Visit from 11/07/2015 in [facility]  PHQ-2 Total Score 6 6 6  0 6  PHQ-9 Total Score 15 23 15  -- 19       Assessment and  Plan: Sabreena Vogan is a 25 year old Caucasian female, student at [employer], has a history of bipolar disorder, PTSD was evaluated by telemedicine today.  Patient is biologically predisposed given her history of trauma, family history of mental health problems.  Patient is currently making progress, is currently engaged, about to graduate and also was able to find a new job and doing well.  Discussed plan as noted below.  Plan Bipolar disorder in remission Zoloft 100 mg p.o. daily Trazodone 50 to 75 mg p.o. nightly as needed Discussed the need for compliance with medication.  PTSD-stable Zoloft 100 mg p.o. daily Trazodone 50 to 75 mg p.o. nightly as needed Prazosin 1 mg p.o. nightly for nightmares. Continue CBT with Ms. Zadie Rhine.  Rule out borderline personality disorder-we will continue to monitor and reassess.  Follow-up in clinic in 6 weeks or sooner if needed.  I have spent atleast 20 minutes face to face by video with patient today. More than 50 % of the time was spent for preparing to see the patient ( e.g., review of test, records ),  ordering medications and test ,psychoeducation and supportive psychotherapy and care coordination,as well as documenting clinical information in electronic health record. This note was generated in part or whole with voice recognition software. Voice recognition is usually quite accurate but there are transcription errors that can and very often do occur. I apologize for any typographical errors that were not detected and corrected.        Misty Alert, MD 06/27/2020, 3:25 PM

## 2020-06-28 NOTE — Telephone Encounter (Signed)
Letter prepared, reviewed, and signed by Dr.Eappen.  Letter emailed to patient through secure e-mail as requested to [email or web address].

## 2020-06-28 NOTE — Telephone Encounter (Signed)
Please give her a letter . She did say her animal is registered.

## 2020-06-28 NOTE — Telephone Encounter (Signed)
Medication management - Telephone call with pt to get her service dog ID # [phone] registered with [place] [employer] and to send letter of support to [address] apartment managers. Pt. would like letter emailed to [email or web address].

## 2020-07-02 ENCOUNTER — Ambulatory Visit: Payer: No Typology Code available for payment source | Admitting: Family Medicine

## 2020-07-15 ENCOUNTER — Other Ambulatory Visit: Payer: Self-pay

## 2020-07-15 ENCOUNTER — Ambulatory Visit (INDEPENDENT_AMBULATORY_CARE_PROVIDER_SITE_OTHER): Payer: No Typology Code available for payment source | Admitting: Licensed Clinical Social Worker

## 2020-07-15 ENCOUNTER — Encounter: Payer: Self-pay | Admitting: Licensed Clinical Social Worker

## 2020-07-15 DIAGNOSIS — F4310 Post-traumatic stress disorder, unspecified: Secondary | ICD-10-CM

## 2020-07-15 DIAGNOSIS — F3176 Bipolar disorder, in full remission, most recent episode depressed: Secondary | ICD-10-CM | POA: Diagnosis not present
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
## 2020-08-07 ENCOUNTER — Other Ambulatory Visit: Payer: Self-pay

## 2020-08-07 ENCOUNTER — Telehealth (INDEPENDENT_AMBULATORY_CARE_PROVIDER_SITE_OTHER): Payer: No Typology Code available for payment source | Admitting: Psychiatry

## 2020-08-07 DIAGNOSIS — F3176 Bipolar disorder, in full remission, most recent episode depressed: Secondary | ICD-10-CM

## 2020-08-07 NOTE — Progress Notes (Signed)
No response to call or text or video invite  

## 2020-08-12 ENCOUNTER — Ambulatory Visit (INDEPENDENT_AMBULATORY_CARE_PROVIDER_SITE_OTHER): Payer: No Typology Code available for payment source | Admitting: Licensed Clinical Social Worker

## 2020-08-12 ENCOUNTER — Other Ambulatory Visit: Payer: Self-pay

## 2020-08-12 ENCOUNTER — Encounter: Payer: Self-pay | Admitting: Licensed Clinical Social Worker

## 2020-08-12 DIAGNOSIS — F3130 Bipolar disorder, current episode depressed, mild or moderate severity, unspecified: Secondary | ICD-10-CM

## 2020-08-12 DIAGNOSIS — F4310 Post-traumatic stress disorder, unspecified: Secondary | ICD-10-CM

## 2020-08-12 NOTE — Progress Notes (Signed)
Virtual Visit via Video Note  I connected with Misty Peters on 08/12/20 at  1:00 PM EST by a video enabled telemedicine application and verified that I am speaking with the correct person using two identifiers.  Participating Parties Patient Provider  Location: Patient: Vehicle Provider: Home Office   I discussed the limitations of evaluation and management by telemedicine and the availability of in person appointments. The patient expressed understanding and agreed to proceed.  THERAPY PROGRESS NOTE  Session Time: 20 Minutes  Participation Level: Active  Behavioral Response: Well GroomedAlertAnxious and Depressed  Type of Therapy: Individual Therapy  Treatment Goals addressed: Coping  Interventions: Supportive  Summary: Misty Peters is a 25 y.o. female who presents with Bipolar and PTSD dx. Pt reported "things have been crazy" and referenced several stressors including having to take her mother to doctor's appointments due to declining health issues (thus needing to cut session short due to lack of privacy), working with students struggling with depression/conflicts at school, and having less time to decompress. Pt reported she has one last Spanish class to take for school and "will officially be done in March". Pt reported "I have moments I feel very numb and others where I want to crawl into a ball and cry". Pt reported what is working well is living in her apartment with fianc and finds showers soothing.   Suicidal/Homicidal: No  Therapist Response: Therapist met with patient for follow up. Therapist and patient explored current stressors and reactions to stress. Therapist assisted patient in identifying possible triggers and patterns of overwhelm related to current demands and expectations placed on self and by external factors. Pt was receptive. Pt reported due to busy schedule she has limited time to meet with therapist, however would like to schedule next session in  1 month.  Plan: Return again in 1 month.  Diagnosis: Axis I: Bipolar, Depressed and Post Traumatic Stress Disorder    Axis II: N/A  [name], LCSW, LCAS 08/12/2020

## 2020-08-20 ENCOUNTER — Encounter: Payer: Self-pay | Admitting: Psychiatry

## 2020-08-20 ENCOUNTER — Telehealth (INDEPENDENT_AMBULATORY_CARE_PROVIDER_SITE_OTHER): Payer: BC Managed Care – PPO | Admitting: Psychiatry

## 2020-08-20 ENCOUNTER — Other Ambulatory Visit: Payer: Self-pay

## 2020-08-20 DIAGNOSIS — F3176 Bipolar disorder, in full remission, most recent episode depressed: Secondary | ICD-10-CM

## 2020-08-20 DIAGNOSIS — F4310 Post-traumatic stress disorder, unspecified: Secondary | ICD-10-CM | POA: Diagnosis not present

## 2020-08-20 MED ORDER — HYDROXYZINE HCL 25 MG PO TABS: 12.5000 mg | ORAL_TABLET | Freq: Two times a day (BID) | ORAL | 1 refills | Status: DC | PRN

## 2020-08-20 MED ORDER — PRAZOSIN HCL 1 MG PO CAPS: 1.0000 mg | ORAL_CAPSULE | Freq: Every day | ORAL | 1 refills | Status: DC

## 2020-08-20 MED ORDER — SERTRALINE HCL 100 MG PO TABS: 100.0000 mg | ORAL_TABLET | Freq: Every day | ORAL | 1 refills | Status: DC

## 2020-08-20 NOTE — Progress Notes (Signed)
Virtual Visit via Video Note  I connected with Misty Peters on 08/20/20 at  1:30 PM EST by a video enabled telemedicine application and verified that I am speaking with the correct person using two identifiers.  Location Provider Location : [facility] Patient Location : Home  Participants: Patient , Provider   I discussed the limitations of evaluation and management by telemedicine and the availability of in person appointments. The patient expressed understanding and agreed to proceed.   I discussed the assessment and treatment plan with the patient. The patient was provided an opportunity to ask questions and all were answered. The patient agreed with the plan and demonstrated an understanding of the instructions.   The patient was advised to call back or seek an in-person evaluation if the symptoms worsen or if the condition fails to improve as anticipated.   BH MD OP Progress Note  08/20/2020 2:03 PM Misty Peters  MRN:  [id number]  Chief Complaint:  Chief Complaint    Follow-up     HPI: Misty Peters is a 25 year old Caucasian female, student at [employer], has a history of PTSD, bipolar disorder was evaluated by telemedicine today.  Patient reports she will be graduating in May.  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  She reports she enjoys her teaching job and work is a good outlet for her.  As long as she is with her kids she feels fine.  She reports she has not had any significant mood swings recently.  She reports sleep is overall okay.  She denies any vivid dreams or nightmares.  She is compliant on the prazosin which helps.  Patient reports she does have chronic suicidal thoughts however has not had any recently.  However recently she did think about what would have happened if she had successfully killed herself a year ago.  She reports a year ago she tried to cut herself and then tried to hang herself however was  unsuccessful and was taken to the hospital by her brother.  She has not had any such suicidal thoughts or intent recently.  Patient reports her fianc as well as her parents and brother as very supportive.  She can always reach out to them for support if she has any thoughts of suicide.  Patient reports therapy sessions is going well.  Patient denies any hypomanic or manic symptoms.  Denies any significant depression.  Patient denies any other concerns today. Visit Diagnosis:    ICD-10-CM   1. Bipolar disorder, in full remission, most recent episode depressed (HCC)  F31.76   2. PTSD (post-traumatic stress disorder)  F43.10 sertraline (ZOLOFT) 100 MG tablet    prazosin (MINIPRESS) 1 MG capsule    hydrOXYzine (ATARAX/VISTARIL) 25 MG tablet    Past Psychiatric History: I have reviewed past psychiatric history from my progress note on 11/17/2019.  Past trials of Zoloft, trazodone, lithium  Past Medical History:  Past Medical History:  Diagnosis Date  . Anxiety   . Bipolar disorder with depression (HCC) 11/07/2015  . Depression   . Headache     Past Surgical History:  Procedure Laterality Date  . GUM SURGERY    . SALIVARY STONE REMOVAL    . WISDOM TOOTH EXTRACTION      Family Psychiatric History: I have reviewed family psychiatric history from my progress note on 11/17/2019  Family History:  Family History  Problem Relation Age of Onset  . Hyperlipidemia Father   .  Hypertension Father   . Diabetes Father   . Heart disease Paternal Grandmother   . Stroke Paternal Grandmother   . Cancer Paternal Grandmother        lung and breast  . Bipolar disorder Paternal Grandmother   . ADD / ADHD Paternal Grandmother   . Cancer Maternal Grandmother        breast  . Bipolar disorder Paternal Aunt   . Drug abuse Other   . COPD Neg Hx     Social History: Reviewed social history from my progress note on 11/17/2019 Social History   Socioeconomic History  . Marital status: Single     Spouse name: Not on file  . Number of children: 0  . Years of education: 37  . Highest education level: 10th grade  Occupational History  . Not on file  Tobacco Use  . Smoking status: Never Smoker  . Smokeless tobacco: Never Used  Vaping Use  . Vaping Use: Never used  Substance and Sexual Activity  . Alcohol use: No    Alcohol/week: 0.0 standard drinks  . Drug use: No  . Sexual activity: Not Currently  Other Topics Concern  . Not on file  Social History Narrative   Born and raised in [place] by both parents   1 older brother   Never married and no kids      Lives with parents and brother in [place]   Social Determinants of Health   Financial Resource Strain: Not on file  Food Insecurity: Not on file  Transportation Needs: Not on file  Physical Activity: Not on file  Stress: Not on file  Social Connections: Not on file    Allergies: No Known Allergies  Metabolic Disorder Labs: No results found for: HGBA1C, MPG No results found for: PROLACTIN Lab Results  Component Value Date   CHOL 163 09/20/2019   TRIG 316 (H) 09/20/2019   HDL 41 09/20/2019   CHOLHDL 3.1 02/07/2018   LDLCALC 72 09/20/2019   LDLCALC 83 02/07/2018   Lab Results  Component Value Date   TSH 2.960 09/20/2019   TSH 2.32 02/07/2018    Therapeutic Level Labs: No results found for: LITHIUM No results found for: VALPROATE No components found for:  CBMZ  Current Medications: Current Outpatient Medications  Medication Sig Dispense Refill  . azithromycin (ZITHROMAX) 250 MG tablet Take 250 mg by mouth daily.    . hydrOXYzine (ATARAX/VISTARIL) 25 MG tablet Take 0.5-1 tablets (12.5-25 mg total) by mouth 2 (two) times daily as needed. Severe anxiety attacks only 45 tablet 1  . prazosin (MINIPRESS) 1 MG capsule Take 1 capsule (1 mg total) by mouth at bedtime. 30 capsule 1  . sertraline (ZOLOFT) 100 MG tablet Take 1 tablet (100 mg total) by mouth daily. 90 tablet 1  . traZODone (DESYREL) 50 MG tablet Take  1-1.5 tablets (50-75 mg total) by mouth at bedtime as needed for sleep. 45 tablet 1   No current facility-administered medications for this visit.     Musculoskeletal: Strength & Muscle Tone: UTA Gait & Station: UTA Patient leans: N/A  Psychiatric Specialty Exam: Review of Systems  Psychiatric/Behavioral: Negative for agitation, behavioral problems, confusion, decreased concentration, dysphoric mood, hallucinations, self-injury, sleep disturbance and suicidal ideas. The patient is not nervous/anxious and is not hyperactive.   All other systems reviewed and are negative.   There were no vitals taken for this visit.There is no height or weight on file to calculate BMI.  General Appearance: Casual  Eye  Contact:  Fair  Speech:  Normal Rate  Volume:  Normal  Mood:  Euthymic  Affect:  Congruent  Thought Process:  Goal Directed and Descriptions of Associations: Intact  Orientation:  Full (Time, Place, and Person)  Thought Content: Logical   Suicidal Thoughts:  No  Homicidal Thoughts:  No  Memory:  Immediate;   Fair Recent;   Fair Remote;   Fair  Judgement:  Fair  Insight:  Fair  Psychomotor Activity:  Normal  Concentration:  Concentration: Fair and Attention Span: Fair  Recall:  [employer] of Knowledge: Fair  Language: Fair  Akathisia:  No  Handed:  Right  AIMS (if indicated): UTA  Assets:  Communication Skills Desire for Improvement Housing Social Support  ADL's:  Intact  Cognition: WNL  Sleep:  Fair   Screenings: AUDIT   Flowsheet Row Admission (Discharged) from 08/13/2019 in [facility] INPATIENT BEHAVIORAL MEDICINE  Alcohol Use Disorder Identification Test Final Score (AUDIT) 0    GAD-7   Flowsheet Row Office Visit from 09/20/2019 in [facility] Office Visit from 03/10/2018 in [facility]  Total GAD-7 Score 6 12    PHQ2-9   Flowsheet Row Video Visit from 08/20/2020 in [facility] Office Visit from 09/20/2019 in [facility] Office Visit from 03/10/2018 in [facility] Office Visit from 02/07/2018 in [facility] Office Visit from 11/10/2016 in [facility]  PHQ-2 Total Score 0 6 6 6  0  PHQ-9 Total Score -- 15 23 15  --    Flowsheet Row Video Visit from 08/20/2020 in [facility] Most recent reading at 08/20/2020  1:40 PM Admission (Discharged) from 08/13/2019 in [facility] INPATIENT BEHAVIORAL MEDICINE Most recent reading at 08/13/2019 12:15 PM ED from 08/13/2019 in [facility] EMERGENCY DEPARTMENT Most recent reading at 08/13/2019  1:46 AM  C-SSRS RISK CATEGORY Low Risk High Risk High Risk       Assessment and Plan: Norita Meigs is a 25 year old Caucasian female, alert Misty Peters, has a history of bipolar disorder, PTSD was evaluated by telemedicine today.  Patient is biologically predisposed given history of trauma, family history of mental health problems.  Patient is currently stable on current medication regimen.  Plan as noted below.  Plan Bipolar disorder in remission Zoloft 100 mg p.o. daily Trazodone 50 to 75 mg p.o. nightly as needed  PTSD-stable Zoloft 100 mg p.o. daily Trazodone 50 to 75 mg p.o. nightly as needed Prazosin 1 mg p.o. nightly for nightmares Continue CBT with Ms. 25  Rule out borderline personality disorder-we will continue to monitor and reevaluate.  Safety plan in place for patient with chronic suicidality.  Follow-up in clinic in person in 2 months or sooner if needed.  I have spent atleast 20 minutes with patient today. More than 50 % of the time was spent for preparing to see the patient ( e.g., review of test, records ), ordering medications and test ,psychoeducation and supportive psychotherapy and care coordination,as well as documenting clinical information in electronic health record. This note was generated in part or whole with voice recognition  software. Voice recognition is usually quite accurate but there are transcription errors that can and very often do occur. I apologize for any typographical errors that were not detected and corrected.      [employer], MD 08/21/2020, 9:02 AM

## 2020-09-09 ENCOUNTER — Ambulatory Visit (INDEPENDENT_AMBULATORY_CARE_PROVIDER_SITE_OTHER): Payer: No Typology Code available for payment source | Admitting: Licensed Clinical Social Worker

## 2020-09-09 ENCOUNTER — Encounter: Payer: Self-pay | Admitting: Licensed Clinical Social Worker

## 2020-09-09 ENCOUNTER — Other Ambulatory Visit: Payer: Self-pay

## 2020-09-09 DIAGNOSIS — F3176 Bipolar disorder, in full remission, most recent episode depressed: Secondary | ICD-10-CM

## 2020-09-09 DIAGNOSIS — F4310 Post-traumatic stress disorder, unspecified: Secondary | ICD-10-CM | POA: Diagnosis not present
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
## 2020-10-08 ENCOUNTER — Ambulatory Visit (INDEPENDENT_AMBULATORY_CARE_PROVIDER_SITE_OTHER): Payer: BC Managed Care – PPO | Admitting: Licensed Clinical Social Worker

## 2020-10-08 ENCOUNTER — Encounter: Payer: Self-pay | Admitting: Licensed Clinical Social Worker

## 2020-10-08 ENCOUNTER — Other Ambulatory Visit: Payer: Self-pay

## 2020-10-08 DIAGNOSIS — F4310 Post-traumatic stress disorder, unspecified: Secondary | ICD-10-CM | POA: Diagnosis not present

## 2020-10-08 DIAGNOSIS — F3176 Bipolar disorder, in full remission, most recent episode depressed: Secondary | ICD-10-CM

## 2020-10-08 NOTE — Progress Notes (Signed)
Virtual Visit via Video Note  I connected with Misty Peters on 10/08/20 at 11:00 AM EDT by a video enabled telemedicine application and verified that I am speaking with the correct person using two identifiers.  Participating Parties Patient Provider  Location: Patient: Home Provider: Home Office   I discussed the limitations of evaluation and management by telemedicine and the availability of in person appointments. The patient expressed understanding and agreed to proceed.  THERAPY PROGRESS NOTE  Session Time: 10 Minutes  Participation Level: Minimal  Behavioral Response: CasualAlertEuthymic and Tired  Type of Therapy: Individual Therapy  Treatment Goals addressed: Anxiety and Coping  Interventions: Supportive  Summary: Misty Peters is a 25 y.o. female who presents with Bipolar and PTSD dx.  Pt reported that stressors have reduced since on spring break from work. Pt reported she is sleeping well and taking naps. 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 Pt reported no concerns at this time.   Suicidal/Homicidal: No  Therapist Response: Therapist met briefly with patient for follow up. Therapist and patient reviewed sxs and maintaining progress. Therapist and patient discussed options for continued care as this therapist will be leaving the practice on 10/11/20.  Plan: Return again as needed to resume therapy with new therapist.  Diagnosis: Axis I: Bipolar, Depressed and Post Traumatic Stress Disorder    Axis II: N/A  [name], LCSW, LCAS 10/08/2020

## 2020-10-15 ENCOUNTER — Ambulatory Visit: Payer: No Typology Code available for payment source | Admitting: Psychiatry

## 2020-10-25 ENCOUNTER — Other Ambulatory Visit: Payer: Self-pay

## 2020-10-25 ENCOUNTER — Ambulatory Visit
Admission: EM | Admit: 2020-10-25 | Discharge: 2020-10-25 | Disposition: A | Discharge status: Home / Self Care | Payer: BC Managed Care – PPO | Attending: Family Medicine | Admitting: Family Medicine

## 2020-10-25 ENCOUNTER — Ambulatory Visit (INDEPENDENT_AMBULATORY_CARE_PROVIDER_SITE_OTHER): Payer: BC Managed Care – PPO

## 2020-10-25 DIAGNOSIS — U071 COVID-19: Secondary | ICD-10-CM | POA: Diagnosis not present

## 2020-10-25 DIAGNOSIS — R059 Cough, unspecified: Secondary | ICD-10-CM

## 2020-10-25 DIAGNOSIS — R0602 Shortness of breath: Secondary | ICD-10-CM

## 2020-10-25 LAB — RESP PANEL BY RT-PCR (FLU A&B, COVID) ARPGX2
Influenza A by PCR: NEGATIVE
Influenza B by PCR: NEGATIVE
SARS Coronavirus 2 by RT PCR: POSITIVE — AB

## 2020-10-25 MED ORDER — ALBUTEROL SULFATE HFA 108 (90 BASE) MCG/ACT IN AERS: 2.0000 | INHALATION_SPRAY | RESPIRATORY_TRACT | 0 refills | Status: DC | PRN

## 2020-10-25 MED ORDER — AEROCHAMBER MV MISC: 2 refills | Status: DC

## 2020-10-25 MED ORDER — BENZONATATE 100 MG PO CAPS: 200.0000 mg | ORAL_CAPSULE | Freq: Three times a day (TID) | ORAL | 0 refills | Status: DC

## 2020-10-25 MED ORDER — PROMETHAZINE-DM 6.25-15 MG/5ML PO SYRP: 5.0000 mL | ORAL_SOLUTION | Freq: Four times a day (QID) | ORAL | 0 refills | Status: DC | PRN

## 2020-10-25 NOTE — Discharge Instructions (Addendum)
Your test today shows that you are positive for COVID-19.  You need to quarantine at home for 5 days from when your symptoms started.  After the 5 days you can break quarantine if your symptoms have improved and you have not had a fever for 24 hours without taking Tylenol or ibuprofen.  Use Tylenol or ibuprofen according to the package instructions as needed for body aches, headache, and fever.  Use the albuterol inhaler with spacer, 2 puffs every 4-6 hours, as needed for shortness of breath and wheezing.  Use the Tessalon Perles during the day as needed for cough and use the Promethazine DM cough syrup at nighttime as needed for cough and congestion.  The cough syrup will make you drowsy.  If you have any worsening shortness of breath, especially at rest, you feel that you cannot catch her breath, you cannot speak in full sentences, or as a late sign your lips start to turn blue you need to go to the ER for evaluation.

## 2020-10-25 NOTE — ED Provider Notes (Signed)
[facility]    CSN: [id number] Arrival date & time: 10/25/20  1309      History   Chief Complaint Chief Complaint  Patient presents with  . Generalized Body Aches    HPI Seylah Wernert is a 25 y.o. female.   HPI   25 year old female here for evaluation of COVID-like symptoms.  Patient reports that she has been experiencing headache, body aches, nasal congestion with intermittent green nasal discharge, scratchy throat, shortness of breath and wheezing with a nonproductive cough, abdominal pain, nausea, and diarrhea.  Patient reports that she has had 3 episodes of diarrhea stool.  She has not had any vomiting at present, no fever, and she denies ear pain or pressure.  Past Medical History:  Diagnosis Date  . Anxiety   . Bipolar disorder with depression (HCC) 11/07/2015  . Depression   . Headache     Patient Active Problem List   Diagnosis Date Noted  . Bipolar disorder, in full remission, most recent episode depressed (HCC) 08/07/2020  . Bipolar disorder, current episode mixed, moderate (HCC) 12/07/2019  . High risk medication use 11/17/2019  . MDD (major depressive disorder) 08/13/2019  . Bipolar I disorder, most recent episode depressed (HCC) 08/13/2019  . PTSD (post-traumatic stress disorder) 08/13/2019  . Suicide attempt (HCC)   . Migraine 03/13/2018  . Suicidal ideations 02/08/2018  . Encounter for BCP (birth control pills) initial prescription 11/15/2016  . Bipolar affective disorder, depressed, severe (HCC)   . Anxiety     Past Surgical History:  Procedure Laterality Date  . GUM SURGERY    . SALIVARY STONE REMOVAL    . WISDOM TOOTH EXTRACTION      OB History   No obstetric history on file.      Home Medications    Prior to Admission medications   Medication Sig Start Date End Date Taking? Authorizing Provider  albuterol (VENTOLIN HFA) 108 (90 Base) MCG/ACT inhaler Inhale 2 puffs into the lungs every 4 (four) hours as needed. 10/25/20   Yes Becky Augusta, NP  benzonatate (TESSALON) 100 MG capsule Take 2 capsules (200 mg total) by mouth every 8 (eight) hours. 10/25/20  Yes Becky Augusta, NP  promethazine-dextromethorphan (PROMETHAZINE-DM) 6.25-15 MG/5ML syrup Take 5 mLs by mouth 4 (four) times daily as needed. 10/25/20  Yes Becky Augusta, NP  sertraline (ZOLOFT) 100 MG tablet Take 1 tablet (100 mg total) by mouth daily. 08/20/20  Yes Jomarie Longs, MD  Spacer/Aero-Holding Chambers (AEROCHAMBER MV) inhaler Use as instructed 10/25/20  Yes Becky Augusta, NP  amitriptyline (ELAVIL) 50 MG tablet Take 1 tablet (50 mg total) by mouth at bedtime. 03/10/18 03/16/19  Particia Nearing, PA-C  Norgestimate-Ethinyl Estradiol Triphasic (ORTHO TRI-CYCLEN LO) 0.18/0.215/0.25 MG-25 MCG tab Take 1 tablet by mouth daily. Patient not taking: Reported on 02/07/2018 11/10/16 03/16/19  Kerman Passey, MD  prazosin (MINIPRESS) 1 MG capsule Take 1 capsule (1 mg total) by mouth at bedtime. 08/20/20 10/25/20  Jomarie Longs, MD  SUMAtriptan (IMITREX) 50 MG tablet Take 1 tablet (50 mg total) by mouth every 2 (two) hours as needed for migraine. May repeat in 2 hours if headache persists or recurs. 03/10/18 03/16/19  Particia Nearing, PA-C  traZODone (DESYREL) 50 MG tablet Take 1-1.5 tablets (50-75 mg total) by mouth at bedtime as needed for sleep. 03/21/20 10/25/20  Jomarie Longs, MD    Family History Family History  Problem Relation Age of Onset  . Hyperlipidemia Father   . Hypertension Father   .  Diabetes Father   . Heart disease Paternal Grandmother   . Stroke Paternal Grandmother   . Cancer Paternal Grandmother        lung and breast  . Bipolar disorder Paternal Grandmother   . ADD / ADHD Paternal Grandmother   . Cancer Maternal Grandmother        breast  . Bipolar disorder Paternal Aunt   . Drug abuse Other   . COPD Neg Hx     Social History Social History   Tobacco Use  . Smoking status: Never Smoker  . Smokeless tobacco: Never Used  Vaping  Use  . Vaping Use: Never used  Substance Use Topics  . Alcohol use: No    Alcohol/week: 0.0 standard drinks  . Drug use: No     Allergies   Patient has no known allergies.   Review of Systems Review of Systems  Constitutional: Positive for chills. Negative for activity change, appetite change and fever.  HENT: Positive for congestion, rhinorrhea and sore throat. Negative for ear pain.   Respiratory: Positive for cough, shortness of breath and wheezing.   Gastrointestinal: Positive for abdominal pain, diarrhea and nausea. Negative for vomiting.  Musculoskeletal: Positive for back pain and myalgias.  Skin: Negative for rash.  Neurological: Positive for facial asymmetry.  Hematological: Negative.   Psychiatric/Behavioral: Negative.      Physical Exam Triage Vital Signs ED Triage Vitals  Enc Vitals Group     BP 10/25/20 1330 93/77     Pulse Rate 10/25/20 1330 (!) 124     Resp 10/25/20 1330 (!) 28     Temp 10/25/20 1330 99.1 F (37.3 C)     Temp Source 10/25/20 1330 Oral     SpO2 10/25/20 1330 100 %     Weight 10/25/20 1328 166 lb (75.3 kg)     Height 10/25/20 1328 5\' 2"  (1.575 m)     Head Circumference --      Peak Flow --      Pain Score 10/25/20 1328 8     Pain Loc --      Pain Edu? --      Excl. in GC? --    No data found.  Updated Vital Signs BP 93/77 (BP Location: Right Arm)   Pulse (!) 124   Temp 99.1 F (37.3 C) (Oral)   Resp (!) 28   Ht 5\' 2"  (1.575 m)   Wt 166 lb (75.3 kg)   LMP 10/13/2020   SpO2 100%   BMI 30.36 kg/m   Visual Acuity Right Eye Distance:   Left Eye Distance:   Bilateral Distance:    Right Eye Near:   Left Eye Near:    Bilateral Near:     Physical Exam Vitals and nursing note reviewed.  Constitutional:      Appearance: Normal appearance. She is ill-appearing. She is not toxic-appearing.  HENT:     Head: Normocephalic and atraumatic.     Right Ear: Tympanic membrane, ear canal and external ear normal. There is no impacted  cerumen.     Left Ear: There is impacted cerumen.     Nose: Congestion and rhinorrhea present.     Mouth/Throat:     Mouth: Mucous membranes are moist.     Pharynx: Oropharynx is clear. No posterior oropharyngeal erythema.  Cardiovascular:     Rate and Rhythm: Normal rate and regular rhythm.     Pulses: Normal pulses.     Heart sounds: Normal heart sounds. No murmur  heard. No gallop.   Pulmonary:     Effort: Respiratory distress present.     Breath sounds: Wheezing present. No rhonchi or rales.  Musculoskeletal:     Cervical back: Normal range of motion and neck supple.  Lymphadenopathy:     Cervical: No cervical adenopathy.  Skin:    General: Skin is warm and dry.     Capillary Refill: Capillary refill takes less than 2 seconds.     Findings: No erythema.  Neurological:     General: No focal deficit present.     Mental Status: She is alert and oriented to person, place, and time.  Psychiatric:        Mood and Affect: Mood normal.        Behavior: Behavior normal.        Thought Content: Thought content normal.        Judgment: Judgment normal.      UC Treatments / Results  Labs (all labs ordered are listed, but only abnormal results are displayed) Labs Reviewed  RESP PANEL BY RT-PCR (FLU A&B, COVID) ARPGX2 - Abnormal; Notable for the following components:      Result Value   SARS Coronavirus 2 by RT PCR POSITIVE (*)    All other components within normal limits    EKG   Radiology No results found.  Procedures Procedures (including critical care time)  Medications Ordered in UC Medications - No data to display  Initial Impression / Assessment and Plan / UC Course  I have reviewed the triage vital signs and the nursing notes.  Pertinent labs & imaging results that were available during my care of the patient were reviewed by me and considered in my medical decision making (see chart for details).   Patient is an ill-appearing 25 year old female here for  evaluation of cold symptoms that began 2 days ago and include headache, body aches, chills, nasal congestion with green nasal discharge, nonproductive cough, scratchy throat, shortness of breath and wheezing, abdominal pain, nausea, and diarrhea.  Patient denies fever, ear pain or vomiting.  Patient's physical exam reveals impacted cerumen on the left obscuring visualization of the tympanic membrane.  Right tympanic membrane is pearly gray with a normal light reflex and clear external auditory canal.  Nasal mucosa is erythematous and edematous with clear nasal discharge.  Oropharyngeal exam reveals posterior oropharyngeal erythema with clear postnasal drip.  No cervical lymphadenopathy appreciated exam.  Patient is tachypneic with respiratory rate of 28 and has some audible wheezing.  Heart sounds are normal but lung sounds include scattered wheezing in all fields.  Respiratory triplex panel collected at triage.  Will obtain chest x-ray to look for acute intrathoracic process.  Chest x-ray reviewed and independently evaluated by me.  Interpretation: No evidence of pneumonia or pneumothorax.  No presence of effusion.  Awaiting radiology overread.  Respiratory triplex panel is positive for COVID but negative for flu.  We will discharge patient home with albuterol MDI and spacer to help with shortness of breath and wheezing, Tessalon Perles and Promethazine DM cough syrup to help with cough and congestion, however use over-the-counter Tylenol and ibuprofen as needed for body aches, ER precautions reviewed with patient.   Final Clinical Impressions(s) / UC Diagnoses   Final diagnoses:  COVID-19     Discharge Instructions     Your test today shows that you are positive for COVID-19.  You need to quarantine at home for 5 days from when your symptoms started.  After the  5 days you can break quarantine if your symptoms have improved and you have not had a fever for 24 hours without taking Tylenol or  ibuprofen.  Use Tylenol or ibuprofen according to the package instructions as needed for body aches, headache, and fever.  Use the albuterol inhaler with spacer, 2 puffs every 4-6 hours, as needed for shortness of breath and wheezing.  Use the Tessalon Perles during the day as needed for cough and use the Promethazine DM cough syrup at nighttime as needed for cough and congestion.  The cough syrup will make you drowsy.  If you have any worsening shortness of breath, especially at rest, you feel that you cannot catch her breath, you cannot speak in full sentences, or as a late sign your lips start to turn blue you need to go to the ER for evaluation.    ED Prescriptions    Medication Sig Dispense Auth. Provider   albuterol (VENTOLIN HFA) 108 (90 Base) MCG/ACT inhaler Inhale 2 puffs into the lungs every 4 (four) hours as needed. 18 g Becky Augustayan, Caro Brundidge, NP   Spacer/Aero-Holding Chambers (AEROCHAMBER MV) inhaler Use as instructed 1 each Becky Augustayan, Willamae Demby, NP   benzonatate (TESSALON) 100 MG capsule Take 2 capsules (200 mg total) by mouth every 8 (eight) hours. 21 capsule Becky Augustayan, Cort Dragoo, NP   promethazine-dextromethorphan (PROMETHAZINE-DM) 6.25-15 MG/5ML syrup Take 5 mLs by mouth 4 (four) times daily as needed. 118 mL Becky Augustayan, Carlicia Leavens, NP     PDMP not reviewed this encounter.   Becky Augustayan, Johnjoseph Rolfe, NP 10/25/20 1435

## 2020-10-25 NOTE — ED Triage Notes (Signed)
Patient complains of body aches, chills, cough and fatigue x 2 days.

## 2020-10-30 ENCOUNTER — Other Ambulatory Visit: Payer: Self-pay

## 2020-10-30 ENCOUNTER — Emergency Department: Payer: BC Managed Care – PPO

## 2020-10-30 ENCOUNTER — Emergency Department
Admission: EM | Admit: 2020-10-30 | Discharge: 2020-10-30 | Disposition: A | Discharge status: Home / Self Care | Payer: BC Managed Care – PPO | Attending: Emergency Medicine | Admitting: Emergency Medicine

## 2020-10-30 DIAGNOSIS — R55 Syncope and collapse: Secondary | ICD-10-CM | POA: Insufficient documentation

## 2020-10-30 DIAGNOSIS — E860 Dehydration: Secondary | ICD-10-CM | POA: Insufficient documentation

## 2020-10-30 DIAGNOSIS — U071 COVID-19: Secondary | ICD-10-CM | POA: Insufficient documentation

## 2020-10-30 DIAGNOSIS — M791 Myalgia, unspecified site: Secondary | ICD-10-CM | POA: Insufficient documentation

## 2020-10-30 DIAGNOSIS — R531 Weakness: Secondary | ICD-10-CM | POA: Diagnosis not present

## 2020-10-30 DIAGNOSIS — R110 Nausea: Secondary | ICD-10-CM | POA: Diagnosis not present

## 2020-10-30 LAB — CBC
HCT: 38.1 % (ref 36.0–46.0)
Hemoglobin: 12.3 g/dL (ref 12.0–15.0)
MCH: 25.8 pg — ABNORMAL LOW (ref 26.0–34.0)
MCHC: 32.3 g/dL (ref 30.0–36.0)
MCV: 79.9 fL — ABNORMAL LOW (ref 80.0–100.0)
Platelets: 354 10*3/uL (ref 150–400)
RBC: 4.77 MIL/uL (ref 3.87–5.11)
RDW: 13.6 % (ref 11.5–15.5)
WBC: 9.9 10*3/uL (ref 4.0–10.5)
nRBC: 0 % (ref 0.0–0.2)

## 2020-10-30 LAB — BASIC METABOLIC PANEL
Anion gap: 9 (ref 5–15)
BUN: 16 mg/dL (ref 6–20)
CO2: 23 mmol/L (ref 22–32)
Calcium: 9.2 mg/dL (ref 8.9–10.3)
Chloride: 106 mmol/L (ref 98–111)
Creatinine, Ser: 0.74 mg/dL (ref 0.44–1.00)
GFR, Estimated: >60 mL/min (ref >60)
Glucose, Bld: 111 mg/dL — ABNORMAL HIGH (ref 70–99)
Potassium: 4.1 mmol/L (ref 3.5–5.1)
Sodium: 138 mmol/L (ref 135–145)

## 2020-10-30 LAB — D-DIMER, QUANTITATIVE: D-Dimer, Quant: 0.29 ug/mL-FEU (ref 0.00–0.50)

## 2020-10-30 NOTE — ED Provider Notes (Signed)
[facility] Provider Note   ____________________________________________   Event Date/Time   First MD Initiated Contact with Patient 10/30/20 1044     (approximate)  I have reviewed the triage vital signs and the nursing notes.   HISTORY  Chief Complaint Near Syncope    HPI Misty Peters is a 25 y.o. 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  Patient states that she felt flushed and lightheaded and had to sit down to keep from passing out.  Patient states that the symptoms have occurred previously over the past few days but have never been this severe.  Patient endorses associated generalized weakness, nausea, and intermittent muscle aches.  Patient currently denies any vision changes, tinnitus, difficulty speaking, facial droop, sore throat, chest pain, shortness of breath, abdominal pain, nausea/vomiting/diarrhea, dysuria, or numbness/paresthesias in any extremity         Past Medical History:  Diagnosis Date  . Anxiety   . Bipolar disorder with depression (HCC) 11/07/2015  . Depression   . Headache     Patient Active Problem List   Diagnosis Date Noted  . Bipolar disorder, in full remission, most recent episode depressed (HCC) 08/07/2020  . Bipolar disorder, current episode mixed, moderate (HCC) 12/07/2019  . High risk medication use 11/17/2019  . MDD (major depressive disorder) 08/13/2019  . Bipolar I disorder, most recent episode depressed (HCC) 08/13/2019  . PTSD (post-traumatic stress disorder) 08/13/2019  . Suicide attempt (HCC)   . Migraine 03/13/2018  . Suicidal ideations 02/08/2018  . Encounter for BCP (birth control pills) initial prescription 11/15/2016  . Bipolar affective disorder, depressed, severe (HCC)   . Anxiety     Past Surgical  History:  Procedure Laterality Date  . GUM SURGERY    . SALIVARY STONE REMOVAL    . WISDOM TOOTH EXTRACTION      Prior to Admission medications   Medication Sig Start Date End Date Taking? Authorizing Provider  albuterol (VENTOLIN HFA) 108 (90 Base) MCG/ACT inhaler Inhale 2 puffs into the lungs every 4 (four) hours as needed. 10/25/20  Yes Becky Augusta, NP  benzonatate (TESSALON) 100 MG capsule Take 2 capsules (200 mg total) by mouth every 8 (eight) hours. 10/25/20  Yes Becky Augusta, NP  promethazine-dextromethorphan (PROMETHAZINE-DM) 6.25-15 MG/5ML syrup Take 5 mLs by mouth 4 (four) times daily as needed. Patient taking differently: Take 5 mLs by mouth 4 (four) times daily as needed. 10/25/20  Yes Becky Augusta, NP  sertraline (ZOLOFT) 100 MG tablet Take 1 tablet (100 mg total) by mouth daily. 08/20/20  Yes Jomarie Longs, MD  Spacer/Aero-Holding Chambers (AEROCHAMBER MV) inhaler Use as instructed 10/25/20   Becky Augusta, NP  amitriptyline (ELAVIL) 50 MG tablet Take 1 tablet (50 mg total) by mouth at bedtime. 03/10/18 03/16/19  Particia Nearing, PA-C  Norgestimate-Ethinyl Estradiol Triphasic (ORTHO TRI-CYCLEN LO) 0.18/0.215/0.25 MG-25 MCG tab Take 1 tablet by mouth daily. Patient not taking: Reported on 02/07/2018 11/10/16 03/16/19  Kerman Passey, MD  prazosin (MINIPRESS) 1 MG capsule Take 1 capsule (1 mg total) by mouth at bedtime. 08/20/20 10/25/20  Jomarie Longs, MD  SUMAtriptan (IMITREX) 50 MG tablet Take 1 tablet (50 mg total) by mouth every 2 (two) hours as needed for migraine. May repeat in 2 hours if headache persists or recurs. 03/10/18 03/16/19  Particia Nearing, PA-C  traZODone (DESYREL) 50 MG tablet Take 1-1.5 tablets (50-75 mg total) by mouth at bedtime as needed for sleep. 03/21/20 10/25/20  Jomarie Longs, MD    Allergies Patient has no known allergies.  Family History  Problem Relation Age of Onset  . Hyperlipidemia Father   . Hypertension Father   . Diabetes Father   . Heart  disease Paternal Grandmother   . Stroke Paternal Grandmother   . Cancer Paternal Grandmother        lung and breast  . Bipolar disorder Paternal Grandmother   . ADD / ADHD Paternal Grandmother   . Cancer Maternal Grandmother        breast  . Bipolar disorder Paternal Aunt   . Drug abuse Other   . COPD Neg Hx     Social History Social History   Tobacco Use  . Smoking status: Never Smoker  . Smokeless tobacco: Never Used  Vaping Use  . Vaping Use: Never used  Substance Use Topics  . Alcohol use: No    Alcohol/week: 0.0 standard drinks  . Drug use: No    Review of Systems Constitutional: No fever/chills Eyes: No visual changes. ENT: No sore throat. Cardiovascular: Denies chest pain. Respiratory: Denies shortness of breath. Gastrointestinal: No abdominal pain.  No nausea, no vomiting.  No diarrhea. Genitourinary: Negative for dysuria. Musculoskeletal: Negative for acute arthralgias Skin: Negative for rash. Neurological: Negative for headaches, numbness/paresthesias in any extremity Psychiatric: Negative for suicidal ideation/homicidal ideation   ____________________________________________   PHYSICAL EXAM:  VITAL SIGNS: ED Triage Vitals  Enc Vitals Group     BP 10/30/20 1018 116/86     Pulse Rate 10/30/20 1018 76     Resp 10/30/20 1018 20     Temp 10/30/20 1018 98 F (36.7 C)     Temp Source 10/30/20 1018 Oral     SpO2 10/30/20 1018 96 %     Weight 10/30/20 1019 165 lb 5.5 oz (75 kg)     Height 10/30/20 1019 5\' 2"  (1.575 m)     Head Circumference --      Peak Flow --      Pain Score 10/30/20 1019 0     Pain Loc --      Pain Edu? --      Excl. in GC? --    Constitutional: Alert and oriented. Well appearing and in no acute distress. Eyes: Conjunctivae are normal. PERRL. Head: Atraumatic. Nose: No congestion/rhinnorhea. Mouth/Throat: Mucous membranes are moist. Neck: No stridor Cardiovascular: Grossly normal heart sounds.  Good peripheral  circulation. Respiratory: Normal respiratory effort.  No retractions. Gastrointestinal: Soft and nontender. No distention. Musculoskeletal: No obvious deformities Neurologic:  Normal speech and language. No gross focal neurologic deficits are appreciated. Skin:  Skin is warm and dry. No rash noted. Psychiatric: Mood and affect are normal. Speech and behavior are normal.  ____________________________________________   LABS (all labs ordered are listed, but only abnormal results are displayed)  Labs Reviewed  BASIC METABOLIC PANEL - Abnormal; Notable for the following components:      Result Value   Glucose, Bld 111 (*)    All other components within normal limits  CBC - Abnormal; Notable for the following components:   MCV 79.9 (*)    MCH 25.8 (*)    All other components within normal limits  D-DIMER, QUANTITATIVE  URINALYSIS, COMPLETE (UACMP) WITH MICROSCOPIC  CBG MONITORING, ED  POC URINE PREG, ED   ____________________________________________  EKG  ED ECG REPORT I, 12/30/20  Maxie Slovacek, the attending physician, personally viewed and interpreted this ECG.  Date: 10/30/2020 EKG Time: 1011 Rate: 74 Rhythm: normal sinus rhythm QRS Axis: normal Intervals: normal ST/T Wave abnormalities: normal Narrative Interpretation: no evidence of acute ischemia  ____________________________________________  RADIOLOGY  ED MD interpretation: One-view portable chest x-ray shows no evidence of acute abnormalities including no pneumonia, pneumothorax, or widened mediastinum  Official radiology report(s): DG Chest Port 1 View  Result Date: 10/30/2020 CLINICAL DATA:  Chest pain.  Near syncope. EXAM: PORTABLE CHEST 1 VIEW COMPARISON:  10/25/2020 FINDINGS: Heart and mediastinal contours are within normal limits. No focal opacities or effusions. No acute bony abnormality. IMPRESSION: No active disease. Electronically Signed   By: Charlett Nose M.D.   On: 10/30/2020 11:38     ____________________________________________   PROCEDURES  Procedure(s) performed (including Critical Care):  .1-3 Lead EKG Interpretation Performed by: Merwyn Katos, MD Authorized by: Merwyn Katos, MD     Interpretation: normal     ECG rate:  81   ECG rate assessment: normal     Rhythm: sinus rhythm     Ectopy: none     Conduction: normal       ____________________________________________   INITIAL IMPRESSION / ASSESSMENT AND PLAN / ED COURSE  As part of my medical decision making, I reviewed the following data within the electronic MEDICAL RECORD NUMBER Nursing notes reviewed and incorporated, Labs reviewed, EKG interpreted, Old chart reviewed, Radiograph reviewed and Notes from prior ED visits reviewed and incorporated        Patient presents with complaints of presyncope ED Workup:  CBC, BMP, Troponin, BNP, ECG, CXR Differential diagnosis includes HF, ICH, seizure, stroke, HOCM, ACS, aortic dissection, malignant arrhythmia, or GI bleed. Findings: No evidence of acute laboratory abnormalities.  Troponin negative x1 EKG: No e/o STEMI. No evidence of Brugadas sign, delta wave, epsilon wave, significantly prolonged QTc, or malignant arrhythmia.  Disposition: Discharge. Patient is at baseline at this time. Return precautions expressed and understood in person. Advised follow up with primary care provider or clinic physician in next 24 hours.      ____________________________________________   FINAL CLINICAL IMPRESSION(S) / ED DIAGNOSES  Final diagnoses:  Near syncope  Weakness  Dehydration     ED Discharge Orders    None       Note:  This document was prepared using Dragon voice recognition software and may include unintentional dictation errors.   Merwyn Katos, MD 10/30/20 1530

## 2020-10-30 NOTE — ED Triage Notes (Signed)
Pt coming from work with a near syncopal episode, pt also stated she had some chest pain  which has subsided.   Pt was also covid positive aprox 1 week ago .

## 2020-10-30 NOTE — ED Notes (Signed)
Discharge instructions reviewed with pt. Pt calm , collective , ambulatory upon discharge.  

## 2020-11-08 ENCOUNTER — Other Ambulatory Visit: Payer: Self-pay

## 2020-11-08 ENCOUNTER — Telehealth (INDEPENDENT_AMBULATORY_CARE_PROVIDER_SITE_OTHER): Payer: Self-pay | Admitting: Nurse Practitioner

## 2020-11-08 ENCOUNTER — Encounter: Payer: Self-pay | Admitting: Nurse Practitioner

## 2020-11-08 ENCOUNTER — Ambulatory Visit: Payer: Self-pay

## 2020-11-08 VITALS — Temp 98.7°F

## 2020-11-08 DIAGNOSIS — R0602 Shortness of breath: Secondary | ICD-10-CM

## 2020-11-08 DIAGNOSIS — U071 COVID-19: Secondary | ICD-10-CM

## 2020-11-08 MED ORDER — METHYLPREDNISOLONE 4 MG PO TBPK
ORAL_TABLET | ORAL | 0 refills | Status: DC
Start: 2020-11-08 — End: 2021-01-13

## 2020-11-08 NOTE — Telephone Encounter (Signed)
Pt. Reports diagnosed with COVID 10/25/20. Still coughing,mild shortness of breath, chest tightness, fatigue.Spoke with Avimor in the practice. Virtual visit for today. Reason for Disposition . [1] MILD difficulty breathing (e.g., minimal/no SOB at rest, SOB with walking, pulse <100) AND [2] new-onset  Answer Assessment - Initial Assessment Questions 1. COVID-19 ONSET: "When did the symptoms of COVID-19 first start?"     10/24/20 2. DIAGNOSIS CONFIRMATION: "How were you diagnosed?" (e.g., COVID-19 oral or nasal viral test; COVID-19 antibody test; doctor visit)     PCR 3. MAIN SYMPTOM:  "What is your main concern or symptom right now?" (e.g., breathing difficulty, cough, fatigue. loss of smell)     Cough,chest pain,short of breath 4. SYMPTOM ONSET: "When did the  cough  start?"     10/24/20 5. BETTER-SAME-WORSE: "Are you getting better, staying the same, or getting worse over the last 1 to 2 weeks?"     Same 6. RECENT MEDICAL VISIT: "Have you been seen by a healthcare provider (doctor, NP, PA) for these persisting COVID-19 symptoms?" If Yes, ask: "When were you seen?" (e.g., date)     No 7. COUGH: "Do you have a cough?" If Yes, ask: "How bad is the cough?"       Yes 8. FEVER: "Do you have a fever?" If Yes, ask: "What is your temperature, how was it measured, and when did it start?"     No 9. BREATHING DIFFICULTY: "Are you having any trouble breathing?" If Yes, ask: "How bad is your breathing?" (e.g., mild, moderate, severe)    - MILD: No SOB at rest, mild SOB with walking, speaks normally in sentences, can lie down, no retractions, pulse < 100.    - MODERATE: SOB at rest, SOB with minimal exertion and prefers to sit, cannot lie down flat, speaks in phrases, mild retractions, audible wheezing, pulse 100-120.    - SEVERE: Very SOB at rest, speaks in single words, struggling to breathe, sitting hunched forward, retractions, pulse > 120       Mild 10. HIGH RISK DISEASE: "Do you have any chronic  medical problems?" (e.g., asthma, heart or lung disease, weak immune system, obesity, etc.)       No 11. VACCINE: "Have you gotten the COVID-19 vaccine?" If Yes, ask: "Which one, how many shots, when did you get it?"       Yes -Pfizer 12. BOOSTER: "Have you received your COVID-19 booster?" If Yes, ask: "Which one and when did you get it?"       Yes 13. PREGNANCY: "Is there any chance you are pregnant?" "When was your last menstrual period?"       No 14. OTHER SYMPTOMS: "Do you have any other symptoms?"  (e.g., fatigue, headache, muscle pain, weakness)       Fatigue 15. O2 SATURATION MONITOR:  "Do you use an oxygen saturation monitor (pulse oximeter) at home?" If Yes, ask "What is your reading (oxygen level) today?" "What is your usual oxygen saturation reading?" (e.g., 95%)       No  Protocols used: CORONAVIRUS (COVID-19) PERSISTING SYMPTOMS FOLLOW-UP CALL-A-AH

## 2020-11-08 NOTE — Progress Notes (Signed)
Temp 98.7 F (37.1 C)   LMP 10/13/2020    Subjective:    Patient ID: Misty Peters, female    DOB: Jan 04, 1996, 25 y.o.   MRN: [id number]  HPI: Misty Peters is a 25 y.o. female  Chief Complaint  Patient presents with  . Covid Positive    Two weeks ago, now having difficulty breathing, chest pain and dizziness    UPPER RESPIRATORY TRACT INFECTION  Worst symptom: COVID Positive two weeks ago.  Now having dizziness. Fever: no Cough: no Shortness of breath: yes Wheezing: yes Chest pain: yes Chest tightness: yes Chest congestion: no Nasal congestion: no Runny nose: no Post nasal drip: no Sneezing: no Sore throat: no Swollen glands: no Sinus pressure: no Headache: yes Face pain: no Toothache: no Ear pain: no bilateral Ear pressure: no bilateral Eyes red/itching:no Eye drainage/crusting: no  Vomiting: no Rash: no Fatigue: yes Sick contacts: no Strep contacts: no  Context: stable Recurrent sinusitis: no Relief with OTC cold/cough medications: no  Treatments attempted: allergy medication   Relevant past medical, surgical, family and social history reviewed and updated as indicated. Interim medical history since our last visit reviewed. Allergies and medications reviewed and updated.  Review of Systems  Constitutional: Positive for fatigue. Negative for fever.  HENT: Negative for congestion, dental problem, ear pain, postnasal drip, rhinorrhea, sinus pressure, sinus pain, sneezing and sore throat.   Respiratory: Positive for shortness of breath and wheezing. Negative for cough.   Cardiovascular: Negative for chest pain.  Gastrointestinal: Negative for vomiting.  Skin: Negative for rash.  Neurological: Positive for dizziness and headaches.    Per HPI unless specifically indicated above     Objective:    Temp 98.7 F (37.1 C)   LMP 10/13/2020   Wt Readings from Last 3 Encounters:  10/30/20 165 lb 5.5 oz (75 kg)  10/25/20 166 lb (75.3 kg)   12/14/19 159 lb (72.1 kg)    Physical Exam Vitals and nursing note reviewed.  Pulmonary:     Effort: Pulmonary effort is normal. No respiratory distress.  Neurological:     Mental Status: She is alert.  Psychiatric:        Mood and Affect: Mood normal.        Behavior: Behavior normal.        Thought Content: Thought content normal.        Judgment: Judgment normal.     Results for orders placed or performed during the hospital encounter of 10/30/20  Basic metabolic panel  Result Value Ref Range   Sodium 138 135 - 145 mmol/L   Potassium 4.1 3.5 - 5.1 mmol/L   Chloride 106 98 - 111 mmol/L   CO2 23 22 - 32 mmol/L   Glucose, Bld 111 (H) 70 - 99 mg/dL   BUN 16 6 - 20 mg/dL   Creatinine, Ser 4.09 0.44 - 1.00 mg/dL   Calcium 9.2 8.9 - 81.1 mg/dL   GFR, Estimated >91 >47 mL/min   Anion gap 9 5 - 15  CBC  Result Value Ref Range   WBC 9.9 4.0 - 10.5 K/uL   RBC 4.77 3.87 - 5.11 MIL/uL   Hemoglobin 12.3 12.0 - 15.0 g/dL   HCT 82.9 56.2 - 13.0 %   MCV 79.9 (L) 80.0 - 100.0 fL   MCH 25.8 (L) 26.0 - 34.0 pg   MCHC 32.3 30.0 - 36.0 g/dL   RDW 86.5 78.4 - 69.6 %   Platelets 354 150 - 400 K/uL  nRBC 0.0 0.0 - 0.2 %  D-dimer, quantitative  Result Value Ref Range   D-Dimer, Quant 0.29 0.00 - 0.50 ug/mL-FEU      Assessment & Plan:   Problem List Items Addressed This Visit   None   Visit Diagnoses    COVID-19    -  Primary   Complete steroids. Can use Albuterol PRN. Obtain chest xray. Will make recommendations based on imaging. If symptoms persist will refer to long haul clinic.   Relevant Medications   methylPREDNISolone (MEDROL DOSEPAK) 4 MG TBPK tablet   Shortness of breath       Relevant Medications   methylPREDNISolone (MEDROL DOSEPAK) 4 MG TBPK tablet   Other Relevant Orders   DG Chest 2 View       Follow up plan: No follow-ups on file.   This visit was completed via MyChart due to the restrictions of the COVID-19 pandemic. All issues as above were discussed  and addressed. Physical exam was done as above through visual confirmation on MyChart. If it was felt that the patient should be evaluated in the office, they were directed there. The patient verbally consented to this visit. 1. Location of the patient: Home 2. Location of the provider: Office 3. Those involved with this call:  ? Provider: Larae Grooms, NP ? CMA: Tiffany Reel, CMA ? Front Desk/Registration: Beverely Pace 4. Time spent on call: 15 minutes with patient face to face via video conference. More than 50% of this time was spent in counseling and coordination of care. 21 minutes total spent in review of patient's record and preparation of their chart.

## 2021-01-01 ENCOUNTER — Telehealth: Payer: Self-pay

## 2021-01-01 NOTE — Telephone Encounter (Signed)
Copied from CRM [id number]. Topic: General - Inquiry >> Dec 30, 2020  2:11 PM Aretta Nip wrote: Reason for CRM: pt is about 10 days from her next period and wants to know if she can come in to see if pregnant before missing a cycle. FU at [phone]

## 2021-01-02 NOTE — Telephone Encounter (Signed)
Please schedule patient for an appointment.

## 2021-01-02 NOTE — Telephone Encounter (Signed)
Pt has apt on 01/13/2021 

## 2021-01-02 NOTE — Telephone Encounter (Signed)
It can be picked up 1 week prior to missed period. However, if it isn't positive it doesn't mean she isn't pregnant it is just really early.  She can come in for an appointment next week.

## 2021-01-03 ENCOUNTER — Ambulatory Visit: Payer: Self-pay | Admitting: Nurse Practitioner

## 2021-01-13 ENCOUNTER — Ambulatory Visit: Payer: BC Managed Care – PPO | Admitting: Nurse Practitioner

## 2021-01-13 ENCOUNTER — Encounter: Payer: Self-pay | Admitting: Nurse Practitioner

## 2021-01-13 ENCOUNTER — Other Ambulatory Visit: Payer: Self-pay

## 2021-01-13 VITALS — BP 111/73 | HR 90 | Temp 98.0°F | Wt 170.1 lb

## 2021-01-13 DIAGNOSIS — N926 Irregular menstruation, unspecified: Secondary | ICD-10-CM | POA: Diagnosis not present

## 2021-01-13 LAB — PREGNANCY, URINE: Preg Test, Ur: NEGATIVE

## 2021-01-13 NOTE — Progress Notes (Signed)
BP 111/73   Pulse 90   Temp 98 F (36.7 C)   Wt 170 lb 2 oz (77.2 kg)   SpO2 99%   BMI 31.12 kg/m    Subjective:    Patient ID: Misty Peters, female    DOB: Nov 18, 1995, 25 y.o.   MRN: [id number]  HPI: Misty Peters is a 25 y.o. female  Chief Complaint  Patient presents with   Amenorrhea    First day of last period 12/12/20.    Patient presents to clinic for pregnancy test. States the first day of her lab period was 12/12/20. Would like urine and blood pregnancy test.  States she is having breast tenderness, abdominal bloating and nausea and heartburn. States when she eats anything with bread it is making her vomiting. She is not always regular with her periods. She recently abruptly stopped taking Zoloft.   Relevant past medical, surgical, family and social history reviewed and updated as indicated. Interim medical history since our last visit reviewed. Allergies and medications reviewed and updated.  Review of Systems  Gastrointestinal:  Positive for nausea.  Genitourinary:        Missed period and breast tenderness   Per HPI unless specifically indicated above     Objective:    BP 111/73   Pulse 90   Temp 98 F (36.7 C)   Wt 170 lb 2 oz (77.2 kg)   SpO2 99%   BMI 31.12 kg/m   Wt Readings from Last 3 Encounters:  01/13/21 170 lb 2 oz (77.2 kg)  10/30/20 165 lb 5.5 oz (75 kg)  10/25/20 166 lb (75.3 kg)    Physical Exam Vitals and nursing note reviewed.  Constitutional:      General: She is not in acute distress.    Appearance: Normal appearance. She is normal weight. She is not ill-appearing, toxic-appearing or diaphoretic.  HENT:     Head: Normocephalic.     Right Ear: External ear normal.     Left Ear: External ear normal.     Nose: Nose normal.     Mouth/Throat:     Mouth: Mucous membranes are moist.     Pharynx: Oropharynx is clear.  Eyes:     General:        Right eye: No discharge.        Left eye: No discharge.     Extraocular  Movements: Extraocular movements intact.     Conjunctiva/sclera: Conjunctivae normal.     Pupils: Pupils are equal, round, and reactive to light.  Cardiovascular:     Rate and Rhythm: Normal rate and regular rhythm.     Heart sounds: No murmur heard. Pulmonary:     Effort: Pulmonary effort is normal. No respiratory distress.     Breath sounds: Normal breath sounds. No wheezing or rales.  Musculoskeletal:     Cervical back: Normal range of motion and neck supple.  Skin:    General: Skin is warm and dry.     Capillary Refill: Capillary refill takes less than 2 seconds.  Neurological:     General: No focal deficit present.     Mental Status: She is alert and oriented to person, place, and time. Mental status is at baseline.  Psychiatric:        Mood and Affect: Mood normal.        Behavior: Behavior normal.        Thought Content: Thought content normal.        Judgment:  Judgment normal.    Results for orders placed or performed during the hospital encounter of 10/30/20  Basic metabolic panel  Result Value Ref Range   Sodium 138 135 - 145 mmol/L   Potassium 4.1 3.5 - 5.1 mmol/L   Chloride 106 98 - 111 mmol/L   CO2 23 22 - 32 mmol/L   Glucose, Bld 111 (H) 70 - 99 mg/dL   BUN 16 6 - 20 mg/dL   Creatinine, Ser 8.78 0.44 - 1.00 mg/dL   Calcium 9.2 8.9 - 67.6 mg/dL   GFR, Estimated >72 >09 mL/min   Anion gap 9 5 - 15  CBC  Result Value Ref Range   WBC 9.9 4.0 - 10.5 K/uL   RBC 4.77 3.87 - 5.11 MIL/uL   Hemoglobin 12.3 12.0 - 15.0 g/dL   HCT 47.0 96.2 - 83.6 %   MCV 79.9 (L) 80.0 - 100.0 fL   MCH 25.8 (L) 26.0 - 34.0 pg   MCHC 32.3 30.0 - 36.0 g/dL   RDW 62.9 47.6 - 54.6 %   Platelets 354 150 - 400 K/uL   nRBC 0.0 0.0 - 0.2 %  D-dimer, quantitative  Result Value Ref Range   D-Dimer, Quant 0.29 0.00 - 0.50 ug/mL-FEU      Assessment & Plan:   Problem List Items Addressed This Visit   None Visit Diagnoses     Missed period    -  Primary   Negative home pregnancy test.  Negative urine test in office. Will draw blood test to evaluate for pregnancy. Will make recommendations based on lab results.   Relevant Orders   Beta hCG quant (ref lab)   Pregnancy, urine        Follow up plan: Return if symptoms worsen or fail to improve.

## 2021-01-14 LAB — BETA HCG QUANT (REF LAB): hCG Quant: <1 m[IU]/mL

## 2021-01-14 NOTE — Progress Notes (Signed)
Hi Misty Peters. Your blood test also indicates that you are not pregnant. Please let me know if you have any questions.

## 2021-03-14 ENCOUNTER — Ambulatory Visit: Payer: Self-pay

## 2021-03-14 NOTE — Telephone Encounter (Signed)
Routing to provider to advise patient.

## 2021-03-14 NOTE — Telephone Encounter (Signed)
Pt states that she received a flu shot on 03/12/21. She states that she has been having spine pain since. Please advise.    Pt. Reports she had a flu shot 03/12/21 and has had upper back and shoulder pain "ever since." Pain is 8/10. Reports also has "horrible sore throat." COVID 19 home test negative. Requests appointment. Spoke with Jon Gills in the practice if appointment should be virtual or in office. Will send triage for disposition. Reason for Disposition  [1] MODERATE back pain (e.g., interferes with normal activities) AND [2] present > 3 days  Answer Assessment - Initial Assessment Questions 1. ONSET: "When did the pain begin?"      03/12/21 2. LOCATION: "Where does it hurt?" (upper, mid or lower back)     Upper back and neck 3. SEVERITY: "How bad is the pain?"  (e.g., Scale 1-10; mild, moderate, or severe)   - MILD (1-3): doesn't interfere with normal activities    - MODERATE (4-7): interferes with normal activities or awakens from sleep    - SEVERE (8-10): excruciating pain, unable to do any normal activities      8 4. PATTERN: "Is the pain constant?" (e.g., yes, no; constant, intermittent)      Constant 5. RADIATION: "Does the pain shoot into your legs or elsewhere?"     No 6. CAUSE:  "What do you think is causing the back pain?"      After flu shot 7. BACK OVERUSE:  "Any recent lifting of heavy objects, strenuous work or exercise?"     No 8. MEDICATIONS: "What have you taken so far for the pain?" (e.g., nothing, acetaminophen, NSAIDS)     Tylenol 9. NEUROLOGIC SYMPTOMS: "Do you have any weakness, numbness, or problems with bowel/bladder control?"     No 10. OTHER SYMPTOMS: "Do you have any other symptoms?" (e.g., fever, abdominal pain, burning with urination, blood in urine)       Sore throat 11. PREGNANCY: "Is there any chance you are pregnant?" (e.g., yes, no; LMP)       No  Protocols used: Back Pain-A-AH

## 2021-03-14 NOTE — Telephone Encounter (Signed)
Patient will need an appointment for evaluation.

## 2021-03-17 NOTE — Telephone Encounter (Signed)
Patient states that back pain is better and if any other complications she will schedule an appointment.

## 2021-03-20 ENCOUNTER — Ambulatory Visit: Payer: BC Managed Care – PPO | Admitting: Nurse Practitioner

## 2021-03-20 ENCOUNTER — Other Ambulatory Visit: Payer: Self-pay

## 2021-03-20 ENCOUNTER — Encounter: Payer: Self-pay | Admitting: Nurse Practitioner

## 2021-03-20 ENCOUNTER — Ambulatory Visit: Payer: Self-pay | Admitting: *Deleted

## 2021-03-20 VITALS — BP 109/69 | HR 108 | Ht 62.0 in | Wt 178.0 lb

## 2021-03-20 DIAGNOSIS — N912 Amenorrhea, unspecified: Secondary | ICD-10-CM

## 2021-03-20 DIAGNOSIS — J069 Acute upper respiratory infection, unspecified: Secondary | ICD-10-CM

## 2021-03-20 LAB — PREGNANCY, URINE: Preg Test, Ur: NEGATIVE

## 2021-03-20 MED ORDER — PREDNISONE 10 MG PO TABS: ORAL_TABLET | ORAL | 0 refills | Status: DC

## 2021-03-20 NOTE — Progress Notes (Signed)
Acute Office Visit  Subjective:    Patient ID: Misty Peters, female    DOB: [birth year], 25 y.o.   MRN: [id number]  Chief Complaint  Patient presents with   Sore Throat   Cough   Nasal Congestion    HPI Patient is in today for got her flu shot on Wednesday 03/12/21. An hour and a half later she started noticing fatigue, nasal congestion and a sore throat. She has had ongoing symptoms and they are not improving. She has taken 2 covid-19 home tests that were negative, one last Thursday and one yesterday. She also notes that she has not had her period since stopping her birth control pill 3 months ago.   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  Strep contacts: no  Context: fluctuating Recurrent sinusitis: no Relief with OTC cold/cough medications: yes  Treatments attempted: dayquil, cough drop     Past Medical History:  Diagnosis Date   Anxiety    Bipolar disorder with depression (HCC) 11/07/2015   Depression    Headache     Past Surgical History:  Procedure Laterality Date   GUM SURGERY     SALIVARY STONE REMOVAL     WISDOM TOOTH EXTRACTION      Family History  Problem Relation Age of Onset   Hyperlipidemia Father    Hypertension Father    Diabetes Father    Heart disease Paternal Grandmother    Stroke Paternal Grandmother    Cancer Paternal Grandmother        lung and breast   Bipolar disorder Paternal Grandmother    ADD / ADHD Paternal Grandmother    Cancer Maternal Grandmother         breast   Bipolar disorder Paternal Aunt    Drug abuse Other    COPD Neg Hx     Social History   Socioeconomic History   Marital status: Single    Spouse name: Not on file   Number of children: 0   Years of education: 13   Highest education level: 10th grade  Occupational History   Not on file  Tobacco Use   Smoking status: Never   Smokeless tobacco: Never  Vaping Use   Vaping Use: Never used  Substance and Sexual Activity   Alcohol use: No    Alcohol/week: 0.0 standard drinks   Drug use: No   Sexual activity: Not Currently  Other Topics Concern   Not on file  Social History Narrative   Born and raised in [place] by both parents   1 older brother   Never married and no kids      Lives with parents and brother in [place]   Social Determinants of Health   Financial Resource Strain: Not on file  Food Insecurity: Not on file  Transportation Needs: Not on file  Physical Activity: Not on file  Stress: Not on file  Social Connections: Not on file  Intimate Partner Violence: Not on file    No outpatient medications prior to visit.   No facility-administered medications  prior to visit.    No Known Allergies  Review of Systems  Constitutional:  Positive for fatigue. Negative for fever.  HENT:  Positive for congestion, postnasal drip, rhinorrhea, sinus pressure and sore throat. Negative for ear pain.   Eyes: Negative.   Respiratory:  Positive for cough. Negative for shortness of breath.   Cardiovascular: Negative.   Gastrointestinal: Negative.   Genitourinary: Negative.   Musculoskeletal:  Positive for myalgias.  Skin: Negative.   Neurological:  Positive for dizziness.      Objective:    Physical Exam Vitals and nursing note reviewed.  Constitutional:      General: She is not in acute distress.    Appearance: Normal appearance.  HENT:     Head: Normocephalic.     Right Ear: Tympanic membrane, ear canal and external ear normal.     Left Ear: Tympanic  membrane, ear canal and external ear normal.     Nose: Congestion present.     Mouth/Throat:     Mouth: Mucous membranes are moist.     Pharynx: Posterior oropharyngeal erythema present.  Eyes:     Conjunctiva/sclera: Conjunctivae normal.  Cardiovascular:     Rate and Rhythm: Normal rate and regular rhythm.     Pulses: Normal pulses.     Heart sounds: Normal heart sounds.  Pulmonary:     Effort: Pulmonary effort is normal.     Breath sounds: Normal breath sounds.  Musculoskeletal:     Cervical back: Normal range of motion.  Skin:    General: Skin is warm.  Neurological:     General: No focal deficit present.     Mental Status: She is alert and oriented to person, place, and time.  Psychiatric:        Mood and Affect: Mood normal.        Behavior: Behavior normal.        Thought Content: Thought content normal.        Judgment: Judgment normal.    BP 109/69   Pulse (!) 108   Ht 5\' 2"  (1.575 m)   Wt 178 lb (80.7 kg)   BMI 32.56 kg/m  Wt Readings from Last 3 Encounters:  03/20/21 178 lb (80.7 kg)  01/13/21 170 lb 2 oz (77.2 kg)  10/30/20 165 lb 5.5 oz (75 kg)    Health Maintenance Due  Topic Date Due   COVID-19 Vaccine (3 - Pfizer risk series) 11/29/2019   INFLUENZA VACCINE  01/20/2021   PAP-Cervical Cytology Screening  02/07/2021   PAP SMEAR-Modifier  02/07/2021    There are no preventive care reminders to display for this patient.   Lab Results  Component Value Date   TSH 2.960 09/20/2019   Lab Results  Component Value Date   WBC 9.9 10/30/2020   HGB 12.3 10/30/2020   HCT 38.1 10/30/2020   MCV 79.9 (L) 10/30/2020   PLT 354 10/30/2020   Lab Results  Component Value Date   NA 138 10/30/2020   K 4.1 10/30/2020   CO2 23 10/30/2020   GLUCOSE 111 (H) 10/30/2020   BUN 16 10/30/2020   CREATININE 0.74 10/30/2020   BILITOT <0.2 09/20/2019   ALKPHOS 78 09/20/2019   AST 28 09/20/2019   ALT 27 09/20/2019   PROT 6.5 09/20/2019   ALBUMIN 4.1 09/20/2019    CALCIUM 9.2 10/30/2020   ANIONGAP 9 10/30/2020   Lab Results  Component Value Date   CHOL 163 09/20/2019   Lab Results  Component Value Date  HDL 41 09/20/2019   Lab Results  Component Value Date   LDLCALC 72 09/20/2019   Lab Results  Component Value Date   TRIG 316 (H) 09/20/2019   Lab Results  Component Value Date   CHOLHDL 3.1 02/07/2018   No results found for: HGBA1C     Assessment & Plan:   Problem List Items Addressed This Visit   None Visit Diagnoses     Upper respiratory tract infection, unspecified type    -  Primary   Most likely viral infection with negative covid-19 tests at home. Start prednisone taper. Encourage fluids, warm salt water gargle, ibuprofen. Cont. OTC meds   Amenorrhea       Has not had menstrual period in 3 months since stopping oral contraceptive. Urine pregnancy test negative today in office.    Relevant Orders   Pregnancy, urine (Completed)        Meds ordered this encounter  Medications   predniSONE (DELTASONE) 10 MG tablet    Sig: Take 6 tablets today, then 5 tablets tomorrow, then decrease by 1 tablet every day until gone    Dispense:  21 tablet    Refill:  0     Gerre Scull, NP

## 2021-03-20 NOTE — Patient Instructions (Signed)
Flonase over the counter once a day Gargle with warm salt water

## 2021-03-20 NOTE — Telephone Encounter (Signed)
Pt reports back pain. See previous encounters. States felt better Monday, "Spine pain, like electricity in back." Reports sore throat, "Like Im swallowing glass. Also reports runny nose, cough, productive for phlegm "With blood but I was yelling at my kids a lot." Reports increased weakness, "Feel like falling out , no energy." Also reports seeing flashes of light and black spots floating this AM, duration 10 minutes, not presently. States did 2 covid tests, last one yesterday, both negative. Requesting appt, advised to schedule if not better.  Pt states she and her dad  in the parking lot of the practice now. Advised she needs appt, cannot walk in. Pt asking if anyone can let her dad in "To go to the bathroom." Advised practice closed for Lunch break "Is anyone in there." Pt does not sound distressed. Assured pt NT would route to practice for PCPs review and final disposition.  Advised may be sent to [facility]/ED . CB# [phone]      Reason for Disposition  [1] SEVERE back pain (e.g., excruciating, unable to do any normal activities) AND [2] not improved 2 hours after pain medicine    Pain intensity varies  Answer Assessment - Initial Assessment Questions 1. ONSET: "When did the pain begin?"      Please see triage summary, multiple issues 2. LOCATION: "Where does it hurt?" (upper, mid or lower back)     Spine 3. SEVERITY: "How bad is the pain?"  (e.g., Scale 1-10; mild, moderate, or severe)   - MILD (1-3): doesn't interfere with normal activities    - MODERATE (4-7): interferes with normal activities or awakens from sleep    - SEVERE (8-10): excruciating pain, unable to do any normal activities      3/10 4. PATTERN: "Is the pain constant?" (e.g., yes, no; constant, intermittent)      shoots 5. RADIATION: "Does the pain shoot into your legs or elsewhere?"     no 6. CAUSE:  "What do you think is causing the back pain?"      Flu shot 7. BACK OVERUSE:  "Any recent lifting of heavy objects,  strenuous work or exercise?"     no 8. MEDICATIONS: "What have you taken so far for the pain?" (e.g., nothing, acetaminophen, NSAIDS)      9. NEUROLOGIC SYMPTOMS: "Do you have any weakness, numbness, or problems with bowel/bladder control?"      10. OTHER SYMPTOMS: "Do you have any other symptoms?" (e.g., fever, abdominal pain, burning with urination, blood in urine)       Multiple 11. PREGNANCY: "Is there any chance you are pregnant?" (e.g., yes, no; LMP)  Protocols used: Back Pain-A-AH

## 2021-03-27 ENCOUNTER — Ambulatory Visit: Payer: Self-pay | Admitting: *Deleted

## 2021-03-27 MED ORDER — AZITHROMYCIN 250 MG PO TABS: ORAL_TABLET | ORAL | 0 refills | Status: AC

## 2021-03-27 NOTE — Telephone Encounter (Signed)
Pt saw Lauren 03/20/21 for symptoms, states "No better at all, feel even worse today." Reports completed course of prednisone 2 days ago, has been using Flonase "And flushed out my ears yesterday."  Reports fatigue, "Can hardly stay awake." Also reports cough is productive, greenish with some flecks of blood, sometimes a dime size clump that is 1/2 red."  Unsure if febrile. States Advil, Nyquil and Dayquil help a little "Comes right back."  Advised ED with hemoptysis, states was a advised to CB if no better after course of medications. "I don't want to wait hours in ED." Assured pt NT would route to practice for PCPs review and final disposition.   Please advise. CB# [phone]

## 2021-03-27 NOTE — Telephone Encounter (Signed)
Summary: Clinical Advice   Patient last seen 03/21/19  by Misty Scull, NP and was advised if medication prescribed did not work to call in.  Patient currently experiencing runny nose, ear and head pain, head ache, fatigue for 2 weeks.       Reason for Disposition  Coughing up rusty-colored (reddish-brown) sputum  Answer Assessment - Initial Assessment Questions 1. ONSET: "When did the cough begin?"       2 weeks ago 2. SEVERITY: "How bad is the cough today?"      "No better" 3. SPUTUM: "Describe the color of your sputum" (none, dry cough; clear, white, yellow, green)     Green 4. HEMOPTYSIS: "Are you coughing up any blood?" If so ask: "How much?" (flecks, streaks, tablespoons, etc.)     Yes, flecks, At times "Dime size clumps" 5. DIFFICULTY BREATHING: "Are you having difficulty breathing?" If Yes, ask: "How bad is it?" (e.g., mild, moderate, severe)    - MILD: No SOB at rest, mild SOB with walking, speaks normally in sentences, can lie down, no retractions, pulse < 100.    - MODERATE: SOB at rest, SOB with minimal exertion and prefers to sit, cannot lie down flat, speaks in phrases, mild retractions, audible wheezing, pulse 100-120.    - SEVERE: Very SOB at rest, speaks in single words, struggling to breathe, sitting hunched forward, retractions, pulse > 120      no 6. FEVER: "Do you have a fever?" If Yes, ask: "What is your temperature, how was it measured, and when did it start?"     Unsure 7. CARDIAC HISTORY: "Do you have any history of heart disease?" (e.g., heart attack, congestive heart failure)       8. LUNG HISTORY: "Do you have any history of lung disease?"  (e.g., pulmonary embolus, asthma, emphysema)      9. PE RISK FACTORS: "Do you have a history of blood clots?" (or: recent major surgery, recent prolonged travel, bedridden)      10. OTHER SYMPTOMS: "Do you have any other symptoms?" (e.g., runny nose, wheezing, chest pain)      Ear aches left >right, extreme fatigue,  headache, neck ache  Protocols used: Cough - Acute Productive-A-AH

## 2021-03-27 NOTE — Telephone Encounter (Signed)
Left message for patient notifying her that her prescription was sent to her local pharmacy per her request. Advised patient to give our office a call back if she has any questions or concerns.

## 2021-03-27 NOTE — Addendum Note (Signed)
Addended by: Larae Grooms on: 03/27/2021 03:18 PM   Modules accepted: Orders

## 2021-03-27 NOTE — Telephone Encounter (Signed)
Please let patient know that I sent her azithromycin to the pharmacy for her.

## 2021-09-18 IMAGING — CR DG CHEST 2V
2 series · 2 of 2 positions shown · non-contrast
Comparison: 09/15/2014

CLINICAL DATA: Cough and shortness of breath.

EXAM:
CHEST - 2 VIEW

[chest pa]
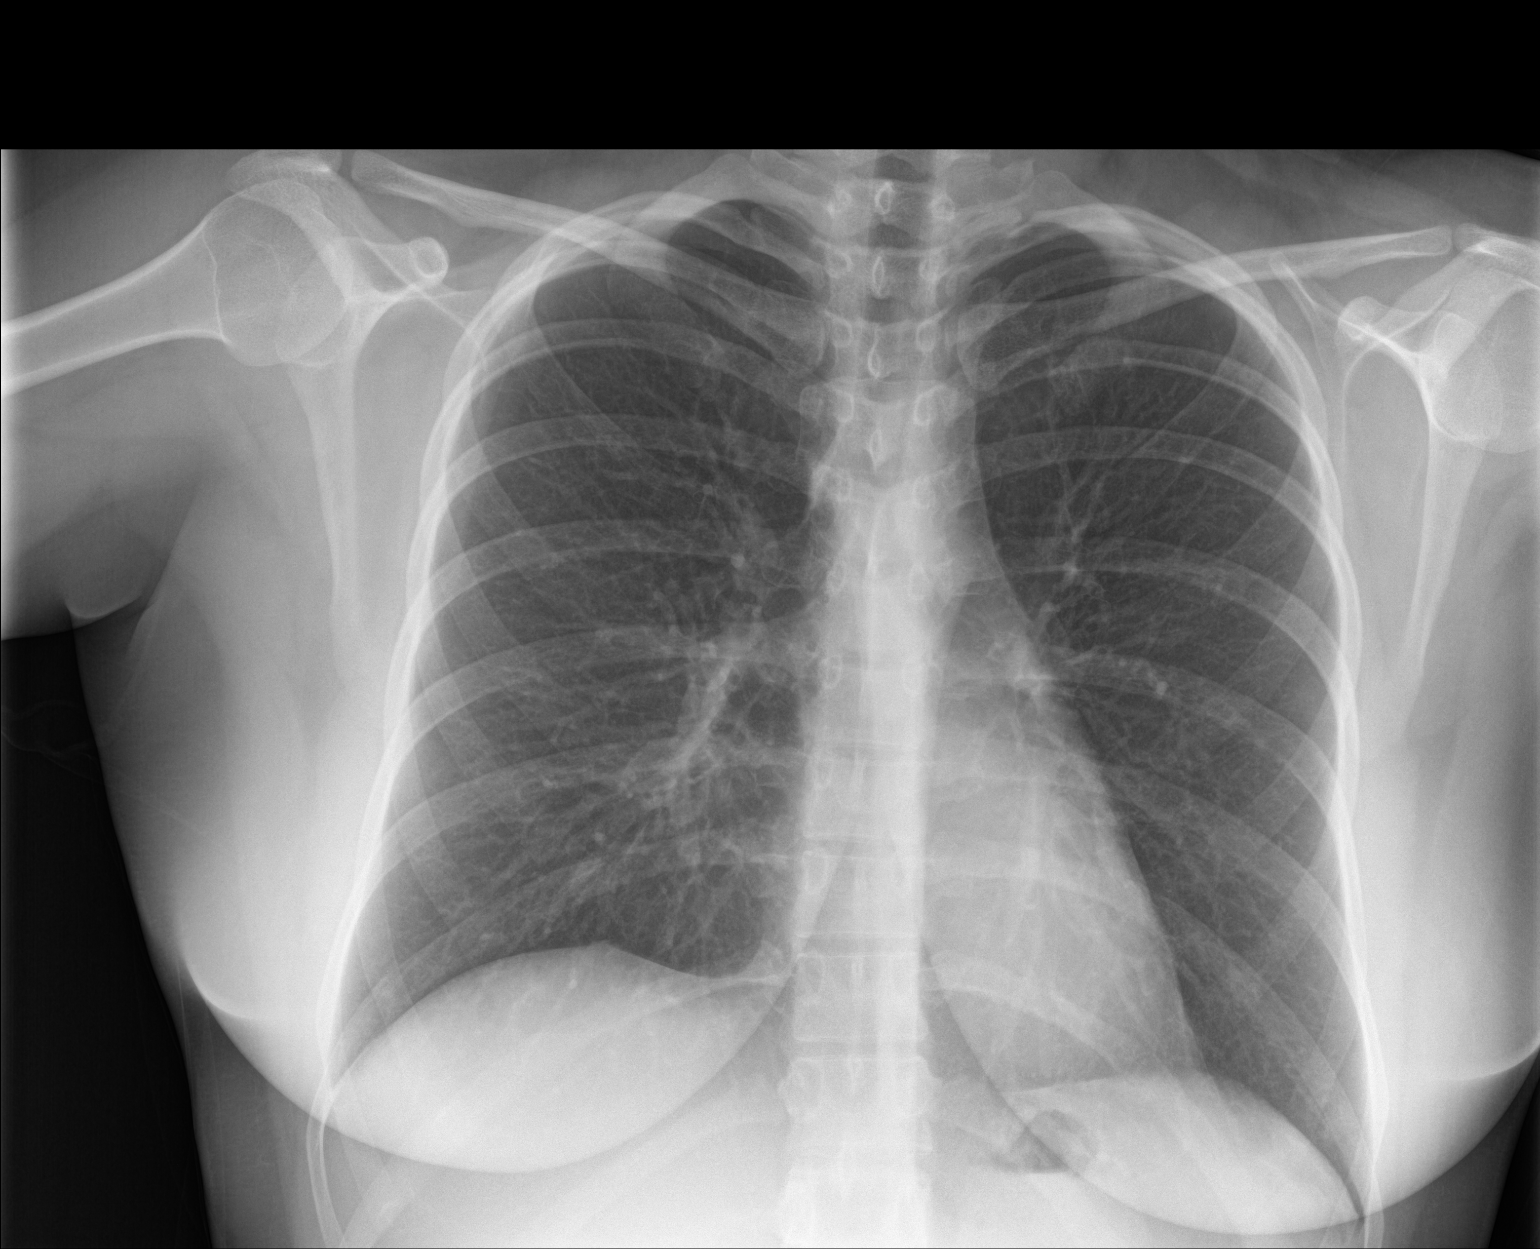

[chest lat]
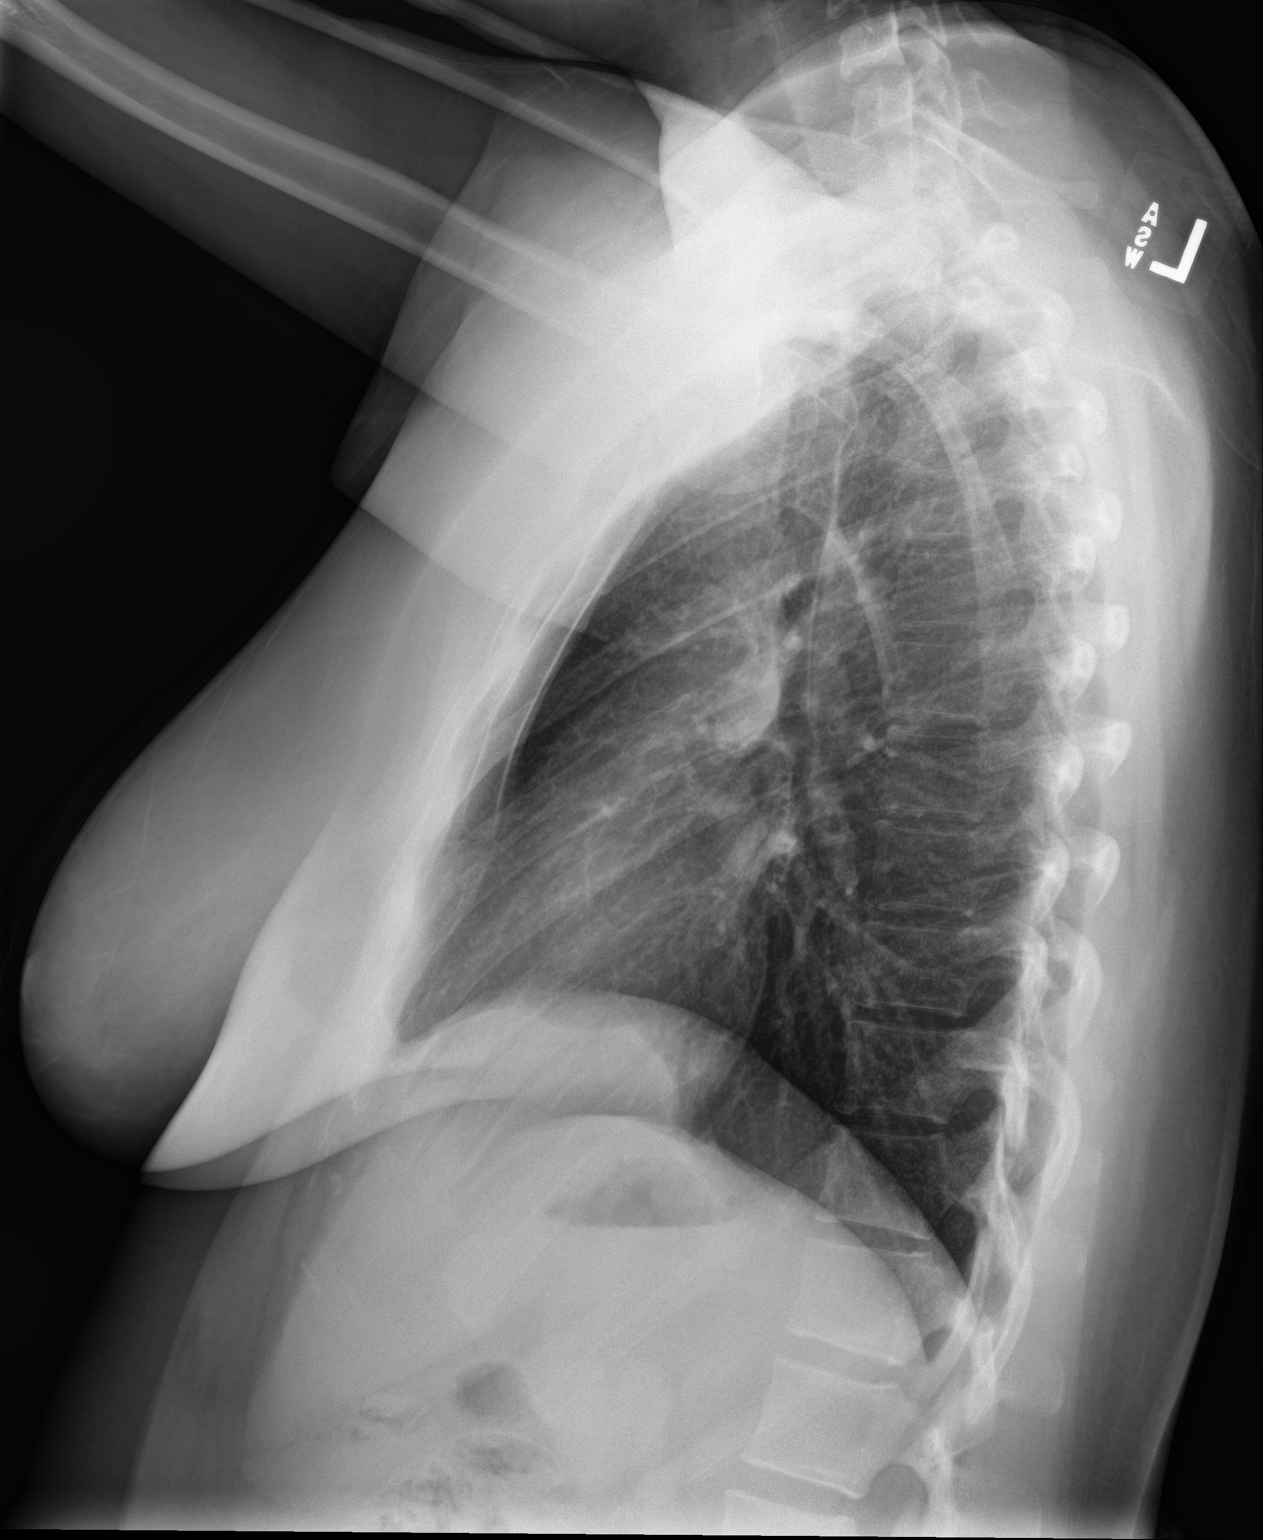

[2 of 2 positions shown; findings below may reference images not displayed]

FINDINGS: The heart size and mediastinal contours are within normal limits.
Both lungs are clear. The visualized skeletal structures are
unremarkable.
IMPRESSION: No active cardiopulmonary disease.

## 2021-12-08 ENCOUNTER — Encounter: Payer: Self-pay | Admitting: Psychiatry

## 2021-12-08 ENCOUNTER — Ambulatory Visit (INDEPENDENT_AMBULATORY_CARE_PROVIDER_SITE_OTHER): Payer: Self-pay | Admitting: Psychiatry

## 2021-12-08 VITALS — BP 109/75 | HR 94 | Temp 98.3°F | Wt 168.0 lb

## 2021-12-08 DIAGNOSIS — F3176 Bipolar disorder, in full remission, most recent episode depressed: Secondary | ICD-10-CM

## 2021-12-08 DIAGNOSIS — F4310 Post-traumatic stress disorder, unspecified: Secondary | ICD-10-CM

## 2021-12-08 NOTE — Progress Notes (Unsigned)
BH MD OP Progress Note  12/08/2021 8:59 AM Misty Peters  MRN:  [id number]  Chief Complaint:  Chief Complaint  Patient presents with   Follow-up: 26 year old Caucasian female, currently employed, single, has a history of bipolar disorder, PTSD, presented for a follow-up appointment.   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  Patient's last appointment with writer was on 08/20/2020.  Patient did not follow through with appointments as discussed at that visit.  Patient however today returns reporting that she would like to close out her treatment since she is planning to join the [employer].  Patient reports she is currently not on any psychotropic medications.  She has not had any significant depression, hypomanic or manic symptoms since her last visit with Clinical research associate.  Patient reports she has been able to maintain her mood, self-regulate better than before.  Patient denies any intrusive memories, nightmares or flashbacks about her history of trauma.  Reports her PTSD symptoms are currently stable.  Patient currently denies any suicidality.  Denies any homicidality or perceptual disturbances.  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  She reports she is currently single and lives with her parents who are supportive.  Patient is interested in joining the [employer] and reports she was advised that she needs to close out all of her treatment before she can join them.   Patient denies any other concerns.  Visit Diagnosis:    ICD-10-CM   1. Bipolar disorder, in full remission, most recent episode depressed (HCC)  F31.76     2. PTSD (post-traumatic stress disorder)  F43.10       Past Psychiatric History: Reviewed past psychiatric history  from progress note on 11/17/2019.  Past trials of Zoloft, trazodone, lithium.  Past Medical History:  Past Medical History:  Diagnosis Date   Anxiety    Bipolar disorder with depression (HCC) 11/07/2015   Depression    Headache     Past Surgical History:  Procedure Laterality Date   GUM SURGERY     SALIVARY STONE REMOVAL     WISDOM TOOTH EXTRACTION      Family Psychiatric History: Reviewed family psychiatric history from progress note on 11/17/2019.  Family History:  Family History  Problem Relation Age of Onset   Hyperlipidemia Father    Hypertension Father    Diabetes Father    Heart disease Paternal Grandmother    Stroke Paternal Grandmother    Cancer Paternal Grandmother        lung and breast   Bipolar disorder Paternal Grandmother    ADD / ADHD Paternal Grandmother    Cancer Maternal Grandmother        breast   Bipolar disorder Paternal Aunt    Drug abuse Other    COPD Neg Hx     Social History: Reviewed social history from progress note on 11/17/2019. Social History   Socioeconomic History   Marital status: Single    Spouse name: Not on file   Number of children: 0   Years of education: 13   Highest education level: 10th grade  Occupational History   Not on file  Tobacco Use   Smoking status: Never   Smokeless tobacco: Never  Vaping Use   Vaping Use: Never used  Substance and Sexual Activity   Alcohol  use: No    Alcohol/week: 0.0 standard drinks of alcohol   Drug use: No   Sexual activity: Not Currently  Other Topics Concern   Not on file  Social History Narrative   Born and raised in [place] by both parents   1 older brother   Never married and no kids      Lives with parents and brother in [place]   Social Determinants of Health   Financial Resource Strain: Not on file  Food Insecurity: Not on file  Transportation Needs: Not on file  Physical Activity: Not on file  Stress: Not on file  Social Connections: Not on file    Allergies: No  Known Allergies  Metabolic Disorder Labs: No results found for: "HGBA1C", "MPG" No results found for: "PROLACTIN" Lab Results  Component Value Date   CHOL 163 09/20/2019   TRIG 316 (H) 09/20/2019   HDL 41 09/20/2019   CHOLHDL 3.1 02/07/2018   LDLCALC 72 09/20/2019   LDLCALC 83 02/07/2018   Lab Results  Component Value Date   TSH 2.960 09/20/2019   TSH 2.32 02/07/2018    Therapeutic Level Labs: No results found for: "LITHIUM" No results found for: "VALPROATE" No results found for: "CBMZ"  Current Medications: No current outpatient medications on file.   No current facility-administered medications for this visit.     Musculoskeletal: Strength & Muscle Tone: within normal limits Gait & Station: normal Patient leans: N/A  Psychiatric Specialty Exam: Review of Systems  Psychiatric/Behavioral: Negative.    All other systems reviewed and are negative.   Blood pressure 109/75, pulse 94, temperature 98.3 F (36.8 C), temperature source Temporal, weight 168 lb (76.2 kg).Body mass index is 30.73 kg/m.  General Appearance: Casual  Eye Contact:  Fair  Speech:  Clear and Coherent  Volume:  Normal  Mood:  Euthymic  Affect:  Congruent  Thought Process:  Goal Directed and Descriptions of Associations: Intact  Orientation:  Full (Time, Place, and Person)  Thought Content: Logical   Suicidal Thoughts:  No  Homicidal Thoughts:  No  Memory:  Immediate;   Fair Recent;   Fair Remote;   Fair  Judgement:  Fair  Insight:  Fair  Psychomotor Activity:  Normal  Concentration:  Concentration: Fair and Attention Span: Fair  Recall:  [employer] of Knowledge: Fair  Language: Fair  Akathisia:  No  Handed:  Right  AIMS (if indicated): not done  Assets:  Communication Skills Desire for Improvement Housing Social Support Talents/Skills Transportation Vocational/Educational  ADL's:  Intact  Cognition: WNL  Sleep:  Fair   Screenings: AUDIT    Flowsheet Row Admission  (Discharged) from 08/13/2019 in [facility]  Alcohol Use Disorder Identification Test Final Score (AUDIT) 0      GAD-7    Flowsheet Row Office Visit from 09/20/2019 in [facility] Office Visit from 03/10/2018 in [facility]  Total GAD-7 Score 6 12      PHQ2-9    Flowsheet Row Office Visit from 12/08/2021 in [facility] Office Visit from 01/13/2021 in [facility] Video Visit from 08/20/2020 in [facility] Office Visit from 09/20/2019 in [facility] Office Visit from 03/10/2018 in [facility]  PHQ-2 Total Score 0 1 0 6 6  PHQ-9 Total Score -- 6 -- 15 23      Flowsheet Row Office Visit from 12/08/2021 in [facility] ED from 10/30/2020 in [facility]  EMERGENCY DEPARTMENT ED from 10/25/2020 in [facility]   C-SSRS RISK CATEGORY Low Risk No Risk No Risk        Assessment and Plan: Brittne Kawasaki is a 26 year old Caucasian female, currently employed as a Runner, broadcasting/film/video, single, has a history of bipolar disorder, PTSD was evaluated in office today.  Patient currently denies any significant mood lability, PTSD symptoms, currently not on any psychotropic medications.  Will benefit from the following plan.  Plan Bipolar disorder in remission Patient is not on any psychotropic medications at this time.  Patient advised to reestablish care with writer or another provider as needed.  PTSD-stable As noted above.  Follow-up in clinic as needed.  This note was generated in part or whole with voice recognition software. Voice recognition is usually quite accurate but there are transcription errors that can and very often do occur. I apologize for any typographical errors that were not detected and corrected.        Jomarie Longs, MD 12/09/2021, 8:20 AM

## 2021-12-09 ENCOUNTER — Telehealth: Payer: Self-pay

## 2021-12-09 NOTE — Telephone Encounter (Signed)
pt called states she needs to speak with you about yesterday appt and the [employer] part.  I tired to get more information but she stated that she only wanted to discuss with you.

## 2021-12-09 NOTE — Telephone Encounter (Signed)
Returned call to patient.  Discussed her concerns.  Patient wanted to know why her after visit summary said bipolar disorder most recent episode depressed and PTSD.  Discussed with patient that was her visit diagnosis.  Reassured patient.

## 2022-04-06 ENCOUNTER — Emergency Department: Payer: Self-pay

## 2022-04-06 ENCOUNTER — Emergency Department
Admission: EM | Admit: 2022-04-06 | Discharge: 2022-04-06 | Disposition: A | Discharge status: Home / Self Care | Payer: Self-pay | Attending: Emergency Medicine | Admitting: Emergency Medicine

## 2022-04-06 ENCOUNTER — Other Ambulatory Visit: Payer: Self-pay

## 2022-04-06 DIAGNOSIS — R1031 Right lower quadrant pain: Secondary | ICD-10-CM | POA: Insufficient documentation

## 2022-04-06 LAB — CBC
HCT: 40.5 % (ref 36.0–46.0)
Hemoglobin: 13.2 g/dL (ref 12.0–15.0)
MCH: 28.5 pg (ref 26.0–34.0)
MCHC: 32.6 g/dL (ref 30.0–36.0)
MCV: 87.5 fL (ref 80.0–100.0)
Platelets: 348 10*3/uL (ref 150–400)
RBC: 4.63 MIL/uL (ref 3.87–5.11)
RDW: 12.6 % (ref 11.5–15.5)
WBC: 9.3 10*3/uL (ref 4.0–10.5)
nRBC: 0 % (ref 0.0–0.2)

## 2022-04-06 LAB — COMPREHENSIVE METABOLIC PANEL
ALT: 24 U/L (ref 0–44)
AST: 26 U/L (ref 15–41)
Albumin: 3.7 g/dL (ref 3.5–5.0)
Alkaline Phosphatase: 60 U/L (ref 38–126)
Anion gap: 7 (ref 5–15)
BUN: 13 mg/dL (ref 6–20)
CO2: 21 mmol/L — ABNORMAL LOW (ref 22–32)
Calcium: 8.6 mg/dL — ABNORMAL LOW (ref 8.9–10.3)
Chloride: 111 mmol/L (ref 98–111)
Creatinine, Ser: 0.85 mg/dL (ref 0.44–1.00)
GFR, Estimated: >60 mL/min (ref >60)
Glucose, Bld: 129 mg/dL — ABNORMAL HIGH (ref 70–99)
Potassium: 4 mmol/L (ref 3.5–5.1)
Sodium: 139 mmol/L (ref 135–145)
Total Bilirubin: 0.5 mg/dL (ref 0.3–1.2)
Total Protein: 6.9 g/dL (ref 6.5–8.1)

## 2022-04-06 LAB — URINALYSIS, ROUTINE W REFLEX MICROSCOPIC
Bilirubin Urine: NEGATIVE
Glucose, UA: NEGATIVE mg/dL
Ketones, ur: NEGATIVE mg/dL
Nitrite: NEGATIVE
Protein, ur: 30 mg/dL — AB
Specific Gravity, Urine: 1.031 — ABNORMAL HIGH (ref 1.005–1.030)
pH: 5 (ref 5.0–8.0)

## 2022-04-06 LAB — POC URINE PREG, ED: Preg Test, Ur: NEGATIVE

## 2022-04-06 MED ORDER — IOHEXOL 300 MG/ML  SOLN
100.0000 mL | Freq: Once | INTRAMUSCULAR | Status: AC | PRN
  Administered 2022-04-06: 100 mL via INTRAVENOUS

## 2022-04-06 MED ORDER — ONDANSETRON HCL 4 MG/2ML IJ SOLN
4.0000 mg | Freq: Once | INTRAMUSCULAR | Status: AC | PRN
  Administered 2022-04-06: 4 mg via INTRAVENOUS
  Filled 2022-04-06: qty 2

## 2022-04-06 MED ORDER — FENTANYL CITRATE PF 50 MCG/ML IJ SOSY
50.0000 ug | PREFILLED_SYRINGE | INTRAMUSCULAR | Status: DC | PRN
  Administered 2022-04-06: 50 ug via INTRAVENOUS
  Filled 2022-04-06: qty 1

## 2022-04-06 MED ORDER — IOHEXOL 350 MG/ML SOLN: 100.0000 mL | Freq: Once | INTRAVENOUS | Status: DC | PRN

## 2022-04-06 NOTE — ED Provider Notes (Signed)
[facility] Provider Note    Event Date/Time   First MD Initiated Contact with Patient 04/06/22 [phone]     (approximate)   History   Pelvic Pain and Vaginal Pain   HPI  Misty Peters is a 26 y.o. female with no significant past medical history who presents with complaints of right lower quadrant abdominal pain.  Patient reports she woke up with this severe pain in her right lower quadrant, has now improved significantly.  She is never had this before.  No history of abdominal surgeries.  No history of kidney stones.  No fevers chills or dysuria.  Just finished menstrual cycle     Physical Exam   Triage Vital Signs: ED Triage Vitals [04/06/22 0420]  Enc Vitals Group     BP 100/74     Pulse Rate 77     Resp 16     Temp 97.8 F (36.6 C)     Temp Source Oral     SpO2 100 %     Weight 74.8 kg (165 lb)     Height 1.575 m (5\' 2" )     Head Circumference      Peak Flow      Pain Score 10     Pain Loc      Pain Edu?      Excl. in GC?     Most recent vital signs: Vitals:   04/06/22 0420  BP: 100/74  Pulse: 77  Resp: 16  Temp: 97.8 F (36.6 C)  SpO2: 100%     General: Awake, no distress.  CV:  Good peripheral perfusion.  Resp:  Normal effort.  Abd:  No distention.  Minimal discomfort right lower quadrant to palpation, no CVA tenderness Other:     ED Results / Procedures / Treatments   Labs (all labs ordered are listed, but only abnormal results are displayed) Labs Reviewed  COMPREHENSIVE METABOLIC PANEL - Abnormal; Notable for the following components:      Result Value   CO2 21 (*)    Glucose, Bld 129 (*)    Calcium 8.6 (*)    All other components within normal limits  URINALYSIS, ROUTINE W REFLEX MICROSCOPIC - Abnormal; Notable for the following components:   Color, Urine YELLOW (*)    APPearance CLOUDY (*)    Specific Gravity, Urine 1.031 (*)    Hgb urine dipstick SMALL (*)    Protein, ur 30 (*)    Leukocytes,Ua SMALL (*)     Bacteria, UA RARE (*)    All other components within normal limits  CBC  POC URINE PREG, ED     EKG     RADIOLOGY CT viewed interpret by me, no inflammation surrounding the appendix    PROCEDURES:  Critical Care performed:   Procedures   MEDICATIONS ORDERED IN ED: Medications  fentaNYL (SUBLIMAZE) injection 50 mcg (50 mcg Intravenous Given 04/06/22 0435)  ondansetron (ZOFRAN) injection 4 mg (4 mg Intravenous Given 04/06/22 0434)  iohexol (OMNIPAQUE) 300 MG/ML solution 100 mL (100 mLs Intravenous Contrast Given 04/06/22 0624)     IMPRESSION / MDM / ASSESSMENT AND PLAN / ED COURSE  I reviewed the triage vital signs and the nursing notes. Patient's presentation is most consistent with acute presentation with potential threat to life or bodily function.  Patient presents with acute onset right lower quadrant abdominal pain, now significantly improved.  Differential includes ureterolithiasis, UTI, appendicitis, ovarian torsion  Pain is significantly improved, given rapid onset  suspicious for ureterolithiasis/passed stone.  Urine sample contaminated, some blood likely related to menstrual cycle.  CT negative for appendicitis ultrasound demonstrates normal ovaries, normal flow, no cysts.  Return precautions discussed at length, patient is comfortable with discharge, will treat with NSAIDs at home, mother and patient agree with this plan.        FINAL CLINICAL IMPRESSION(S) / ED DIAGNOSES   Final diagnoses:  Right lower quadrant abdominal pain     Rx / DC Orders   ED Discharge Orders     None        Note:  This document was prepared using Dragon voice recognition software and may include unintentional dictation errors.   Lavonia Drafts, MD 04/06/22 [phone]

## 2022-04-06 NOTE — ED Triage Notes (Signed)
Pt states onset approx one hour ago of sudden right sided pelvic pain that radiates to rlq and vaginal "stabbing". Pt denies known fever.

## 2022-04-06 NOTE — ED Notes (Signed)
Patient discharged to home per MD order. Patient in stable condition, and deemed medically cleared by ED provider for discharge. Discharge instructions reviewed with patient/family using "Teach Back"; verbalized understanding of medication education and administration, and information about follow-up care. Denies further concerns. ° °

## 2022-04-07 ENCOUNTER — Telehealth: Payer: Self-pay

## 2022-04-07 NOTE — Telephone Encounter (Signed)
Transition Care Management Unsuccessful Follow-up Telephone Call  Date of discharge and from where:  04/06/22, [facility] ED  Attempts:  1st Attempt  Reason for unsuccessful TCM follow-up call:  Left voice message    

## 2022-04-07 NOTE — Telephone Encounter (Signed)
Pt returned Brittany's call.   Please follow up with pt upon return.

## 2022-04-08 NOTE — Telephone Encounter (Signed)
Transition Care Management Follow-up Telephone Call Date of discharge and from where: 04/06/22, [facility] ED How have you been since you were released from the hospital? "Still sore, but getting better." Any questions or concerns? No  Items Reviewed: Did the pt receive and understand the discharge instructions provided? Yes  Medications obtained and verified? Yes  Other? No  Any new allergies since your discharge? No  Dietary orders reviewed? Yes Do you have support at home? Yes   Home Care and Equipment/Supplies: Were home health services ordered? no If so, what is the name of the agency? N/A  Has the agency set up a time to come to the patient's home? not applicable Were any new equipment or medical supplies ordered?  No What is the name of the medical supply agency? N/A Were you able to get the supplies/equipment? not applicable Do you have any questions related to the use of the equipment or supplies? No  Functional Questionnaire: (I = Independent and D = Dependent) ADLs: I  Bathing/Dressing- I  Meal Prep- I  Eating- I  Maintaining continence- I  Transferring/Ambulation- I  Managing Meds- I  Follow up appointments reviewed:  PCP Hospital f/u appt confirmed? No . Patient states she feels that she does not need to follow up at this time.  [facility] f/u appt confirmed? No   Are transportation arrangements needed? Yes  If their condition worsens, is the pt aware to call PCP or go to the Emergency Dept.? Yes Was the patient provided with contact information for the PCP's office or ED? Yes Was to pt encouraged to call back with questions or concerns? Yes

## 2022-07-06 ENCOUNTER — Ambulatory Visit: Payer: Self-pay

## 2022-07-06 NOTE — Telephone Encounter (Signed)
  Chief Complaint: Abdominal pain Symptoms: Pain comes and goes - severe, nausea from pain. Gets sweaty with pain. Frequency: 2-3 days Pertinent Negatives: Patient denies  Disposition: [] ED /[x] Urgent Care (no appt availability in office) / [] Appointment(In office/virtual)/ []   Virtual Care/ [] Home Care/ [] Refused Recommended Disposition /[]  Mobile Bus/ [x]  Follow-up with PCP Additional Notes: Pt has severe intermittent  abdominal pain, that is similar to pain 2-3 months ago. At that time she went to ED where they thought she had a kidney stone that passed.   Reason for Disposition  [1] MODERATE pain (e.g., interferes with normal activities) AND [2] pain comes and goes (cramps) AND [3] present > 24 hours  (Exception: Pain with Vomiting or Diarrhea - see that Guideline.)  Answer Assessment - Initial Assessment Questions 1. LOCATION: "Where does it hurt?"      Abdomen 2. RADIATION: "Does the pain shoot anywhere else?" (e.g., chest, back)     Up her chest 3. ONSET: "When did the pain begin?" (e.g., minutes, hours or days ago)      2-3 4. SUDDEN: "Gradual or sudden onset?"     Sudden 5. PATTERN "Does the pain come and go, or is it constant?"    - If it comes and goes: "How long does it last?" "Do you have pain now?"     (Note: Comes and goes means the pain is intermittent. It goes away completely between bouts.)    - If constant: "Is it getting better, staying the same, or getting worse?"      (Note: Constant means the pain never goes away completely; most serious pain is constant and gets worse.)      Comes and goes 6. SEVERITY: "How bad is the pain?"  (e.g., Scale 1-10; mild, moderate, or severe)    - MILD (1-3): Doesn't interfere with normal activities, abdomen soft and not tender to touch.     - MODERATE (4-7): Interferes with normal activities or awakens from sleep, abdomen tender to touch.     - SEVERE (8-10): Excruciating pain, doubled over, unable to do any  normal activities.       Severe 7. RECURRENT SYMPTOM: "Have you ever had this type of stomach pain before?" If Yes, ask: "When was the last time?" and "What happened that time?"      Yes 8. CAUSE: "What do you think is causing the stomach pain?"     Unsure  kidney stones 9. RELIEVING/AGGRAVATING FACTORS: "What makes it better or worse?" (e.g., antacids, bending or twisting motion, bowel movement)     If she does not move it feels better 10. OTHER SYMPTOMS: "Do you have any other symptoms?" (e.g., back pain, diarrhea, fever, urination pain, vomiting)       No - vomiting from pain 11. PREGNANCY: "Is there any chance you are pregnant?" "When was your last menstrual period?"       no  Protocols used: Abdominal Pain - [facility]

## 2022-07-08 ENCOUNTER — Encounter: Payer: Self-pay | Admitting: Nurse Practitioner

## 2022-07-08 ENCOUNTER — Ambulatory Visit: Payer: BC Managed Care – PPO | Admitting: Nurse Practitioner

## 2022-07-08 VITALS — BP 104/76 | HR 88 | Temp 97.8°F | Ht 62.01 in | Wt 158.4 lb

## 2022-07-08 DIAGNOSIS — N200 Calculus of kidney: Secondary | ICD-10-CM | POA: Diagnosis not present

## 2022-07-08 DIAGNOSIS — R7301 Impaired fasting glucose: Secondary | ICD-10-CM | POA: Insufficient documentation

## 2022-07-08 DIAGNOSIS — K529 Noninfective gastroenteritis and colitis, unspecified: Secondary | ICD-10-CM | POA: Diagnosis not present

## 2022-07-08 DIAGNOSIS — Z9151 Personal history of suicidal behavior: Secondary | ICD-10-CM | POA: Insufficient documentation

## 2022-07-08 LAB — URINALYSIS, ROUTINE W REFLEX MICROSCOPIC
Glucose, UA: NEGATIVE
Leukocytes,UA: NEGATIVE
Nitrite, UA: NEGATIVE
Specific Gravity, UA: 1.02 (ref 1.005–1.030)
Urobilinogen, Ur: 0.2 mg/dL (ref 0.2–1.0)
pH, UA: 5.5 (ref 5.0–7.5)

## 2022-07-08 LAB — MICROSCOPIC EXAMINATION: Bacteria, UA: NONE SEEN

## 2022-07-08 MED ORDER — TAMSULOSIN HCL 0.4 MG PO CAPS: 0.4000 mg | ORAL_CAPSULE | Freq: Every day | ORAL | 0 refills | Status: DC

## 2022-07-08 MED ORDER — ONDANSETRON HCL 4 MG PO TABS: 4.0000 mg | ORAL_TABLET | Freq: Three times a day (TID) | ORAL | 0 refills | Status: DC | PRN

## 2022-07-08 MED ORDER — CYCLOBENZAPRINE HCL 10 MG PO TABS: 10.0000 mg | ORAL_TABLET | Freq: Three times a day (TID) | ORAL | 0 refills | Status: DC | PRN

## 2022-07-08 NOTE — Progress Notes (Signed)
BP 104/76   Pulse 88   Temp 97.8 F (36.6 C) (Oral)   Ht 5' 2.01" (1.575 m)   Wt 158 lb 6.4 oz (71.8 kg)   LMP 07/07/2022   SpO2 96%   BMI 28.96 kg/m    Subjective:    Patient ID: Misty Peters, female    DOB: 18-Sep-1995, 27 y.o.   MRN: [id number]  HPI: Misty Peters is a 27 y.o. female  Chief Complaint  Patient presents with   Fatigue    For the past few days   Nausea    Patient states that she is feeling sick to her stomach started yesterday   Abdominal Pain    Has since resolved since she started her cycle   ABDOMINAL PAIN & FATIGUE She reports visit today for kidney stones -- changed diet and hoped it would help.  4 nights ago had one and then a three days ago had another one -- these passed on their own.  Had initial kidney stone on 04/06/22 -- CT done in ER was reassuring with no stone found, they think it had passed by time she went for imaging.  On labs sugars were elevated in ER.    She reports no further abdominal pain is sore due to cycle starting.  Is feeling tired as lack of sleep recently with kidney stones passing and pain.  The past two nights has had a stomach virus present as well.   Duration:months Onset: sudden Severity: 3/10 Quality: dull, aching, and throbbing Location:  LLQ, RLQ, and diffuse Episode duration: minutes to hours Radiation: no occasionally legs will hurt Frequency: intermittent Alleviating factors: nothing Aggravating factors: moving Status: fluctuating Treatments attempted: Advil and Midol Fever: no Nausea: yes Vomiting: yes Weight loss: no Decreased appetite: yes Diarrhea: yes Constipation: no Blood in stool: no Heartburn: no Jaundice: no Rash: no Dysuria/urinary frequency: no Hematuria: no History of sexually transmitted disease: no Recurrent NSAID use: no   Relevant past medical, surgical, family and social history reviewed and updated as indicated. Interim medical history since our last visit  reviewed. Allergies and medications reviewed and updated.  Review of Systems  Constitutional:  Positive for appetite change and fatigue. Negative for activity change, chills, diaphoresis and fever.  Respiratory: Negative.    Cardiovascular: Negative.   Gastrointestinal:  Positive for abdominal pain, diarrhea (with GI bug at present), nausea and vomiting. Negative for constipation.  Genitourinary:  Positive for hematuria. Negative for decreased urine volume, dysuria, frequency and urgency.  Neurological: Negative.   Psychiatric/Behavioral: Negative.     Per HPI unless specifically indicated above     Objective:    BP 104/76   Pulse 88   Temp 97.8 F (36.6 C) (Oral)   Ht 5' 2.01" (1.575 m)   Wt 158 lb 6.4 oz (71.8 kg)   LMP 07/07/2022   SpO2 96%   BMI 28.96 kg/m   Wt Readings from Last 3 Encounters:  07/08/22 158 lb 6.4 oz (71.8 kg)  04/06/22 165 lb (74.8 kg)  03/20/21 178 lb (80.7 kg)    Physical Exam Vitals and nursing note reviewed.  Constitutional:      General: She is awake. She is not in acute distress.    Appearance: She is well-developed, well-groomed and overweight. She is not ill-appearing or toxic-appearing.  HENT:     Head: Normocephalic.     Right Ear: Hearing and external ear normal.     Left Ear: Hearing and external ear normal.  Eyes:     General: Lids are normal.        Right eye: No discharge.        Left eye: No discharge.     Conjunctiva/sclera: Conjunctivae normal.     Pupils: Pupils are equal, round, and reactive to light.  Neck:     Thyroid: No thyromegaly.     Vascular: No carotid bruit.  Cardiovascular:     Rate and Rhythm: Normal rate and regular rhythm.     Heart sounds: Normal heart sounds. No murmur heard.    No gallop.  Pulmonary:     Effort: Pulmonary effort is normal.     Breath sounds: Normal breath sounds.  Abdominal:     General: Bowel sounds are normal. There is no distension.     Palpations: Abdomen is soft. There is no  hepatomegaly or splenomegaly.     Tenderness: There is generalized abdominal tenderness. There is no right CVA tenderness or left CVA tenderness.     Hernia: No hernia is present.  Musculoskeletal:     Cervical back: Normal range of motion and neck supple.     Right lower leg: No edema.     Left lower leg: No edema.  Lymphadenopathy:     Head:     Right side of head: No submental, submandibular, tonsillar, preauricular or posterior auricular adenopathy.     Left side of head: No submental, submandibular, tonsillar, preauricular or posterior auricular adenopathy.  Skin:    General: Skin is warm and dry.  Neurological:     Mental Status: She is alert and oriented to person, place, and time.  Psychiatric:        Attention and Perception: Attention normal.        Mood and Affect: Mood normal.        Speech: Speech normal.        Behavior: Behavior normal. Behavior is cooperative.        Thought Content: Thought content normal.     Results for orders placed or performed during the hospital encounter of 04/06/22  Comprehensive metabolic panel  Result Value Ref Range   Sodium 139 135 - 145 mmol/L   Potassium 4.0 3.5 - 5.1 mmol/L   Chloride 111 98 - 111 mmol/L   CO2 21 (L) 22 - 32 mmol/L   Glucose, Bld 129 (H) 70 - 99 mg/dL   BUN 13 6 - 20 mg/dL   Creatinine, Ser 0.85 0.44 - 1.00 mg/dL   Calcium 8.6 (L) 8.9 - 10.3 mg/dL   Total Protein 6.9 6.5 - 8.1 g/dL   Albumin 3.7 3.5 - 5.0 g/dL   AST 26 15 - 41 U/L   ALT 24 0 - 44 U/L   Alkaline Phosphatase 60 38 - 126 U/L   Total Bilirubin 0.5 0.3 - 1.2 mg/dL   GFR, Estimated >60 >60 mL/min   Anion gap 7 5 - 15  CBC  Result Value Ref Range   WBC 9.3 4.0 - 10.5 K/uL   RBC 4.63 3.87 - 5.11 MIL/uL   Hemoglobin 13.2 12.0 - 15.0 g/dL   HCT 40.5 36.0 - 46.0 %   MCV 87.5 80.0 - 100.0 fL   MCH 28.5 26.0 - 34.0 pg   MCHC 32.6 30.0 - 36.0 g/dL   RDW 12.6 11.5 - 15.5 %   Platelets 348 150 - 400 K/uL   nRBC 0.0 0.0 - 0.2 %  Urinalysis,  Routine w reflex microscopic Urine, Clean Catch  Result Value Ref Range   Color, Urine YELLOW (A) YELLOW   APPearance CLOUDY (A) CLEAR   Specific Gravity, Urine 1.031 (H) 1.005 - 1.030   pH 5.0 5.0 - 8.0   Glucose, UA NEGATIVE NEGATIVE mg/dL   Hgb urine dipstick SMALL (A) NEGATIVE   Bilirubin Urine NEGATIVE NEGATIVE   Ketones, ur NEGATIVE NEGATIVE mg/dL   Protein, ur 30 (A) NEGATIVE mg/dL   Nitrite NEGATIVE NEGATIVE   Leukocytes,Ua SMALL (A) NEGATIVE   RBC / HPF 6-10 0 - 5 RBC/hpf   WBC, UA 11-20 0 - 5 WBC/hpf   Bacteria, UA RARE (A) NONE SEEN   Squamous Epithelial / HPF 11-20 0 - 5   Mucus PRESENT   POC urine preg, ED  Result Value Ref Range   Preg Test, Ur NEGATIVE NEGATIVE      Assessment & Plan:   Problem List Items Addressed This Visit       Digestive   Gastroenteritis    Acute for 24 hours.  Zofran send in and recommend good hand washing at home + plenty of rest. BRAT diet and increase fluid intake if possible + electrolyte drinks.  Return to office if worsening or ongoing.        Endocrine   IFG (impaired fasting glucose)    Noted on labs in ER -- ?IV therapy at time.  Check A1c today.      Relevant Orders   TSH   HgB A1c     Genitourinary   Kidney stone - Primary    Acute and ongoing issues since October. Will place referral to urology.  Start Flomax daily, Flexeril, and Zofran for nausea PRN.  Recommend heavy focus on continued diet changes.  Labs obtained today.      Relevant Medications   ondansetron (ZOFRAN) 4 MG tablet   tamsulosin (FLOMAX) 0.4 MG CAPS capsule   cyclobenzaprine (FLEXERIL) 10 MG tablet   Other Relevant Orders   CBC with Differential/Platelet   Comprehensive metabolic panel   Urinalysis, Routine w reflex microscopic   Ambulatory referral to Urology     Follow up plan: Return in about 3 weeks (around 07/29/2022) for Kidney stones with Santiago Glad.

## 2022-07-08 NOTE — Assessment & Plan Note (Signed)
Acute for 24 hours.  Zofran send in and recommend good hand washing at home + plenty of rest. BRAT diet and increase fluid intake if possible + electrolyte drinks.  Return to office if worsening or ongoing.

## 2022-07-08 NOTE — Patient Instructions (Signed)
Kidney Stones  Kidney stones are solid, rock-like deposits that form inside of the kidneys. The kidneys are a pair of organs that make urine. A kidney stone may form in a kidney and move into other parts of the urinary tract, including the tubes that connect the kidneys to the bladder (ureters), the bladder, and the tube that carries urine out of the body (urethra). As the stone moves through these areas, it can cause intense pain and block the flow of urine. Kidney stones are created when high levels of certain minerals are found in the urine. The stones are usually passed out of the body through urination, but in some cases, medical treatment may be needed to remove them. What are the causes? Kidney stones may be caused by: A condition in which certain glands produce too much parathyroid hormone (primary hyperparathyroidism), which causes too much calcium buildup in the blood. A buildup of uric acid crystals in the bladder (hyperuricosuria). Uric acid is a chemical that the body produces when you eat certain foods. It usually leaves the body in the urine. Narrowing (stricture) of one or both of the ureters. A kidney blockage that is present at birth (congenital obstruction). Past surgery on the kidney or the ureters. What increases the risk? The following factors may make you more likely to develop this condition: Having had a kidney stone in the past. Having a family history of kidney stones. Not drinking enough water. Eating a diet that is high in protein, salt (sodium), or sugar. Being overweight or obese. What are the signs or symptoms? Symptoms of a kidney stone may include: Pain in the side of the abdomen, right below the ribs (flank pain). Pain usually spreads (radiates) to the groin. Needing to urinate often or urgently. Painful urination. Blood in the urine (hematuria). Nausea. Vomiting. Fever and chills. How is this diagnosed? This condition may be diagnosed based on: Your  symptoms and medical history. A physical exam. Blood tests. Urine tests. These may be done before and after the stone passes out of your body through urination. Imaging tests, such as a CT scan, abdominal X-ray, or ultrasound. A procedure to examine the inside of the bladder (cystoscopy). How is this treated? Treatment for kidney stones depends on the size, location, and makeup of the stones. Kidney stones will often pass out of the body through urination. You may need to: Increase your fluid intake to help pass the stone. In some cases, you may be given fluids through an IV and may need to be monitored in the hospital. Take medicine for pain. Make changes in your diet to help prevent kidney stones from coming back. Sometimes, procedures are needed to remove a kidney stone. This may involve: A procedure to break up kidney stones using: A focused beam of light (laser therapy). Shock waves (extracorporeal shock wave lithotripsy). Surgery to remove kidney stones. This may be needed if you have severe pain or have stones that block your urinary tract. Follow these instructions at home: Medicines Take over-the-counter and prescription medicines only as told by your health care provider. Ask your health care provider if the medicine prescribed to you requires you to avoid driving or using heavy machinery. Eating and drinking Drink enough fluid to keep your urine pale yellow. You may be instructed to drink at least 8-10 glasses of water each day. This will help you pass the kidney stone. If directed, change your diet. This may include: Limiting how much sodium you eat. Eating more fruits   and vegetables. Limiting how much animal protein you eat. Animal proteins include red meat, poultry, fish, and eggs. Eating a normal amount of calcium (1,000-1,300 mg per day). Follow instructions from your health care provider about eating or drinking restrictions. General instructions Collect urine samples as  told by your health care provider. You may need to collect a urine sample: 24 hours after you pass the stone. 8-12 weeks after you pass the kidney stone, and every 6-12 months after that. Strain your urine every time you urinate, for as long as directed. Use the strainer that your health care provider recommends. Do not throw out the kidney stone after passing it. Keep the stone so it can be tested by your health care provider. Testing the makeup of your kidney stone may help prevent you from getting kidney stones in the future. Keep all follow-up visits. You may need follow-up X-rays or ultrasounds to make sure that your stone has passed. How is this prevented? To prevent another kidney stone: Drink enough fluid to keep your urine pale yellow. This is the best way to prevent kidney stones. Eat a healthy diet. Follow recommendations from your health care provider about foods to avoid. Recommendations vary depending on the type of kidney stone that you have. You may be instructed to eat a low-protein diet. Maintain a healthy weight. Where to find more information National Kidney Foundation (NKF): www.kidney.org [facility] [employer] [facility] (U[facility]): www.urologyhealth.org Contact a health care provider if: You have pain that gets worse or does not get better with medicine. Get help right away if: You have a fever or chills. You develop severe pain. You develop new abdominal pain. You faint. You are unable to urinate. Summary Kidney stones are solid, rock-like deposits that form inside of the kidneys. Kidney stones can cause nausea, vomiting, blood in the urine, abdominal pain, and the urge to urinate often. Treatment for kidney stones depends on the size, location, and makeup of the stones. Kidney stones will often pass out of the body through urination. Kidney stones can be prevented by drinking enough fluids, eating a healthy diet, and maintaining a healthy weight. This information is not intended  to replace advice given to you by your health care provider. Make sure you discuss any questions you have with your health care provider. Document Revised: 09/17/2021 Document Reviewed: 09/17/2021 Elsevier Patient Education  2023 [employer]  

## 2022-07-08 NOTE — Assessment & Plan Note (Signed)
Noted on labs in ER -- ?IV therapy at time.  Check A1c today.

## 2022-07-08 NOTE — Assessment & Plan Note (Signed)
Acute and ongoing issues since October. Will place referral to urology.  Start Flomax daily, Flexeril, and Zofran for nausea PRN.  Recommend heavy focus on continued diet changes.  Labs obtained today.

## 2022-07-08 NOTE — Progress Notes (Signed)
Contacted via MyChart   Urine returned and did show blood, which is most likely related to cycle, no signs of infection on urine.  You are a little dehydrated with a trace of ketones, increase water intake.  Any questions?

## 2022-07-09 LAB — CBC WITH DIFFERENTIAL/PLATELET
Basophils Absolute: 0 10*3/uL (ref 0.0–0.2)
Basos: 0 %
EOS (ABSOLUTE): 0.1 10*3/uL (ref 0.0–0.4)
Eos: 1 %
Hematocrit: 38.1 % (ref 34.0–46.6)
Hemoglobin: 12.8 g/dL (ref 11.1–15.9)
Immature Grans (Abs): 0 10*3/uL (ref 0.0–0.1)
Immature Granulocytes: 0 %
Lymphocytes Absolute: 3.3 10*3/uL — ABNORMAL HIGH (ref 0.7–3.1)
Lymphs: 28 %
MCH: 29.4 pg (ref 26.6–33.0)
MCHC: 33.6 g/dL (ref 31.5–35.7)
MCV: 87 fL (ref 79–97)
Monocytes Absolute: 0.6 10*3/uL (ref 0.1–0.9)
Monocytes: 5 %
Neutrophils Absolute: 7.6 10*3/uL — ABNORMAL HIGH (ref 1.4–7.0)
Neutrophils: 66 %
Platelets: 417 10*3/uL (ref 150–450)
RBC: 4.36 x10E6/uL (ref 3.77–5.28)
RDW: 13.1 % (ref 11.7–15.4)
WBC: 11.7 10*3/uL — ABNORMAL HIGH (ref 3.4–10.8)

## 2022-07-09 LAB — COMPREHENSIVE METABOLIC PANEL
ALT: 23 IU/L (ref 0–32)
AST: 25 IU/L (ref 0–40)
Albumin/Globulin Ratio: 1.5 (ref 1.2–2.2)
Albumin: 4.3 g/dL (ref 4.0–5.0)
Alkaline Phosphatase: 73 IU/L (ref 44–121)
BUN/Creatinine Ratio: 13 (ref 9–23)
BUN: 13 mg/dL (ref 6–20)
Bilirubin Total: 0.3 mg/dL (ref 0.0–1.2)
CO2: 24 mmol/L (ref 20–29)
Calcium: 10.8 mg/dL — ABNORMAL HIGH (ref 8.7–10.2)
Chloride: 105 mmol/L (ref 96–106)
Creatinine, Ser: 1 mg/dL (ref 0.57–1.00)
Globulin, Total: 2.8 g/dL (ref 1.5–4.5)
Glucose: 85 mg/dL (ref 70–99)
Potassium: 4.1 mmol/L (ref 3.5–5.2)
Sodium: 145 mmol/L — ABNORMAL HIGH (ref 134–144)
Total Protein: 7.1 g/dL (ref 6.0–8.5)
eGFR: 80 mL/min/{1.73_m2} (ref >59)

## 2022-07-09 LAB — HEMOGLOBIN A1C
Est. average glucose Bld gHb Est-mCnc: 111 mg/dL
Hgb A1c MFr Bld: 5.5 % (ref 4.8–5.6)

## 2022-07-09 LAB — TSH: TSH: 3.2 u[IU]/mL (ref 0.450–4.500)

## 2022-07-09 NOTE — Progress Notes (Signed)
Contacted via MyChart   Good morning Misty Peters, your labs have returned, overall these are stable with exception of white blood cell and neutrophils mildly elevated -- I suspect this is from your current stomach bug.  Sodium also a little high, try to increase water intake and reduce salt intake.  Calcium a little high, although often on review this is normal for you -- suspect related to current illness. Any questions? Keep being amazing!!  Thank you for allowing me to participate in your care.  I appreciate you. Kindest regards, Cheyanna Strick

## 2022-07-17 ENCOUNTER — Ambulatory Visit
Admission: EM | Admit: 2022-07-17 | Discharge: 2022-07-17 | Disposition: A | Discharge status: Home / Self Care | Payer: BC Managed Care – PPO | Attending: Emergency Medicine | Admitting: Emergency Medicine

## 2022-07-17 ENCOUNTER — Ambulatory Visit (INDEPENDENT_AMBULATORY_CARE_PROVIDER_SITE_OTHER): Payer: BC Managed Care – PPO

## 2022-07-17 DIAGNOSIS — U071 COVID-19: Secondary | ICD-10-CM | POA: Diagnosis present

## 2022-07-17 DIAGNOSIS — R059 Cough, unspecified: Secondary | ICD-10-CM | POA: Diagnosis not present

## 2022-07-17 DIAGNOSIS — R509 Fever, unspecified: Secondary | ICD-10-CM

## 2022-07-17 LAB — RESP PANEL BY RT-PCR (RSV, FLU A&B, COVID)  RVPGX2
Influenza A by PCR: NEGATIVE
Influenza B by PCR: NEGATIVE
Resp Syncytial Virus by PCR: NEGATIVE
SARS Coronavirus 2 by RT PCR: POSITIVE — AB

## 2022-07-17 MED ORDER — BENZONATATE 100 MG PO CAPS: 200.0000 mg | ORAL_CAPSULE | Freq: Three times a day (TID) | ORAL | 0 refills | Status: DC

## 2022-07-17 MED ORDER — PROMETHAZINE-DM 6.25-15 MG/5ML PO SYRP: 5.0000 mL | ORAL_SOLUTION | Freq: Four times a day (QID) | ORAL | 0 refills | Status: DC | PRN

## 2022-07-17 MED ORDER — IPRATROPIUM BROMIDE 0.06 % NA SOLN: 2.0000 | Freq: Four times a day (QID) | NASAL | 12 refills | Status: DC

## 2022-07-17 NOTE — Discharge Instructions (Addendum)
Tested positive for COVID-19 today.  You will need to quarantine for 5 days from onset of your symptoms.  After 5 days you can break quarantine if your symptoms have improved and you have not had a fever without taking Tylenol or ibuprofen for 24 hours.  You will need to wear a mask around others for an additional 5 days.  Use over-the-counter Tylenol and/or ibuprofen according to package instructions as needed for fever and body aches.  Use the Atrovent nasal spray, 2 squirts in each nostril every 6 hours, as needed for runny nose and postnasal drip.  Use the Tessalon Perles every 8 hours during the day.  Take them with a small sip of water.  They may give you some numbness to the base of your tongue or a metallic taste in your mouth, this is normal.  Use the Promethazine DM cough syrup at bedtime for cough and congestion.  It will make you drowsy so do not take it during the day.  You need to monitor for worsening respiratory symptoms.  Especially if you develop shortness of breath, you are short of breath at rest, you feel as though you cannot catch your breath, you cannot speak in full sentences, or your lips begin turning blue you need to call 911 or go to the ER.

## 2022-07-17 NOTE — ED Provider Notes (Signed)
[facility]    CSN: [id number] Arrival date & time: 07/17/22  1037      History   Chief Complaint Chief Complaint  Patient presents with   Fever   Nasal Congestion   Generalized Body Aches    HPI Misty Peters is a 27 y.o. female.   HPI  27 year old female here for evaluation of respiratory complaints.  The patient reports that she began feeling achy yesterday while at work but in the middle of night she woke up and she was having chills and was unable to get warm despite a heating pad and multiple blankets.  This morning she had a measured fever of 102 and is complaining of runny nose and nasal congestion, sore throat, bilateral ear pain, headache, productive cough for green sputum, and diarrhea.  She denies any shortness of breath or wheezing.  No nausea or vomiting.  She does work in a daycare.  Past Medical History:  Diagnosis Date   Anxiety    Bipolar disorder with depression (HCC) 11/07/2015   Depression    Headache     Patient Active Problem List   Diagnosis Date Noted   History of suicide attempt 07/08/2022   Kidney stone 07/08/2022   IFG (impaired fasting glucose) 07/08/2022   Gastroenteritis 07/08/2022   Bipolar disorder, in full remission, most recent episode depressed (HCC) 08/07/2020   High risk medication use 11/17/2019   MDD (major depressive disorder) 08/13/2019   PTSD (post-traumatic stress disorder) 08/13/2019   Migraine 03/13/2018   Suicidal ideations 02/08/2018   Encounter for BCP (birth control pills) initial prescription 11/15/2016   Anxiety     Past Surgical History:  Procedure Laterality Date   GUM SURGERY     SALIVARY STONE REMOVAL     WISDOM TOOTH EXTRACTION      OB History   No obstetric history on file.      Home Medications    Prior to Admission medications   Medication Sig Start Date End Date Taking? Authorizing Provider  benzonatate (TESSALON) 100 MG capsule Take 2 capsules (200 mg total) by mouth every  8 (eight) hours. 07/17/22  Yes Becky Augusta, NP  cyclobenzaprine (FLEXERIL) 10 MG tablet Take 1 tablet (10 mg total) by mouth 3 (three) times daily as needed for muscle spasms. 07/08/22  Yes Cannady, Jolene T, NP  ipratropium (ATROVENT) 0.06 % nasal spray Place 2 sprays into both nostrils 4 (four) times daily. 07/17/22  Yes Becky Augusta, NP  promethazine-dextromethorphan (PROMETHAZINE-DM) 6.25-15 MG/5ML syrup Take 5 mLs by mouth 4 (four) times daily as needed. 07/17/22  Yes Becky Augusta, NP  tamsulosin (FLOMAX) 0.4 MG CAPS capsule Take 1 capsule (0.4 mg total) by mouth daily. 07/08/22  Yes Cannady, Jolene T, NP  amitriptyline (ELAVIL) 50 MG tablet Take 1 tablet (50 mg total) by mouth at bedtime. 03/10/18 03/16/19  Particia Nearing, PA-C  Norgestimate-Ethinyl Estradiol Triphasic (ORTHO TRI-CYCLEN LO) 0.18/0.215/0.25 MG-25 MCG tab Take 1 tablet by mouth daily. Patient not taking: Reported on 02/07/2018 11/10/16 03/16/19  Kerman Passey, MD  prazosin (MINIPRESS) 1 MG capsule Take 1 capsule (1 mg total) by mouth at bedtime. 08/20/20 10/25/20  Jomarie Longs, MD  SUMAtriptan (IMITREX) 50 MG tablet Take 1 tablet (50 mg total) by mouth every 2 (two) hours as needed for migraine. May repeat in 2 hours if headache persists or recurs. 03/10/18 03/16/19  Particia Nearing, PA-C  traZODone (DESYREL) 50 MG tablet Take 1-1.5 tablets (50-75 mg total) by mouth at  bedtime as needed for sleep. 03/21/20 10/25/20  Jomarie Longs, MD    Family History Family History  Problem Relation Age of Onset   Hyperlipidemia Father    Hypertension Father    Diabetes Father    Heart disease Paternal Grandmother    Stroke Paternal Grandmother    Cancer Paternal Grandmother        lung and breast   Bipolar disorder Paternal Grandmother    ADD / ADHD Paternal Grandmother    Cancer Maternal Grandmother        breast   Bipolar disorder Paternal Aunt    Drug abuse Other    COPD Neg Hx     Social History Social History    Tobacco Use   Smoking status: Never   Smokeless tobacco: Never  Vaping Use   Vaping Use: Never used  Substance Use Topics   Alcohol use: No    Alcohol/week: 0.0 standard drinks of alcohol   Drug use: No     Allergies   Patient has no known allergies.   Review of Systems Review of Systems  Constitutional:  Positive for chills and fever.  HENT:  Positive for congestion, ear pain, rhinorrhea and sore throat.   Respiratory:  Positive for cough. Negative for shortness of breath and wheezing.   Gastrointestinal:  Positive for diarrhea. Negative for nausea and vomiting.  Musculoskeletal:  Positive for arthralgias and myalgias.  Skin:  Negative for rash.  Neurological:  Positive for headaches.     Physical Exam Triage Vital Signs ED Triage Vitals [07/17/22 1132]  Enc Vitals Group     BP      Pulse      Resp      Temp      Temp src      SpO2      Weight 160 lb (72.6 kg)     Height 5\' 2"  (1.575 m)     Head Circumference      Peak Flow      Pain Score 3     Pain Loc      Pain Edu?      Excl. in GC?    No data found.  Updated Vital Signs BP 105/71 (BP Location: Left Arm)   Pulse 100   Temp 98.4 F (36.9 C) (Oral)   Resp 16   Ht 5\' 2"  (1.575 m)   Wt 160 lb (72.6 kg)   LMP 07/07/2022   SpO2 98%   BMI 29.26 kg/m   Visual Acuity Right Eye Distance:   Left Eye Distance:   Bilateral Distance:    Right Eye Near:   Left Eye Near:    Bilateral Near:     Physical Exam Vitals and nursing note reviewed.  Constitutional:      Appearance: Normal appearance. She is not ill-appearing.  HENT:     Head: Normocephalic and atraumatic.     Right Ear: Tympanic membrane, ear canal and external ear normal. There is no impacted cerumen.     Left Ear: Tympanic membrane, ear canal and external ear normal. There is no impacted cerumen.     Nose: Congestion and rhinorrhea present.     Comments: Nasal mucosa is erythematous and edematous with clear discharge in both nares.     Mouth/Throat:     Mouth: Mucous membranes are moist.     Pharynx: Oropharynx is clear. Posterior oropharyngeal erythema present. No oropharyngeal exudate.     Comments: Patient has erythema to the posterior oropharynx  but no injection.  Tonsillar pillars are free of erythema, edema, or exudate. Cardiovascular:     Rate and Rhythm: Normal rate and regular rhythm.     Pulses: Normal pulses.     Heart sounds: Normal heart sounds. No murmur heard.    No friction rub. No gallop.  Pulmonary:     Effort: Pulmonary effort is normal.     Breath sounds: Rales present. No wheezing or rhonchi.     Comments: Course crackles in the left lung base posteriorly. Musculoskeletal:     Cervical back: Normal range of motion and neck supple.  Lymphadenopathy:     Cervical: No cervical adenopathy.  Skin:    General: Skin is warm and dry.     Capillary Refill: Capillary refill takes less than 2 seconds.     Findings: No erythema or rash.  Neurological:     General: No focal deficit present.     Mental Status: She is alert and oriented to person, place, and time.  Psychiatric:        Mood and Affect: Mood normal.        Behavior: Behavior normal.        Thought Content: Thought content normal.        Judgment: Judgment normal.      UC Treatments / Results  Labs (all labs ordered are listed, but only abnormal results are displayed) Labs Reviewed  RESP PANEL BY RT-PCR (RSV, FLU A&B, COVID)  [id number] - Abnormal; Notable for the following components:      Result Value   SARS Coronavirus 2 by RT PCR POSITIVE (*)    All other components within normal limits    EKG   Radiology DG Chest 2 View  Result Date: 07/17/2022 CLINICAL DATA:  Fever and productive cough. EXAM: CHEST - 2 VIEW COMPARISON:  Oct 30, 2020 FINDINGS: The cardiomediastinal silhouette is normal in contour. No pleural effusion. No pneumothorax. No acute pleuroparenchymal abnormality. Visualized abdomen is unremarkable. No acute osseous  abnormality noted. IMPRESSION: No acute cardiopulmonary abnormality. Electronically Signed   By: Valentino Saxon M.D.   On: 07/17/2022 12:23    Procedures Procedures (including critical care time)  Medications Ordered in UC Medications - No data to display  Initial Impression / Assessment and Plan / UC Course  I have reviewed the triage vital signs and the nursing notes.  Pertinent labs & imaging results that were available during my care of the patient were reviewed by me and considered in my medical decision making (see chart for details).   Patient is a pleasant, nontoxic-appearing 36 old female here for evaluation of respiratory complaints as outlined in HPI above.  On exam patient has a very moist rattling cough in the exam room.  Speaking in full sentences or taking deep breath will trigger the cough.  When auscultating her chest she does have coarse crackles in the left lung base posteriorly.  She does have upper respiratory inflammation as evidenced by the inflamed nasal mucosa with erythema and clear rhinorrhea.  Also clear postnasal drip.  Patient was had a measured fever at home up to 102 she is currently 98.4 but she has antipyretics on board.  Given her mixture of respiratory symptoms there is definitely concern for possible influenza or COVID and I will order PCR for both.  Given the coarse crackles in her lung base with productive cough for green sputum I am concerned she may also have a pneumonia and I will order a chest  x-ray.  Chest x-ray independently reviewed and evaluated by me.  Impression: The lung fields are well aerated.  Costophrenic angles are crisp.  No infiltrates or effusion noted.  Radiology read is pending.  Respiratory panel is positive for COVID-19.  Negative for influenza and RSV.  I will discharge patient home with a diagnosis of COVID-19 and have her quarantine for 5 days from onset of her symptoms.  She can break quarantine after 5 days but she has to wear  mask around others for additional 5 days.  I will prescribe Atrovent nasal spray to help with nasal congestion, Tessalon Perles and Promethazine DM cough syrup to help with cough and congestion.  ER and return precautions reviewed.   Final Clinical Impressions(s) / UC Diagnoses   Final diagnoses:  [id number]     Discharge Instructions      Tested positive for COVID-19 today.  You will need to quarantine for 5 days from onset of your symptoms.  After 5 days you can break quarantine if your symptoms have improved and you have not had a fever without taking Tylenol or ibuprofen for 24 hours.  You will need to wear a mask around others for an additional 5 days.  Use over-the-counter Tylenol and/or ibuprofen according to package instructions as needed for fever and body aches.  Use the Atrovent nasal spray, 2 squirts in each nostril every 6 hours, as needed for runny nose and postnasal drip.  Use the Tessalon Perles every 8 hours during the day.  Take them with a small sip of water.  They may give you some numbness to the base of your tongue or a metallic taste in your mouth, this is normal.  Use the Promethazine DM cough syrup at bedtime for cough and congestion.  It will make you drowsy so do not take it during the day.  You need to monitor for worsening respiratory symptoms.  Especially if you develop shortness of breath, you are short of breath at rest, you feel as though you cannot catch your breath, you cannot speak in full sentences, or your lips begin turning blue you need to call 911 or go to the ER.     ED Prescriptions     Medication Sig Dispense Auth. Provider   benzonatate (TESSALON) 100 MG capsule Take 2 capsules (200 mg total) by mouth every 8 (eight) hours. 21 capsule Margarette Canada, NP   ipratropium (ATROVENT) 0.06 % nasal spray Place 2 sprays into both nostrils 4 (four) times daily. 15 mL Margarette Canada, NP   promethazine-dextromethorphan (PROMETHAZINE-DM) 6.25-15 MG/5ML syrup  Take 5 mLs by mouth 4 (four) times daily as needed. 118 mL Margarette Canada, NP      PDMP not reviewed this encounter.   Margarette Canada, NP 07/17/22 1229

## 2022-07-17 NOTE — ED Triage Notes (Signed)
Pt c/o runny nose,bodyaches,chills & fever up to 102 x1 day.

## 2022-07-30 ENCOUNTER — Ambulatory Visit: Payer: Self-pay | Admitting: Nurse Practitioner

## 2022-08-19 ENCOUNTER — Ambulatory Visit: Payer: BC Managed Care – PPO | Admitting: Urology

## 2022-09-22 ENCOUNTER — Ambulatory Visit: Payer: BC Managed Care – PPO | Admitting: Urology

## 2022-09-30 ENCOUNTER — Ambulatory Visit: Payer: Self-pay | Admitting: *Deleted

## 2022-09-30 ENCOUNTER — Ambulatory Visit: Payer: 59 | Admitting: Unknown Physician Specialty

## 2022-09-30 VITALS — BP 117/78 | HR 91 | Temp 98.4°F | Ht 62.01 in | Wt 158.2 lb

## 2022-09-30 DIAGNOSIS — J0100 Acute maxillary sinusitis, unspecified: Secondary | ICD-10-CM

## 2022-09-30 MED ORDER — AMOXICILLIN-POT CLAVULANATE 875-125 MG PO TABS: 1.0000 | ORAL_TABLET | Freq: Two times a day (BID) | ORAL | 0 refills | Status: DC

## 2022-09-30 NOTE — Telephone Encounter (Signed)
  Chief Complaint: sore throat Symptoms: cough, nasal congestion, sore throat Frequency: started 3/23 Pertinent Negatives: Patient denies fever today Disposition: [] ED /[] Urgent Care (no appt availability in office) / [x] Appointment(In office/virtual)/ []  [facility]/ [] Home Care/ [] Refused Recommended Disposition /[] [facility]/ []  Follow-up with PCP Additional Notes: Patient states she has severe sore throat- swollen glands making it hard to swallow. Appointment scheduled

## 2022-09-30 NOTE — Progress Notes (Signed)
BP 117/78   Pulse 91   Temp 98.4 F (36.9 C) (Oral)   Ht 5' 2.01" (1.575 m)   Wt 158 lb 3.2 oz (71.8 kg)   SpO2 97%   BMI 28.93 kg/m    Subjective:    Patient ID: Misty Peters, female    DOB: 25-Jun-1995, 27 y.o.   MRN: [id number]  HPI: Misty Peters is a 27 y.o. female  Chief Complaint  Patient presents with   Sore Throat    Cough, congestion started march 23   Sore Throat  Chronicity: 17 days. The current episode started 1 to 4 weeks ago. The problem has been gradually worsening. Neither side of throat is experiencing more pain than the other. The maximum temperature recorded prior to her arrival was 101 - 101.9 F. The fever has been present for 1 to 2 days. Associated symptoms include congestion, coughing, ear pain, headaches, a hoarse voice, a plugged ear sensation, shortness of breath, swollen glands and trouble swallowing. Pertinent negatives include no diarrhea, ear discharge or stridor.   Covid negative at home.    Relevant past medical, surgical, family and social history reviewed and updated as indicated. Interim medical history since our last visit reviewed. Allergies and medications reviewed and updated.  Review of Systems  HENT:  Positive for congestion, ear pain, hoarse voice and trouble swallowing. Negative for ear discharge.   Respiratory:  Positive for cough and shortness of breath. Negative for stridor.   Gastrointestinal:  Negative for diarrhea.  Neurological:  Positive for headaches.    Per HPI unless specifically indicated above     Objective:    BP 117/78   Pulse 91   Temp 98.4 F (36.9 C) (Oral)   Ht 5' 2.01" (1.575 m)   Wt 158 lb 3.2 oz (71.8 kg)   SpO2 97%   BMI 28.93 kg/m   Wt Readings from Last 3 Encounters:  09/30/22 158 lb 3.2 oz (71.8 kg)  07/17/22 160 lb (72.6 kg)  07/08/22 158 lb 6.4 oz (71.8 kg)    Physical Exam Constitutional:      General: She is not in acute distress.    Appearance: Normal appearance. She is  well-developed.  HENT:     Head: Normocephalic and atraumatic.     Right Ear: Tympanic membrane and ear canal normal.     Left Ear: Tympanic membrane and ear canal normal.     Nose: No rhinorrhea.     Right Sinus: Maxillary sinus tenderness present. No frontal sinus tenderness.     Left Sinus: Maxillary sinus tenderness present. No frontal sinus tenderness.  Eyes:     General: Lids are normal. No scleral icterus.       Right eye: No discharge.        Left eye: No discharge.     Conjunctiva/sclera: Conjunctivae normal.  Cardiovascular:     Rate and Rhythm: Normal rate and regular rhythm.  Pulmonary:     Effort: Pulmonary effort is normal. No respiratory distress.     Breath sounds: Normal breath sounds.  Abdominal:     Palpations: There is no hepatomegaly or splenomegaly.  Musculoskeletal:        General: Normal range of motion.  Skin:    Coloration: Skin is not pale.     Findings: No rash.  Neurological:     Mental Status: She is alert and oriented to person, place, and time.  Psychiatric:        Behavior: Behavior  normal.        Thought Content: Thought content normal.        Judgment: Judgment normal.     Results for orders placed or performed during the hospital encounter of 07/17/22  Resp panel by RT-PCR (RSV, Flu A&B, Covid) Anterior Nasal Swab   Specimen: Anterior Nasal Swab  Result Value Ref Range   SARS Coronavirus 2 by RT PCR POSITIVE (A) NEGATIVE   Influenza A by PCR NEGATIVE NEGATIVE   Influenza B by PCR NEGATIVE NEGATIVE   Resp Syncytial Virus by PCR NEGATIVE NEGATIVE      Assessment & Plan:   Problem List Items Addressed This Visit   None Visit Diagnoses     Acute non-recurrent maxillary sinusitis    -  Primary   Acute sinusitis.  Rx for Augmentin for 10 days.  Denies pregnancy. Note given for work.  Netti pot.  saline asal spray   Relevant Medications   amoxicillin-clavulanate (AUGMENTIN) 875-125 MG tablet        Follow up plan: Return if  symptoms worsen or fail to improve.

## 2022-09-30 NOTE — Telephone Encounter (Signed)
5/23 Reason for Disposition  SEVERE (e.g., excruciating) throat pain  Answer Assessment - Initial Assessment Questions 1. ONSET: "When did the throat start hurting?" (Hours or days ago)      3/23 2. SEVERITY: "How bad is the sore throat?" (Scale 1-10; mild, moderate or severe)   - MILD (1-3):  Doesn't interfere with eating or normal activities.   - MODERATE (4-7): Interferes with eating some solids and normal activities.   - SEVERE (8-10):  Excruciating pain, interferes with most normal activities.   - SEVERE WITH DYSPHAGIA (10): Can't swallow liquids, drooling.     moderate 3. STREP EXPOSURE: "Has there been any exposure to strep within the past week?" If Yes, ask: "What type of contact occurred?"      no 4.  VIRAL SYMPTOMS: "Are there any symptoms of a cold, such as a runny nose, cough, hoarse voice or red eyes?"      Cough, left nodes swollen 5. FEVER: "Do you have a fever?" If Yes, ask: "What is your temperature, how was it measured, and when did it start?"     Last week- went away 6. PUS ON THE TONSILS: "Is there pus on the tonsils in the back of your throat?"     Yes-left 7. OTHER SYMPTOMS: "Do you have any other symptoms?" (e.g., difficulty breathing, headache, rash)     Cough, nasal congestion  Protocols used: Sore Throat-A-AH

## 2022-10-01 ENCOUNTER — Ambulatory Visit: Payer: 59 | Admitting: Nurse Practitioner

## 2022-10-01 ENCOUNTER — Encounter: Payer: Self-pay | Admitting: Nurse Practitioner

## 2022-10-01 VITALS — BP 106/72 | HR 67 | Temp 98.0°F | Ht 62.01 in | Wt 158.3 lb

## 2022-10-01 DIAGNOSIS — N200 Calculus of kidney: Secondary | ICD-10-CM

## 2022-10-01 NOTE — Assessment & Plan Note (Signed)
Seen in January for kidney stone.  Symptoms have resolved.  If the symptoms reoccur can refer back to urology.  Follow up if symptoms return.

## 2022-10-01 NOTE — Progress Notes (Signed)
BP 106/72   Pulse 67   Temp 98 F (36.7 C) (Oral)   Ht 5' 2.01" (1.575 m)   Wt 158 lb 4.8 oz (71.8 kg)   SpO2 98%   BMI 28.95 kg/m    Subjective:    Patient ID: Misty Peters, female    DOB: 08-Sep-1995, 27 y.o.   MRN: [id number]  HPI: Misty Peters is a 27 y.o. female  Chief Complaint  Patient presents with   Nephrolithiasis    Here for follow up   Patient states she was seen in January for kidney stones.  She was told to see Urology but they cancelled her appt.  States she took the flomax and wasn't having any pain.  States that after she stopped the flomax she occasionally has some pain but doesn't feel like she has another kidney stone.  Denies other symptoms have visit today.    Relevant past medical, surgical, family and social history reviewed and updated as indicated. Interim medical history since our last visit reviewed. Allergies and medications reviewed and updated.  Review of Systems  Genitourinary:  Positive for flank pain.    Per HPI unless specifically indicated above     Objective:    BP 106/72   Pulse 67   Temp 98 F (36.7 C) (Oral)   Ht 5' 2.01" (1.575 m)   Wt 158 lb 4.8 oz (71.8 kg)   SpO2 98%   BMI 28.95 kg/m   Wt Readings from Last 3 Encounters:  10/01/22 158 lb 4.8 oz (71.8 kg)  09/30/22 158 lb 3.2 oz (71.8 kg)  07/17/22 160 lb (72.6 kg)    Physical Exam Vitals and nursing note reviewed.  Constitutional:      General: She is not in acute distress.    Appearance: Normal appearance. She is normal weight. She is not ill-appearing, toxic-appearing or diaphoretic.  HENT:     Head: Normocephalic.     Right Ear: External ear normal.     Left Ear: External ear normal.     Nose: Nose normal.     Mouth/Throat:     Mouth: Mucous membranes are moist.     Pharynx: Oropharynx is clear.  Eyes:     General:        Right eye: No discharge.        Left eye: No discharge.     Extraocular Movements: Extraocular movements intact.      Conjunctiva/sclera: Conjunctivae normal.     Pupils: Pupils are equal, round, and reactive to light.  Cardiovascular:     Rate and Rhythm: Normal rate and regular rhythm.     Heart sounds: No murmur heard. Pulmonary:     Effort: Pulmonary effort is normal. No respiratory distress.     Breath sounds: Normal breath sounds. No wheezing or rales.  Musculoskeletal:     Cervical back: Normal range of motion and neck supple.  Skin:    General: Skin is warm and dry.     Capillary Refill: Capillary refill takes less than 2 seconds.  Neurological:     General: No focal deficit present.     Mental Status: She is alert and oriented to person, place, and time. Mental status is at baseline.  Psychiatric:        Mood and Affect: Mood normal.        Behavior: Behavior normal.        Thought Content: Thought content normal.  Judgment: Judgment normal.     Results for orders placed or performed during the hospital encounter of 07/17/22  Resp panel by RT-PCR (RSV, Flu A&B, Covid) Anterior Nasal Swab   Specimen: Anterior Nasal Swab  Result Value Ref Range   SARS Coronavirus 2 by RT PCR POSITIVE (A) NEGATIVE   Influenza A by PCR NEGATIVE NEGATIVE   Influenza B by PCR NEGATIVE NEGATIVE   Resp Syncytial Virus by PCR NEGATIVE NEGATIVE      Assessment & Plan:   Problem List Items Addressed This Visit       Genitourinary   Kidney stone - Primary    Seen in January for kidney stone.  Symptoms have resolved.  If the symptoms reoccur can refer back to urology.  Follow up if symptoms return.        Follow up plan: No follow-ups on file.

## 2022-10-14 ENCOUNTER — Ambulatory Visit: Payer: BC Managed Care – PPO | Admitting: Urology

## 2022-10-15 ENCOUNTER — Ambulatory Visit: Payer: 59 | Admitting: Urology

## 2022-10-15 ENCOUNTER — Encounter: Payer: Self-pay | Admitting: Urology

## 2022-10-15 VITALS — BP 116/79 | HR 96 | Ht 62.0 in | Wt 157.0 lb

## 2022-10-15 DIAGNOSIS — R102 Pelvic and perineal pain: Secondary | ICD-10-CM | POA: Diagnosis not present

## 2022-10-15 DIAGNOSIS — N200 Calculus of kidney: Secondary | ICD-10-CM

## 2022-10-15 LAB — MICROSCOPIC EXAMINATION

## 2022-10-15 LAB — URINALYSIS, COMPLETE
Bilirubin, UA: NEGATIVE
Glucose, UA: NEGATIVE
Ketones, UA: NEGATIVE
Leukocytes,UA: NEGATIVE
Nitrite, UA: NEGATIVE
Protein,UA: NEGATIVE
RBC, UA: NEGATIVE
Specific Gravity, UA: 1.02 (ref 1.005–1.030)
Urobilinogen, Ur: 0.2 mg/dL (ref 0.2–1.0)
pH, UA: 7 (ref 5.0–7.5)

## 2022-10-15 NOTE — Progress Notes (Signed)
10/15/22 1:36 PM   Misty Peters October 18, 1995 [id number]  CC: Microscopic hematuria, pelvic pain  HPI: 27 year old female referred for the above issues.  She had an episode of severe bilateral lower pelvic pain in October 2023 and was evaluated in the ER.  She had just finished her menstrual cycle at that time and did have some microscopic hematuria, but CT was completely benign, no hydronephrosis or stones.  She also had a pelvic ultrasound that was normal.  She did not have any urinary symptoms at that time.  She had a fair amount of narcotics and does not remember much about that hospitalization.  She really has done well since that time and denies any further severe episodes of flank pain.  She has had an occasional every few months rare pelvic discomfort but nothing similar to prior.  She denies any urinary symptoms or gross hematuria.  She had a few UAs with microscopic hematuria but these were all around the time of her menstrual cycle.  Urinalysis today is completely benign   PMH: Past Medical History:  Diagnosis Date   Anxiety    Bipolar disorder with depression 11/07/2015   Depression    Headache     Surgical History: Past Surgical History:  Procedure Laterality Date   GUM SURGERY     SALIVARY STONE REMOVAL     WISDOM TOOTH EXTRACTION       Family History: Family History  Problem Relation Age of Onset   Hyperlipidemia Father    Hypertension Father    Diabetes Father    Heart disease Paternal Grandmother    Stroke Paternal Grandmother    Cancer Paternal Grandmother        lung and breast   Bipolar disorder Paternal Grandmother    ADD / ADHD Paternal Grandmother    Cancer Maternal Grandmother        breast   Bipolar disorder Paternal Aunt    Drug abuse Other    COPD Neg Hx     Social History:  reports that she has never smoked. She has never been exposed to tobacco smoke. She has never used smokeless tobacco. She reports that she does not drink alcohol  and does not use drugs.  Physical Exam: BP 116/79   Pulse 96   Ht  (1.575 m)   Wt 157 lb (71.2 kg)   BMI 28.72 kg/m    Constitutional:  Alert and oriented, No acute distress. Cardiovascular: No clubbing, cyanosis, or edema. Respiratory: Normal respiratory effort, no increased work of breathing. GI: Abdomen is soft, nontender, nondistended, no abdominal masses   Laboratory Data: Reviewed, urinalysis today completely benign  Pertinent Imaging: I have personally viewed and interpreted the CT scan from October 2023 showing no hydronephrosis or stones, normal-appearing bladder.  Assessment & Plan:   27 year old female with episode of severe lower pelvic pain in October 2023 of unclear etiology that resolved.  She really has done well since that time and denies any urinary symptoms.  She has had a few urine samples around the time of her menstrual cycle that have shown microscopic hematuria, but urinalysis today is completely benign.  No indication for cystoscopy.  Very low suspicion for nephrolithiasis based on the normal CT and her original symptoms.  We discussed other possible causes like pelvic floor dysfunction, and extensive patient information was provided.  Follow-up with urology as needed.   Legrand Rams, MD 10/15/2022  [facility] [address], [address] [place], [place]  [address] [phone]   

## 2022-10-15 NOTE — Patient Instructions (Signed)
Pelvic Floor Dysfunction, Female  Pelvic floor dysfunction (PFD) is a condition that results when the group of muscles and connective tissues that support the organs in the pelvis (pelvic floor muscles) do not work well. These muscles and their connections form a sling that supports the colon and bladder. In women, they also support the uterus. PFD causes pelvic floor muscles to be too weak, too tight, or both. In PFD, muscle movements are not coordinated. This may cause bowel or bladder problems. It may also cause pain. What are the causes? This condition may be caused by an injury to the pelvic area or by a weakening of pelvic muscles. This often results from pregnancy and childbirth or other types of strain. In many cases, the exact cause is not known. What increases the risk? The following factors may make you more likely to develop this condition: Having chronic bladder tissue inflammation (interstitial cystitis). Being an older person. Being overweight. History of radiation treatment for cancer in the pelvic region. Previous pelvic surgery, such as removal of the uterus (hysterectomy). What are the signs or symptoms? Symptoms of this condition vary and may include: Bladder symptoms, such as: Trouble starting urination and emptying the bladder. Frequent urinary tract infections. Leaking urine when coughing, laughing, or exercising (stress incontinence). Having to pass urine urgently or frequently. Pain when passing urine. Bowel symptoms, such as: Constipation. Urgent or frequent bowel movements. Incomplete bowel movements. Painful bowel movements. Leaking stool or gas. Unexplained genital or rectal pain. Genital or rectal muscle spasms. Low back pain. Other symptoms may include: A heavy, full, or aching feeling in the vagina. A bulge that protrudes into the vagina. Pain during or after sex. How is this diagnosed? This condition may be diagnosed based on: Your symptoms and  medical history. A physical exam. During the exam, your health care provider may check your pelvic muscles for tightness, spasm, pain, or weakness. This may include a rectal exam and a pelvic exam. In some cases, you may have diagnostic tests, such as: Electrical muscle function tests. Urine flow testing. X-ray tests of bowel function. Ultrasound of the pelvic organs. How is this treated? Treatment for this condition depends on the symptoms. Treatment options include: Physical therapy. This may include Kegel exercises to help relax or strengthen the pelvic floor muscles. Biofeedback. This type of therapy provides feedback on how tight your pelvic floor muscles are so that you can learn to control them. Internal or external massage therapy. A treatment that involves electrical stimulation of the pelvic floor muscles to help control pain (transcutaneous electrical nerve stimulation, or TENS). Sound wave therapy (ultrasound) to reduce muscle spasms. Medicines, such as: Muscle relaxants. Bladder control medicines. Surgery to reconstruct or support pelvic floor muscles may be an option if other treatments do not help. Follow these instructions at home: Activity Do your usual activities as told by your health care provider. Ask your health care provider if you should modify any activities. Do pelvic floor strengthening or relaxing exercises at home as told by your physical therapist. Lifestyle Maintain a healthy weight. Eat foods that are high in fiber, such as beans, whole grains, and fresh fruits and vegetables. Limit foods that are high in fat and processed sugars, such as fried or sweet foods. Manage stress with relaxation techniques such as yoga or meditation. General instructions If you have problems with leakage: Use absorbable pads or wear padded underwear. Wash frequently with mild soap. Keep your genital and anal area as clean and dry as   possible. Ask your health care provider if  you should try a barrier cream to prevent skin irritation. Take warm baths to relieve pelvic muscle tension or spasms. Take over-the-counter and prescription medicines only as told by your health care provider. Keep all follow-up visits. How is this prevented? The cause of PFD is not always known, but there are a few things you can do to reduce the risk of developing this condition, including: Staying at a healthy weight. Getting regular exercise. Managing stress. Contact a health care provider if: Your symptoms are not improving with home care. You have signs or symptoms of PFD that get worse at home. You develop new signs or symptoms. You have signs of a urinary tract infection, such as: Fever. Chills. Increased urinary frequency. A burning feeling when urinating. You have not had a bowel movement in 3 days (constipation). Summary Pelvic floor dysfunction results when the muscles and connective tissues in your pelvic floor do not work well. These muscles and their connections form a sling that supports your colon and bladder. In women, they also support the uterus. PFD may be caused by an injury to the pelvic area or by a weakening of pelvic muscles. PFD causes pelvic floor muscles to be too weak, too tight, or a combination of both. Symptoms may vary from person to person. In most cases, PFD can be treated with physical therapies and medicines. Surgery may be an option if other treatments do not help. This information is not intended to replace advice given to you by your health care provider. Make sure you discuss any questions you have with your health care provider. Document Revised: 10/16/2020 Document Reviewed: 10/16/2020 [employer]vier Patient Education  2023 [employer]    

## 2022-12-10 ENCOUNTER — Other Ambulatory Visit (HOSPITAL_COMMUNITY)
Admission: RE | Admit: 2022-12-10 | Discharge: 2022-12-10 | Disposition: A | Discharge status: Home / Self Care | Payer: 59 | Source: Ambulatory Visit | Attending: Physician Assistant | Admitting: Physician Assistant

## 2022-12-10 ENCOUNTER — Ambulatory Visit: Payer: 59 | Admitting: Physician Assistant

## 2022-12-10 ENCOUNTER — Encounter: Payer: Self-pay | Admitting: Physician Assistant

## 2022-12-10 VITALS — BP 98/66 | HR 76 | Temp 98.3°F | Ht 62.1 in | Wt 160.2 lb

## 2022-12-10 DIAGNOSIS — Z0000 Encounter for general adult medical examination without abnormal findings: Secondary | ICD-10-CM | POA: Insufficient documentation

## 2022-12-10 NOTE — Progress Notes (Deleted)
          Acute Office Visit   Patient: Misty Peters   DOB: 1995/09/20   27 y.o. Female  MRN: [id number] Visit Date: 12/10/2022  Today's healthcare provider: Oswaldo Conroy Kleber Crean, PA-C  Introduced myself to the patient as a Secondary school teacher and provided education on APPs in clinical practice.    No chief complaint on file.  Subjective    HPI    Medications: Outpatient Medications Prior to Visit  Medication Sig   cyclobenzaprine (FLEXERIL) 10 MG tablet Take 1 tablet (10 mg total) by mouth 3 (three) times daily as needed for muscle spasms.   No facility-administered medications prior to visit.    Review of Systems  {Labs  Heme  Chem  Endocrine  Serology  Results Review (optional):23779}   Objective    There were no vitals taken for this visit. {Show previous vital signs (optional):23777}  Physical Exam    No results found for any visits on 12/10/22.  Assessment & Plan      No follow-ups on file.
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
## 2022-12-11 LAB — CYTOLOGY - PAP
Chlamydia: NEGATIVE
Comment: NEGATIVE
Comment: NORMAL
Diagnosis: NEGATIVE
Neisseria Gonorrhea: NEGATIVE

## 2022-12-14 NOTE — Progress Notes (Signed)
Your labs are back. Your cholesterol looks pretty good- your triglycerides were a bit elevated but are better than previous results. These can be elevated if you were not fasting prior to the testing so I recommend we repeat this next year for monitoring Your CBC was overall normal and stable  Electrolytes, liver and kidney function were in normal ranges Your A1c was 5.3 which is in normal range Your HIV and syphilis screening was negative Your Pap smear was normal- no evidence of malignancy or concerning lesions. It was also negative for gonorrhea and chlamydia. You can repeat your pap in 3 years for routine cervical cancer screening - or sooner if concerns arise.  We are still waiting on the TB test results- we will keep you updated once those return. Please let us know if you have further questions or concerns.

## 2022-12-17 LAB — RPR W/REFLEX TO TREPSURE: RPR: NONREACTIVE

## 2022-12-17 LAB — QUANTIFERON-TB GOLD PLUS
QuantiFERON Mitogen Value: >10 IU/mL
QuantiFERON Nil Value: 0.07 IU/mL
QuantiFERON TB1 Ag Value: 0.12 IU/mL
QuantiFERON TB2 Ag Value: 0.1 IU/mL
QuantiFERON-TB Gold Plus: NEGATIVE

## 2022-12-17 LAB — CBC WITH DIFFERENTIAL/PLATELET
Basophils Absolute: 0 10*3/uL (ref 0.0–0.2)
Basos: 0 %
EOS (ABSOLUTE): 0.1 10*3/uL (ref 0.0–0.4)
Eos: 1 %
Hematocrit: 39.3 % (ref 34.0–46.6)
Hemoglobin: 12.8 g/dL (ref 11.1–15.9)
Immature Grans (Abs): 0 10*3/uL (ref 0.0–0.1)
Immature Granulocytes: 0 %
Lymphocytes Absolute: 3.2 10*3/uL — ABNORMAL HIGH (ref 0.7–3.1)
Lymphs: 32 %
MCH: 28.8 pg (ref 26.6–33.0)
MCHC: 32.6 g/dL (ref 31.5–35.7)
MCV: 88 fL (ref 79–97)
Monocytes Absolute: 0.8 10*3/uL (ref 0.1–0.9)
Monocytes: 7 %
Neutrophils Absolute: 5.9 10*3/uL (ref 1.4–7.0)
Neutrophils: 60 %
Platelets: 338 10*3/uL (ref 150–450)
RBC: 4.45 x10E6/uL (ref 3.77–5.28)
RDW: 13 % (ref 11.7–15.4)
WBC: 10.1 10*3/uL (ref 3.4–10.8)

## 2022-12-17 LAB — TREPONEMAL ANTIBODIES, TPPA: Treponemal Antibodies, TPPA: NONREACTIVE

## 2022-12-17 LAB — COMPREHENSIVE METABOLIC PANEL
ALT: 22 IU/L (ref 0–32)
AST: 25 IU/L (ref 0–40)
Albumin: 4.3 g/dL (ref 4.0–5.0)
Alkaline Phosphatase: 57 IU/L (ref 44–121)
BUN/Creatinine Ratio: 20 (ref 9–23)
BUN: 16 mg/dL (ref 6–20)
Bilirubin Total: 0.3 mg/dL (ref 0.0–1.2)
CO2: 23 mmol/L (ref 20–29)
Calcium: 9 mg/dL (ref 8.7–10.2)
Chloride: 104 mmol/L (ref 96–106)
Creatinine, Ser: 0.81 mg/dL (ref 0.57–1.00)
Globulin, Total: 2.6 g/dL (ref 1.5–4.5)
Glucose: 74 mg/dL (ref 70–99)
Potassium: 4.1 mmol/L (ref 3.5–5.2)
Sodium: 140 mmol/L (ref 134–144)
Total Protein: 6.9 g/dL (ref 6.0–8.5)
eGFR: 102 mL/min/{1.73_m2} (ref >59)

## 2022-12-17 LAB — LIPID PANEL
Chol/HDL Ratio: 4.4 ratio (ref 0.0–4.4)
Cholesterol, Total: 170 mg/dL (ref 100–199)
HDL: 39 mg/dL — ABNORMAL LOW (ref >39)
LDL Chol Calc (NIH): 93 mg/dL (ref 0–99)
Triglycerides: 222 mg/dL — ABNORMAL HIGH (ref 0–149)
VLDL Cholesterol Cal: 38 mg/dL (ref 5–40)

## 2022-12-17 LAB — HEMOGLOBIN A1C
Est. average glucose Bld gHb Est-mCnc: 105 mg/dL
Hgb A1c MFr Bld: 5.3 % (ref 4.8–5.6)

## 2022-12-17 LAB — HIV ANTIBODY (ROUTINE TESTING W REFLEX): HIV Screen 4th Generation wRfx: NONREACTIVE

## 2022-12-20 ENCOUNTER — Encounter: Payer: Self-pay | Admitting: Physician Assistant

## 2022-12-21 NOTE — Progress Notes (Signed)
Your Quantiferon gold (Tb) testing has returned and was negative.

## 2023-02-27 ENCOUNTER — Encounter: Payer: Self-pay | Admitting: Emergency Medicine

## 2023-02-27 ENCOUNTER — Ambulatory Visit
Admission: EM | Admit: 2023-02-27 | Discharge: 2023-02-27 | Disposition: A | Discharge status: Home / Self Care | Payer: 59 | Attending: Emergency Medicine | Admitting: Emergency Medicine

## 2023-02-27 DIAGNOSIS — Z1152 Encounter for screening for COVID-19: Secondary | ICD-10-CM | POA: Diagnosis not present

## 2023-02-27 DIAGNOSIS — B9789 Other viral agents as the cause of diseases classified elsewhere: Secondary | ICD-10-CM | POA: Insufficient documentation

## 2023-02-27 DIAGNOSIS — Z20822 Contact with and (suspected) exposure to covid-19: Secondary | ICD-10-CM | POA: Insufficient documentation

## 2023-02-27 DIAGNOSIS — J069 Acute upper respiratory infection, unspecified: Secondary | ICD-10-CM | POA: Diagnosis not present

## 2023-02-27 DIAGNOSIS — R059 Cough, unspecified: Secondary | ICD-10-CM | POA: Diagnosis present

## 2023-02-27 LAB — SARS CORONAVIRUS 2 BY RT PCR: SARS Coronavirus 2 by RT PCR: NEGATIVE

## 2023-02-27 MED ORDER — IPRATROPIUM BROMIDE 0.06 % NA SOLN: 2.0000 | Freq: Four times a day (QID) | NASAL | 12 refills | Status: DC

## 2023-02-27 MED ORDER — BENZONATATE 100 MG PO CAPS: 200.0000 mg | ORAL_CAPSULE | Freq: Three times a day (TID) | ORAL | 0 refills | Status: DC

## 2023-02-27 MED ORDER — PROMETHAZINE-DM 6.25-15 MG/5ML PO SYRP: 5.0000 mL | ORAL_SOLUTION | Freq: Four times a day (QID) | ORAL | 0 refills | Status: DC | PRN

## 2023-02-27 NOTE — Discharge Instructions (Addendum)
Your COVID test today was negative.  I do believe you have a viral respiratory infection which is causing your symptoms.  Use over-the-counter Tylenol and/or ibuprofen according to package instructions as needed for fever or pain.  Use the Atrovent nasal spray, 2 squirts in each nostril every 6 hours, as needed for runny nose and postnasal drip.  Use the Tessalon Perles every 8 hours during the day.  Take them with a small sip of water.  They may give you some numbness to the base of your tongue or a metallic taste in your mouth, this is normal.  Use the Promethazine DM cough syrup at bedtime for cough and congestion.  It will make you drowsy so do not take it during the day.  Return for reevaluation or see your primary care provider for any new or worsening symptoms.

## 2023-02-27 NOTE — ED Triage Notes (Signed)
Patient c/o cough, nasal congestion, headache and fever for 2-3 days.  Patient states that her dad currently has COVID.

## 2023-02-27 NOTE — ED Provider Notes (Signed)
[facility]    CSN: [id number] Arrival date & time: 02/27/23  1326      History   Chief Complaint Chief Complaint  Patient presents with   Covid Exposure   Cough    HPI Misty Peters is a 27 y.o. female.   HPI  27 year old female with a past medical history significant for bipolar disorder, headache, and kidney stones presents for evaluation of respiratory symptoms in the setting of a recent exposure to COVID.  Her dad is currently being treated for COVID.  Her symptoms began 2 to 3 days ago and they consist of a fever with a Tmax of 101.7, headache, nasal congestion with nasal discharge that has occasionally been bloody, sore throat, productive cough, and diarrhea.  No shortness of breath or wheezing.  No nausea or vomiting.  Past Medical History:  Diagnosis Date   Anxiety    Bipolar disorder with depression (HCC) 11/07/2015   Depression    Headache     Patient Active Problem List   Diagnosis Date Noted   History of suicide attempt 07/08/2022   Kidney stone 07/08/2022   IFG (impaired fasting glucose) 07/08/2022   Gastroenteritis 07/08/2022   Bipolar disorder, in full remission, most recent episode depressed (HCC) 08/07/2020   High risk medication use 11/17/2019   MDD (major depressive disorder) 08/13/2019   PTSD (post-traumatic stress disorder) 08/13/2019   Migraine 03/13/2018   Suicidal ideations 02/08/2018   Encounter for BCP (birth control pills) initial prescription 11/15/2016   Anxiety     Past Surgical History:  Procedure Laterality Date   GUM SURGERY     SALIVARY STONE REMOVAL     WISDOM TOOTH EXTRACTION      OB History   No obstetric history on file.      Home Medications    Prior to Admission medications   Medication Sig Start Date End Date Taking? Authorizing Provider  benzonatate (TESSALON) 100 MG capsule Take 2 capsules (200 mg total) by mouth every 8 (eight) hours. 02/27/23  Yes Becky Augusta, NP  ipratropium (ATROVENT) 0.06 %  nasal spray Place 2 sprays into both nostrils 4 (four) times daily. 02/27/23  Yes Becky Augusta, NP  promethazine-dextromethorphan (PROMETHAZINE-DM) 6.25-15 MG/5ML syrup Take 5 mLs by mouth 4 (four) times daily as needed. 02/27/23  Yes Becky Augusta, NP    Family History Family History  Problem Relation Age of Onset   Hyperlipidemia Father    Hypertension Father    Diabetes Father    Heart disease Paternal Grandmother    Stroke Paternal Grandmother    Cancer Paternal Grandmother        lung and breast   Bipolar disorder Paternal Grandmother    ADD / ADHD Paternal Grandmother    Cancer Maternal Grandmother        breast   Bipolar disorder Paternal Aunt    Drug abuse Other    COPD Neg Hx     Social History Social History   Tobacco Use   Smoking status: Never    Passive exposure: Never   Smokeless tobacco: Never  Vaping Use   Vaping status: Never Used  Substance Use Topics   Alcohol use: No    Alcohol/week: 0.0 standard drinks of alcohol   Drug use: No     Allergies   Patient has no known allergies.   Review of Systems Review of Systems  Constitutional:  Positive for fever.  HENT:  Positive for congestion, rhinorrhea and sore throat. Negative for  ear pain.   Respiratory:  Positive for cough. Negative for shortness of breath and wheezing.   Gastrointestinal:  Positive for diarrhea. Negative for nausea and vomiting.  Neurological:  Positive for headaches.     Physical Exam Triage Vital Signs ED Triage Vitals  Encounter Vitals Group     BP      Systolic BP Percentile      Diastolic BP Percentile      Pulse      Resp      Temp      Temp src      SpO2      Weight      Height      Head Circumference      Peak Flow      Pain Score      Pain Loc      Pain Education      Exclude from Growth Chart    No data found.  Updated Vital Signs BP 110/78 (BP Location: Right Arm)   Pulse (!) 102   Temp 98.5 F (36.9 C) (Oral)   Resp 14   Ht 5\' 2"  (1.575 m)   Wt  160 lb 4.4 oz (72.7 kg)   LMP 02/20/2023 (Approximate)   SpO2 96%   BMI 29.31 kg/m   Visual Acuity Right Eye Distance:   Left Eye Distance:   Bilateral Distance:    Right Eye Near:   Left Eye Near:    Bilateral Near:     Physical Exam Vitals and nursing note reviewed.  Constitutional:      Appearance: Normal appearance. She is not ill-appearing.  HENT:     Head: Normocephalic and atraumatic.     Right Ear: Tympanic membrane, ear canal and external ear normal. There is no impacted cerumen.     Left Ear: Tympanic membrane, ear canal and external ear normal. There is no impacted cerumen.     Nose: Congestion and rhinorrhea present.     Comments: Nasal mucosa is erythematous and edematous with clear discharge in both nares.    Mouth/Throat:     Mouth: Mucous membranes are moist.     Pharynx: Oropharynx is clear. Posterior oropharyngeal erythema present. No oropharyngeal exudate.     Comments: Tonsillar pillars are benign.  Posterior pharynx does demonstrate erythema with clear postnasal drip. Cardiovascular:     Rate and Rhythm: Normal rate and regular rhythm.     Pulses: Normal pulses.     Heart sounds: Normal heart sounds. No murmur heard.    No friction rub. No gallop.  Pulmonary:     Effort: Pulmonary effort is normal.     Breath sounds: Normal breath sounds. No wheezing, rhonchi or rales.  Musculoskeletal:     Cervical back: Normal range of motion and neck supple. No tenderness.  Lymphadenopathy:     Cervical: No cervical adenopathy.  Skin:    General: Skin is warm and dry.     Capillary Refill: Capillary refill takes less than 2 seconds.     Findings: No rash.  Neurological:     General: No focal deficit present.     Mental Status: She is alert and oriented to person, place, and time.      UC Treatments / Results  Labs (all labs ordered are listed, but only abnormal results are displayed) Labs Reviewed  SARS CORONAVIRUS 2 BY RT PCR     EKG   Radiology No results found.  Procedures Procedures (including critical care time)  Medications Ordered in UC Medications - No data to display  Initial Impression / Assessment and Plan / UC Course  I have reviewed the triage vital signs and the nursing notes.  Pertinent labs & imaging results that were available during my care of the patient were reviewed by me and considered in my medical decision making (see chart for details).   Patient is a nontoxic-appearing 27 year old female presenting for evaluation of respiratory symptoms in the setting of a recent COVID exposure.  She is not in any acute distress and she is able to speak in full sentence without dyspnea or tachypnea.  She has been febrile at home but currently has an oral temp of 98.5.  Mild tachycardia with a heart rate of 102.  Respiratory rate is 14 with a 96% room oxygen saturation.  Given her exposure I will order a COVID PCR.  COVID PCR is negative.  I will discharge patient home with diagnosis of viral URI with a cough and prescribe Atrovent nasal rate of the nasal congestion, Tessalon Perles, Promethazine DM cough syrup.  Over-the-counter Tylenol and/or ibuprofen as needed for fever or pain.  Return precautions reviewed.   Final Clinical Impressions(s) / UC Diagnoses   Final diagnoses:  Viral URI with cough     Discharge Instructions      Your COVID test today was negative.  I do believe you have a viral respiratory infection which is causing your symptoms.  Use over-the-counter Tylenol and/or ibuprofen according to package instructions as needed for fever or pain.  Use the Atrovent nasal spray, 2 squirts in each nostril every 6 hours, as needed for runny nose and postnasal drip.  Use the Tessalon Perles every 8 hours during the day.  Take them with a small sip of water.  They may give you some numbness to the base of your tongue or a metallic taste in your mouth, this is normal.  Use the  Promethazine DM cough syrup at bedtime for cough and congestion.  It will make you drowsy so do not take it during the day.  Return for reevaluation or see your primary care provider for any new or worsening symptoms.      ED Prescriptions     Medication Sig Dispense Auth. Provider   benzonatate (TESSALON) 100 MG capsule Take 2 capsules (200 mg total) by mouth every 8 (eight) hours. 21 capsule Becky Augusta, NP   ipratropium (ATROVENT) 0.06 % nasal spray Place 2 sprays into both nostrils 4 (four) times daily. 15 mL Becky Augusta, NP   promethazine-dextromethorphan (PROMETHAZINE-DM) 6.25-15 MG/5ML syrup Take 5 mLs by mouth 4 (four) times daily as needed. 118 mL Becky Augusta, NP      PDMP not reviewed this encounter.   Becky Augusta, NP 02/27/23 1426

## 2023-07-31 ENCOUNTER — Ambulatory Visit
Admission: EM | Admit: 2023-07-31 | Discharge: 2023-07-31 | Disposition: A | Discharge status: Home / Self Care | Payer: Self-pay | Attending: Family Medicine | Admitting: Family Medicine

## 2023-07-31 DIAGNOSIS — J069 Acute upper respiratory infection, unspecified: Secondary | ICD-10-CM

## 2023-07-31 MED ORDER — IPRATROPIUM BROMIDE 0.06 % NA SOLN: 2.0000 | Freq: Four times a day (QID) | NASAL | 12 refills | Status: DC

## 2023-07-31 MED ORDER — PROMETHAZINE-DM 6.25-15 MG/5ML PO SYRP: 5.0000 mL | ORAL_SOLUTION | Freq: Four times a day (QID) | ORAL | 0 refills | Status: DC | PRN

## 2023-07-31 MED ORDER — BENZONATATE 100 MG PO CAPS: 200.0000 mg | ORAL_CAPSULE | Freq: Three times a day (TID) | ORAL | 0 refills | Status: DC

## 2023-07-31 MED ORDER — AMOXICILLIN-POT CLAVULANATE 875-125 MG PO TABS: 1.0000 | ORAL_TABLET | Freq: Two times a day (BID) | ORAL | 0 refills | Status: AC

## 2023-07-31 NOTE — Discharge Instructions (Signed)
 Take the Augmentin  injury 25 mg twice daily with food for 7 days for treatment of your upper respiratory traction.  Use over-the-counter Tylenol  and/or ibuprofen  according the package instructions as needed for any fever or pain.  Use the Atrovent  nasal spray, 2 squirts in each nostril every 6 hours, as needed for runny nose and postnasal drip.  Use the Tessalon  Perles every 8 hours during the day.  Take them with a small sip of water.  They may give you some numbness to the base of your tongue or a metallic taste in your mouth, this is normal.  Use the Promethazine  DM cough syrup at bedtime for cough and congestion.  It will make you drowsy so do not take it during the day.  Return for reevaluation or see your primary care provider for any new or worsening symptoms.

## 2023-07-31 NOTE — ED Provider Notes (Signed)
 [facility]    CSN: [id number] Arrival date & time: 07/31/23  1538      History   Chief Complaint Chief Complaint  Patient presents with   Cough   Sore Throat    HPI Misty Peters is a 28 y.o. female.   HPI  28 year old female with past medical history significant for anxiety, bipolar, and depression presents for evaluation of runny nose, sore throat, and productive cough for green mucus that started 1 week ago.  She denies any fever, ear pain, shortness breath, or wheezing.  Past Medical History:  Diagnosis Date   Anxiety    Bipolar disorder with depression (HCC) 11/07/2015   Depression    Headache     Patient Active Problem List   Diagnosis Date Noted   History of suicide attempt 07/08/2022   Kidney stone 07/08/2022   IFG (impaired fasting glucose) 07/08/2022   Gastroenteritis 07/08/2022   Bipolar disorder, in full remission, most recent episode depressed (HCC) 08/07/2020   High risk medication use 11/17/2019   MDD (major depressive disorder) 08/13/2019   PTSD (post-traumatic stress disorder) 08/13/2019   Migraine 03/13/2018   Suicidal ideations 02/08/2018   Encounter for BCP (birth control pills) initial prescription 11/15/2016   Anxiety     Past Surgical History:  Procedure Laterality Date   GUM SURGERY     SALIVARY STONE REMOVAL     WISDOM TOOTH EXTRACTION      OB History   No obstetric history on file.      Home Medications    Prior to Admission medications   Medication Sig Start Date End Date Taking? Authorizing Provider  amoxicillin -clavulanate (AUGMENTIN ) 875-125 MG tablet Take 1 tablet by mouth every 12 (twelve) hours for 7 days. 07/31/23 08/07/23 Yes Bernardino Ditch, NP  benzonatate  (TESSALON ) 100 MG capsule Take 2 capsules (200 mg total) by mouth every 8 (eight) hours. 07/31/23   Bernardino Ditch, NP  ipratropium (ATROVENT ) 0.06 % nasal spray Place 2 sprays into both nostrils 4 (four) times daily. 07/31/23   Bernardino Ditch, NP   promethazine -dextromethorphan (PROMETHAZINE -DM) 6.25-15 MG/5ML syrup Take 5 mLs by mouth 4 (four) times daily as needed. 07/31/23   Bernardino Ditch, NP    Family History Family History  Problem Relation Age of Onset   Hyperlipidemia Father    Hypertension Father    Diabetes Father    Heart disease Paternal Grandmother    Stroke Paternal Grandmother    Cancer Paternal Grandmother        lung and breast   Bipolar disorder Paternal Grandmother    ADD / ADHD Paternal Grandmother    Cancer Maternal Grandmother        breast   Bipolar disorder Paternal Aunt    Drug abuse Other    COPD Neg Hx     Social History Social History   Tobacco Use   Smoking status: Never    Passive exposure: Never   Smokeless tobacco: Never  Vaping Use   Vaping status: Never Used  Substance Use Topics   Alcohol use: No    Alcohol/week: 0.0 standard drinks of alcohol   Drug use: No     Allergies   Patient has no known allergies.   Review of Systems Review of Systems  Constitutional:  Negative for fever.  HENT:  Positive for congestion, rhinorrhea and sore throat. Negative for ear pain.   Respiratory:  Positive for cough. Negative for shortness of breath and wheezing.      Physical  Exam Triage Vital Signs ED Triage Vitals [07/31/23 1545]  Encounter Vitals Group     BP      Systolic BP Percentile      Diastolic BP Percentile      Pulse      Resp      Temp      Temp src      SpO2      Weight      Height      Head Circumference      Peak Flow      Pain Score 4     Pain Loc      Pain Education      Exclude from Growth Chart    No data found.  Updated Vital Signs BP 127/84 (BP Location: Right Arm)   Pulse (!) 105   Temp 98.4 F (36.9 C) (Oral)   Resp 17   LMP 07/13/2023 (Exact Date)   SpO2 95%   Visual Acuity Right Eye Distance:   Left Eye Distance:   Bilateral Distance:    Right Eye Near:   Left Eye Near:    Bilateral Near:     Physical Exam Vitals and nursing note  reviewed.  Constitutional:      Appearance: Normal appearance. She is not ill-appearing.  HENT:     Head: Normocephalic and atraumatic.     Right Ear: Tympanic membrane, ear canal and external ear normal. There is no impacted cerumen.     Left Ear: Tympanic membrane, ear canal and external ear normal. There is no impacted cerumen.     Nose: Congestion and rhinorrhea present.     Comments: Nasal mucosa is erythematous and edematous with thick yellow discharge in both nares.    Mouth/Throat:     Mouth: Mucous membranes are moist.     Pharynx: Oropharynx is clear. No oropharyngeal exudate or posterior oropharyngeal erythema.  Cardiovascular:     Rate and Rhythm: Normal rate and regular rhythm.     Pulses: Normal pulses.     Heart sounds: Normal heart sounds. No murmur heard.    No friction rub. No gallop.  Pulmonary:     Effort: Pulmonary effort is normal.     Breath sounds: Normal breath sounds. No wheezing, rhonchi or rales.  Musculoskeletal:     Cervical back: Normal range of motion and neck supple.  Skin:    General: Skin is warm and dry.     Capillary Refill: Capillary refill takes less than 2 seconds.     Findings: No erythema or rash.  Neurological:     General: No focal deficit present.     Mental Status: She is alert and oriented to person, place, and time.      UC Treatments / Results  Labs (all labs ordered are listed, but only abnormal results are displayed) Labs Reviewed - No data to display  EKG   Radiology No results found.  Procedures Procedures (including critical care time)  Medications Ordered in UC Medications - No data to display  Initial Impression / Assessment and Plan / UC Course  I have reviewed the triage vital signs and the nursing notes.  Pertinent labs & imaging results that were available during my care of the patient were reviewed by me and considered in my medical decision making (see chart for details).   Patient is a  nontoxic-appearing 28 year old female presenting for evaluation of of respiratory symptoms as outlined HPI above.  Her physical exam reveals inflammation of  her upper respiratory tract with inflamed nasal mucosa and thick yellow discharge in both nares.  No tenderness to compression of frontal or maxillary sinuses.  Oropharyngeal exam is benign.  No cervical lymphadenopathy present exam.  Cardiopulmonary exam reveals clear lung sounds in all fields.  Patient exam is consistent with an upper respiratory infection and given the fact that she has been experiencing symptoms for over a week a trial of antibiotics is warranted.  I will discharge her home on Augmentin  875 twice daily for 7 days with food.  Also Atrovent  nasal spray for nasal congestion.  Tessalon  Perles and Promethazine  DM cough syrup for cough and congestion.  Return precautions reviewed.   Final Clinical Impressions(s) / UC Diagnoses   Final diagnoses:  URI with cough and congestion     Discharge Instructions      Take the Augmentin  injury 25 mg twice daily with food for 7 days for treatment of your upper respiratory traction.  Use over-the-counter Tylenol  and/or ibuprofen  according the package instructions as needed for any fever or pain.  Use the Atrovent  nasal spray, 2 squirts in each nostril every 6 hours, as needed for runny nose and postnasal drip.  Use the Tessalon  Perles every 8 hours during the day.  Take them with a small sip of water.  They may give you some numbness to the base of your tongue or a metallic taste in your mouth, this is normal.  Use the Promethazine  DM cough syrup at bedtime for cough and congestion.  It will make you drowsy so do not take it during the day.  Return for reevaluation or see your primary care provider for any new or worsening symptoms.      ED Prescriptions     Medication Sig Dispense Auth. Provider   benzonatate  (TESSALON ) 100 MG capsule Take 2 capsules (200 mg total) by mouth  every 8 (eight) hours. 21 capsule Bernardino Ditch, NP   ipratropium (ATROVENT ) 0.06 % nasal spray Place 2 sprays into both nostrils 4 (four) times daily. 15 mL Bernardino Ditch, NP   promethazine -dextromethorphan (PROMETHAZINE -DM) 6.25-15 MG/5ML syrup Take 5 mLs by mouth 4 (four) times daily as needed. 118 mL Bernardino Ditch, NP   amoxicillin -clavulanate (AUGMENTIN ) 875-125 MG tablet Take 1 tablet by mouth every 12 (twelve) hours for 7 days. 14 tablet Bernardino Ditch, NP      PDMP not reviewed this encounter.   Bernardino Ditch, NP 07/31/23 1556

## 2023-07-31 NOTE — ED Triage Notes (Signed)
 Sx x 1 week  Sore throat Productive cough- green mucus Runny nose No fever

## 2023-09-02 ENCOUNTER — Emergency Department

## 2023-09-02 ENCOUNTER — Encounter: Payer: Self-pay | Admitting: Emergency Medicine

## 2023-09-02 ENCOUNTER — Ambulatory Visit
Admission: EM | Admit: 2023-09-02 | Discharge: 2023-09-02 | Disposition: A | Discharge status: Home / Self Care | Source: Home / Self Care

## 2023-09-02 ENCOUNTER — Emergency Department
Admission: EM | Admit: 2023-09-02 | Discharge: 2023-09-02 | Disposition: A | Discharge status: Home / Self Care | Attending: Emergency Medicine | Admitting: Emergency Medicine

## 2023-09-02 ENCOUNTER — Other Ambulatory Visit: Payer: Self-pay

## 2023-09-02 DIAGNOSIS — R1032 Left lower quadrant pain: Secondary | ICD-10-CM

## 2023-09-02 LAB — URINALYSIS, W/ REFLEX TO CULTURE (INFECTION SUSPECTED)
Bilirubin Urine: NEGATIVE
Glucose, UA: NEGATIVE mg/dL
Hgb urine dipstick: NEGATIVE
Ketones, ur: NEGATIVE mg/dL
Leukocytes,Ua: NEGATIVE
Nitrite: NEGATIVE
Protein, ur: NEGATIVE mg/dL
Specific Gravity, Urine: 1.025 (ref 1.005–1.030)
pH: 6 (ref 5.0–8.0)

## 2023-09-02 LAB — CBC WITH DIFFERENTIAL/PLATELET
Abs Immature Granulocytes: 0.02 10*3/uL (ref 0.00–0.07)
Basophils Absolute: 0 10*3/uL (ref 0.0–0.1)
Basophils Relative: 0 %
Eosinophils Absolute: 0.1 10*3/uL (ref 0.0–0.5)
Eosinophils Relative: 2 %
HCT: 38.7 % (ref 36.0–46.0)
Hemoglobin: 13 g/dL (ref 12.0–15.0)
Immature Granulocytes: 0 %
Lymphocytes Relative: 34 %
Lymphs Abs: 2.6 10*3/uL (ref 0.7–4.0)
MCH: 29.7 pg (ref 26.0–34.0)
MCHC: 33.6 g/dL (ref 30.0–36.0)
MCV: 88.6 fL (ref 80.0–100.0)
Monocytes Absolute: 0.5 10*3/uL (ref 0.1–1.0)
Monocytes Relative: 7 %
Neutro Abs: 4.4 10*3/uL (ref 1.7–7.7)
Neutrophils Relative %: 57 %
Platelets: 342 10*3/uL (ref 150–400)
RBC: 4.37 MIL/uL (ref 3.87–5.11)
RDW: 12.2 % (ref 11.5–15.5)
WBC: 7.7 10*3/uL (ref 4.0–10.5)
nRBC: 0 % (ref 0.0–0.2)

## 2023-09-02 LAB — POC URINE PREG, ED: Preg Test, Ur: NEGATIVE

## 2023-09-02 LAB — URINALYSIS, ROUTINE W REFLEX MICROSCOPIC
Bilirubin Urine: NEGATIVE
Glucose, UA: NEGATIVE mg/dL
Hgb urine dipstick: NEGATIVE
Ketones, ur: NEGATIVE mg/dL
Leukocytes,Ua: NEGATIVE
Nitrite: NEGATIVE
Protein, ur: NEGATIVE mg/dL
Specific Gravity, Urine: 1.019 (ref 1.005–1.030)
pH: 6 (ref 5.0–8.0)

## 2023-09-02 LAB — PREGNANCY, URINE: Preg Test, Ur: NEGATIVE

## 2023-09-02 LAB — COMPREHENSIVE METABOLIC PANEL
ALT: 23 U/L (ref 0–44)
AST: 23 U/L (ref 15–41)
Albumin: 4 g/dL (ref 3.5–5.0)
Alkaline Phosphatase: 40 U/L (ref 38–126)
Anion gap: 8 (ref 5–15)
BUN: 14 mg/dL (ref 6–20)
CO2: 23 mmol/L (ref 22–32)
Calcium: 9.1 mg/dL (ref 8.9–10.3)
Chloride: 106 mmol/L (ref 98–111)
Creatinine, Ser: 0.74 mg/dL (ref 0.44–1.00)
GFR, Estimated: >60 mL/min (ref >60)
Glucose, Bld: 102 mg/dL — ABNORMAL HIGH (ref 70–99)
Potassium: 3.9 mmol/L (ref 3.5–5.1)
Sodium: 137 mmol/L (ref 135–145)
Total Bilirubin: 0.8 mg/dL (ref 0.0–1.2)
Total Protein: 7 g/dL (ref 6.5–8.1)

## 2023-09-02 LAB — LIPASE, BLOOD: Lipase: 32 U/L (ref 11–51)

## 2023-09-02 MED ORDER — IOHEXOL 300 MG/ML  SOLN
100.0000 mL | Freq: Once | INTRAMUSCULAR | Status: AC | PRN
  Administered 2023-09-02: 100 mL via INTRAVENOUS

## 2023-09-02 MED ORDER — ACETAMINOPHEN 325 MG PO TABS
650.0000 mg | ORAL_TABLET | Freq: Once | ORAL | Status: AC
  Administered 2023-09-02: 650 mg via ORAL
  Filled 2023-09-02: qty 2

## 2023-09-02 NOTE — ED Notes (Signed)
 Pt to [place].

## 2023-09-02 NOTE — ED Provider Notes (Signed)
 [facility]    CSN: [id number] Arrival date & time: 09/02/23  6295      History   Chief Complaint Chief Complaint  Patient presents with   Abdominal Pain    HPI Misty Peters is a 28 y.o. female presents for abdominal pain.  Patient reports this morning she woke with a left lower quadrant abdominal pain that is beginning to radiate to her right lower quadrant.  She denies any fevers, nausea/vomiting.  States she had a normal BM this morning but when she arrived to the clinic she did have 1 episode of nonbloody diarrhea.  Denies any dysuria, vaginal discharge, flank pain or low back pain.  Denies any history of GI diagnoses such as Crohn's, IBS, colitis, ovarian cyst.  States she has had similar symptoms in the past with a negative workup.  No history of GI surgeries.  Cannot identify any aggravating or alleviating factors.  No other concerns at this time.   Abdominal Pain   Past Medical History:  Diagnosis Date   Anxiety    Bipolar disorder with depression (HCC) 11/07/2015   Depression    Headache     Patient Active Problem List   Diagnosis Date Noted   History of suicide attempt 07/08/2022   Kidney stone 07/08/2022   IFG (impaired fasting glucose) 07/08/2022   Gastroenteritis 07/08/2022   Bipolar disorder, in full remission, most recent episode depressed (HCC) 08/07/2020   High risk medication use 11/17/2019   MDD (major depressive disorder) 08/13/2019   PTSD (post-traumatic stress disorder) 08/13/2019   Migraine 03/13/2018   Suicidal ideations 02/08/2018   Encounter for BCP (birth control pills) initial prescription 11/15/2016   Anxiety     Past Surgical History:  Procedure Laterality Date   GUM SURGERY     SALIVARY STONE REMOVAL     WISDOM TOOTH EXTRACTION      OB History   No obstetric history on file.      Home Medications    Prior to Admission medications   Not on File    Family History Family History  Problem Relation Age of  Onset   Hyperlipidemia Father    Hypertension Father    Diabetes Father    Heart disease Paternal Grandmother    Stroke Paternal Grandmother    Cancer Paternal Grandmother        lung and breast   Bipolar disorder Paternal Grandmother    ADD / ADHD Paternal Grandmother    Cancer Maternal Grandmother        breast   Bipolar disorder Paternal Aunt    Drug abuse Other    COPD Neg Hx     Social History Social History   Tobacco Use   Smoking status: Never    Passive exposure: Never   Smokeless tobacco: Never  Vaping Use   Vaping status: Never Used  Substance Use Topics   Alcohol use: No    Alcohol/week: 0.0 standard drinks of alcohol   Drug use: No     Allergies   Patient has no known allergies.   Review of Systems Review of Systems  Gastrointestinal:  Positive for abdominal pain.     Physical Exam Triage Vital Signs ED Triage Vitals  Encounter Vitals Group     BP 09/02/23 0935 121/86     Systolic BP Percentile --      Diastolic BP Percentile --      Pulse Rate 09/02/23 0935 78     Resp 09/02/23 0935  18     Temp 09/02/23 0935 98 F (36.7 C)     Temp Source 09/02/23 0935 Oral     SpO2 09/02/23 0935 98 %     Weight --      Height --      Head Circumference --      Peak Flow --      Pain Score 09/02/23 0933 3     Pain Loc --      Pain Education --      Exclude from Growth Chart --    No data found.  Updated Vital Signs BP 121/86 (BP Location: Right Arm)   Pulse 78   Temp 98 F (36.7 C) (Oral)   Resp 18   LMP 08/19/2023 (Approximate)   SpO2 98%   Visual Acuity Right Eye Distance:   Left Eye Distance:   Bilateral Distance:    Right Eye Near:   Left Eye Near:    Bilateral Near:     Physical Exam Vitals and nursing note reviewed.  Constitutional:      Appearance: Normal appearance.  HENT:     Head: Normocephalic and atraumatic.  Eyes:     Pupils: Pupils are equal, round, and reactive to light.  Cardiovascular:     Rate and Rhythm:  Normal rate.  Pulmonary:     Effort: Pulmonary effort is normal.  Abdominal:     General: Abdomen is flat. Bowel sounds are normal.     Palpations: Abdomen is soft. There is no hepatomegaly or splenomegaly.     Tenderness: There is abdominal tenderness in the right lower quadrant and left lower quadrant. Negative signs include Rovsing's sign and McBurney's sign.  Skin:    General: Skin is warm and dry.  Neurological:     General: No focal deficit present.     Mental Status: She is alert and oriented to person, place, and time.  Psychiatric:        Mood and Affect: Mood normal.        Behavior: Behavior normal.      UC Treatments / Results  Labs (all labs ordered are listed, but only abnormal results are displayed) Labs Reviewed  URINALYSIS, W/ REFLEX TO CULTURE (INFECTION SUSPECTED) - Abnormal; Notable for the following components:      Result Value   Bacteria, UA FEW (*)    All other components within normal limits  PREGNANCY, URINE    EKG   Radiology No results found.  Procedures Procedures (including critical care time)  Medications Ordered in UC Medications - No data to display  Initial Impression / Assessment and Plan / UC Course  I have reviewed the triage vital signs and the nursing notes.  Pertinent labs & imaging results that were available during my care of the patient were reviewed by me and considered in my medical decision making (see chart for details).     Reviewed exam and sx with patient. Pt presents with acute LLQ abd pain. UA and HCG negative. Pt with tenderness on exam.  Discussed limitations and abilities of urgent care.  Given her exam and symptoms I did advise she go to the emergency room for further workup.  She is in agreement with plan will go POV to the emergency room. Final Clinical Impressions(s) / UC Diagnoses   Final diagnoses:  Left lower quadrant abdominal pain     Discharge Instructions      Please go to the ER for further  evaluation of your  symptoms    ED Prescriptions   None    PDMP not reviewed this encounter.   Radford Pax, NP 09/02/23 1017

## 2023-09-02 NOTE — Discharge Instructions (Addendum)
 You were seen in the Emergency Department today for evaluation of your abdominal pain. Fortunately, your labs, urine test, ultrasound and CT scan were overall reassuring against an emergency cause for your pain. Please follow-up with your primary care doctor for further evaluation.  I also included information for follow-up with GI. Return to the ER for any new or worsening symptoms including worsening pain, inability to tolerate food or liquids, or any other new or concerning symptoms.

## 2023-09-02 NOTE — ED Provider Notes (Signed)
 [facility] Provider Note    Event Date/Time   First MD Initiated Contact with Patient 09/02/23 1115     (approximate)   History   Pelvic Pain   HPI  Bekah Igoe is a 28 y.o. female with PMH of PMH of bipolar disorder, anxiety and depression presents for evaluation of lower abdominal pain.  Patient states that the pain began this morning.  It is mostly in the left lower quadrant but does have some radiation over to the right side.  She says that the pain waxes and wanes.  It is a sharp pain that lasts for about 10 seconds to a minute long and then decreases after that to an achy feeling.  She denies associated symptoms of nausea, vomiting, fever, urinary symptoms, vaginal discharge or vaginal bleeding.  She states she had 1 normal bowel movement this morning when she woke up and then had diarrhea after that.  She presented to the urgent care this morning who recommended that they come to the emergency department for further workup.  She has not had any abdominal surgeries.  Denies history of inflammatory and irritable bowel disease.  No history of ovarian cysts.      Physical Exam   Triage Vital Signs: ED Triage Vitals  Encounter Vitals Group     BP 09/02/23 1047 111/84     Systolic BP Percentile --      Diastolic BP Percentile --      Pulse Rate 09/02/23 1047 66     Resp 09/02/23 1047 18     Temp 09/02/23 1047 98.1 F (36.7 C)     Temp Source 09/02/23 1047 Oral     SpO2 09/02/23 1047 99 %     Weight --      Height --      Head Circumference --      Peak Flow --      Pain Score 09/02/23 1052 6     Pain Loc --      Pain Education --      Exclude from Growth Chart --     Most recent vital signs: Vitals:   09/02/23 1540 09/02/23 1859  BP:  98/72  Pulse:  87  Resp:  17  Temp: 98.7 F (37.1 C)   SpO2:  99%   General: Awake, no distress.  Well-appearing. CV:  Good peripheral perfusion.  RRR. Resp:  Normal effort.  CTAB. Abd:  No  distention.  Soft, bowel sounds appropriate, tender to palpation over the umbilicus, left lower quadrant and right lower quadrant. Other:     ED Results / Procedures / Treatments   Labs (all labs ordered are listed, but only abnormal results are displayed) Labs Reviewed  COMPREHENSIVE METABOLIC PANEL - Abnormal; Notable for the following components:      Result Value   Glucose, Bld 102 (*)    All other components within normal limits  URINALYSIS, ROUTINE W REFLEX MICROSCOPIC - Abnormal; Notable for the following components:   Color, Urine YELLOW (*)    APPearance CLEAR (*)    All other components within normal limits  LIPASE, BLOOD  CBC WITH DIFFERENTIAL/PLATELET  POC URINE PREG, ED    RADIOLOGY  Pelvic ultrasound and CT abdomen pelvis obtained.  I interpreted the images as well as reviewed the radiologist report.  Pelvic ultrasound was negative for any acute abnormalities including ovarian torsion and ovarian cysts.  CT abdomen imaging is still pending but upon my review I  do not see any acute abnormalities.   PROCEDURES:  Critical Care performed: No  Procedures   MEDICATIONS ORDERED IN ED: Medications  acetaminophen (TYLENOL) tablet 650 mg (650 mg Oral Given 09/02/23 1649)  iohexol (OMNIPAQUE) 300 MG/ML solution 100 mL (100 mLs Intravenous Contrast Given 09/02/23 1655)     IMPRESSION / MDM / ASSESSMENT AND PLAN / ED COURSE  I reviewed the triage vital signs and the nursing notes.                             28 year old female presents for evaluation of lower abdominal pain.  Vital signs are stable and patient NAD on exam.  Differential diagnosis includes, but is not limited to, ovarian torsion, ovarian cyst, diverticulitis, appendicitis, inflammatory bowel, viral gastroenteritis.  Patient's presentation is most consistent with acute complicated illness / injury requiring diagnostic workup.  CBC, CMP and lipase within normal limits.  Urinalysis without signs of  infection.  Pregnancy test is negative.  Given the reassuring lab work and the patient's location of her pain being mostly in the left lower quadrant discussed getting CT scan versus ultrasound.  Decided to start with an ultrasound to avoid radiation in an otherwise healthy young adult.  I am most concerned about ovarian cyst and ovarian torsion versus possible diverticulitis/IBD/IBS.  Given that vitals and labs are reassuring I feel less likely that this is diverticulitis with complications like abscess or bowel perforation.  Pelvic ultrasound was normal.  Discussed getting CT scan with patient again.  She reports that she has had episodes of this pain for a while and would really like to get some answers so she would like to proceed with a CT scan.  Given she is tender on exam I did feel that this was reasonable.  I did explain that we may not find the exact cause of her pain at this time.  She voiced understanding with this.  At the time of shift change I am still waiting for the radiology read of the CT scan.  Patient's care will be passed off to Dr. Rosalia Hammers.  If CT scan comes back without abnormalities I would recommend follow-up with GI.     FINAL CLINICAL IMPRESSION(S) / ED DIAGNOSES   Final diagnoses:  Left lower quadrant abdominal pain     Rx / DC Orders   ED Discharge Orders     None        Note:  This document was prepared using Dragon voice recognition software and may include unintentional dictation errors.   Cameron Ali, PA-C 09/02/23 1947    Trinna Post, MD 09/02/23 2003

## 2023-09-02 NOTE — ED Triage Notes (Signed)
 Patient states left lower pelvic pain that started this morning; denies N/V/D and urinary symptoms.

## 2023-09-02 NOTE — Discharge Instructions (Signed)
 Please go to the ER for further evaluation of your symptoms

## 2023-09-02 NOTE — ED Triage Notes (Signed)
 Patient presents with c/o LLQ discomfort that started today. Denies urinary symptoms,nausea/vomiting, constipation, or diarrhea.

## 2023-09-08 ENCOUNTER — Ambulatory Visit: Admitting: Nurse Practitioner

## 2023-09-08 VITALS — BP 94/68 | HR 84 | Ht 62.0 in | Wt 168.0 lb

## 2023-09-08 DIAGNOSIS — R1032 Left lower quadrant pain: Secondary | ICD-10-CM

## 2023-09-08 NOTE — Progress Notes (Signed)
 BP 94/68 (BP Location: Left Arm, Patient Position: Sitting, Cuff Size: Large)   Pulse 84   Ht 5\' 2"  (1.575 m)   Wt 168 lb (76.2 kg)   LMP 08/19/2023 (Approximate)   SpO2 95%   BMI 30.73 kg/m    Subjective:    Patient ID: Elecia Serafin, female    DOB: [birth year], 28 y.o.   MRN: [id number]  HPI: Melinda Gwinner is a 28 y.o. female  Chief Complaint  Patient presents with   GI Problem    Had a referral from ER but the gastroenterologist stated that she needed to have a referral from the PCP first    ABDOMINAL CONCERNS Patient states she was seen in the ER recently and referred to [facility]. She needed the referral from her PCP.  She has had a consistent aching in her LLQ.  She went to use the bathroom and the pain was so bad it almost made her pass out.  She can sometimes she goes 2-3 days between bowel movements.  The pain comes and goes.  She feels like she always has the urge to go but then doesn't.  When she does have a bowel movement feels like it isn't completely emptying.    Relevant past medical, surgical, family and social history reviewed and updated as indicated. Interim medical history since our last visit reviewed. Allergies and medications reviewed and updated.  Review of Systems  Gastrointestinal:  Positive for abdominal pain and constipation.    Per HPI unless specifically indicated above     Objective:    BP 94/68 (BP Location: Left Arm, Patient Position: Sitting, Cuff Size: Large)   Pulse 84   Ht 5\' 2"  (1.575 m)   Wt 168 lb (76.2 kg)   LMP 08/19/2023 (Approximate)   SpO2 95%   BMI 30.73 kg/m   Wt Readings from Last 3 Encounters:  09/08/23 168 lb (76.2 kg)  02/27/23 160 lb 4.4 oz (72.7 kg)  12/10/22 160 lb 3.2 oz (72.7 kg)    Physical Exam Vitals and nursing note reviewed.  Constitutional:      General: She is not in acute distress.    Appearance: Normal appearance. She is normal weight. She is not ill-appearing, toxic-appearing or diaphoretic.   HENT:     Head: Normocephalic.     Right Ear: External ear normal.     Left Ear: External ear normal.     Nose: Nose normal.     Mouth/Throat:     Mouth: Mucous membranes are moist.     Pharynx: Oropharynx is clear.  Eyes:     General:        Right eye: No discharge.        Left eye: No discharge.     Extraocular Movements: Extraocular movements intact.     Conjunctiva/sclera: Conjunctivae normal.     Pupils: Pupils are equal, round, and reactive to light.  Cardiovascular:     Rate and Rhythm: Normal rate and regular rhythm.     Heart sounds: No murmur heard. Pulmonary:     Effort: Pulmonary effort is normal. No respiratory distress.     Breath sounds: Normal breath sounds. No wheezing or rales.  Musculoskeletal:     Cervical back: Normal range of motion and neck supple.  Skin:    General: Skin is warm and dry.     Capillary Refill: Capillary refill takes less than 2 seconds.  Neurological:     General: No focal deficit present.  Mental Status: She is alert and oriented to person, place, and time. Mental status is at baseline.  Psychiatric:        Mood and Affect: Mood normal.        Behavior: Behavior normal.        Thought Content: Thought content normal.        Judgment: Judgment normal.     Results for orders placed or performed during the hospital encounter of 09/02/23  POC urine preg, ED   Collection Time: 09/02/23 10:54 AM  Result Value Ref Range   Preg Test, Ur Negative Negative  Comprehensive metabolic panel   Collection Time: 09/02/23 10:55 AM  Result Value Ref Range   Sodium 137 135 - 145 mmol/L   Potassium 3.9 3.5 - 5.1 mmol/L   Chloride 106 98 - 111 mmol/L   CO2 23 22 - 32 mmol/L   Glucose, Bld 102 (H) 70 - 99 mg/dL   BUN 14 6 - 20 mg/dL   Creatinine, Ser 7.82 0.44 - 1.00 mg/dL   Calcium 9.1 8.9 - 95.6 mg/dL   Total Protein 7.0 6.5 - 8.1 g/dL   Albumin 4.0 3.5 - 5.0 g/dL   AST 23 15 - 41 U/L   ALT 23 0 - 44 U/L   Alkaline Phosphatase 40 38 -  126 U/L   Total Bilirubin 0.8 0.0 - 1.2 mg/dL   GFR, Estimated >21 >30 mL/min   Anion gap 8 5 - 15  Lipase, blood   Collection Time: 09/02/23 10:55 AM  Result Value Ref Range   Lipase 32 11 - 51 U/L  CBC with Diff   Collection Time: 09/02/23 10:55 AM  Result Value Ref Range   WBC 7.7 4.0 - 10.5 K/uL   RBC 4.37 3.87 - 5.11 MIL/uL   Hemoglobin 13.0 12.0 - 15.0 g/dL   HCT 86.5 78.4 - 69.6 %   MCV 88.6 80.0 - 100.0 fL   MCH 29.7 26.0 - 34.0 pg   MCHC 33.6 30.0 - 36.0 g/dL   RDW 29.5 28.4 - 13.2 %   Platelets 342 150 - 400 K/uL   nRBC 0.0 0.0 - 0.2 %   Neutrophils Relative % 57 %   Neutro Abs 4.4 1.7 - 7.7 K/uL   Lymphocytes Relative 34 %   Lymphs Abs 2.6 0.7 - 4.0 K/uL   Monocytes Relative 7 %   Monocytes Absolute 0.5 0.1 - 1.0 K/uL   Eosinophils Relative 2 %   Eosinophils Absolute 0.1 0.0 - 0.5 K/uL   Basophils Relative 0 %   Basophils Absolute 0.0 0.0 - 0.1 K/uL   Immature Granulocytes 0 %   Abs Immature Granulocytes 0.02 0.00 - 0.07 K/uL  Urinalysis, Routine w reflex microscopic -Urine, Clean Catch   Collection Time: 09/02/23 10:55 AM  Result Value Ref Range   Color, Urine YELLOW (A) YELLOW   APPearance CLEAR (A) CLEAR   Specific Gravity, Urine 1.019 1.005 - 1.030   pH 6.0 5.0 - 8.0   Glucose, UA NEGATIVE NEGATIVE mg/dL   Hgb urine dipstick NEGATIVE NEGATIVE   Bilirubin Urine NEGATIVE NEGATIVE   Ketones, ur NEGATIVE NEGATIVE mg/dL   Protein, ur NEGATIVE NEGATIVE mg/dL   Nitrite NEGATIVE NEGATIVE   Leukocytes,Ua NEGATIVE NEGATIVE      Assessment & Plan:   Problem List Items Addressed This Visit   None Visit Diagnoses       LLQ abdominal pain    -  Primary   Ongoing x several months.  Has worsened recently.  Imaging reviewed from ER and unremarkable.  Referral placed for GI.   Relevant Orders   Ambulatory referral to Gastroenterology        Follow up plan: No follow-ups on file.

## 2024-04-07 ENCOUNTER — Telehealth: Admitting: Physician Assistant

## 2024-04-07 DIAGNOSIS — J069 Acute upper respiratory infection, unspecified: Secondary | ICD-10-CM | POA: Diagnosis not present

## 2024-04-07 MED ORDER — FLUTICASONE PROPIONATE 50 MCG/ACT NA SUSP: 2.0000 | Freq: Every day | NASAL | 6 refills | Status: DC

## 2024-04-07 MED ORDER — BENZONATATE 100 MG PO CAPS: 100.0000 mg | ORAL_CAPSULE | Freq: Two times a day (BID) | ORAL | 0 refills | Status: AC | PRN

## 2024-04-07 MED ORDER — LIDOCAINE VISCOUS HCL 2 % MT SOLN: 15.0000 mL | OROMUCOSAL | 0 refills | Status: AC | PRN

## 2024-04-07 NOTE — Patient Instructions (Signed)
  Therisa Earnie Blacker, thank you for joining Teena Shuck, PA-C for today's virtual visit.  While this provider is not your primary care provider (PCP), if your PCP is located in our provider database this encounter information will be shared with them immediately following your visit.   A [facility] MyChart account gives you access to today's visit and all your visits, tests, and labs performed at [facility]  click here if you don't have a [facility] MyChart account or go to [email or web address]  Consent: (Patient) Misty Peters provided verbal consent for this virtual visit at the beginning of the encounter.  Current Medications:  Current Outpatient Medications:    brompheniramine-pseudoephedrine-DM 30-2-10 MG/5ML syrup, Take 5 mLs by mouth 4 (four) times daily as needed for up to 5 days., Disp: 100 mL, Rfl: 0   fluticasone (FLONASE) 50 MCG/ACT nasal spray, Place 2 sprays into both nostrils daily., Disp: 16 g, Rfl: 6   lidocaine  (XYLOCAINE ) 2 % solution, Use as directed 15 mLs in the mouth or throat as needed for up to 5 days for mouth pain., Disp: 10 mL, Rfl: 0   Medications ordered in this encounter:  Meds ordered this encounter  Medications   fluticasone (FLONASE) 50 MCG/ACT nasal spray    Sig: Place 2 sprays into both nostrils daily.    Dispense:  16 g    Refill:  6    Supervising Provider:   BLAISE ALEENE KIDD [[id number]]   lidocaine  (XYLOCAINE ) 2 % solution    Sig: Use as directed 15 mLs in the mouth or throat as needed for up to 5 days for mouth pain.    Dispense:  10 mL    Refill:  0    Supervising Provider:   LAMPTEY, PHILIP O [[id number]]   brompheniramine-pseudoephedrine-DM 30-2-10 MG/5ML syrup    Sig: Take 5 mLs by mouth 4 (four) times daily as needed for up to 5 days.    Dispense:  100 mL    Refill:  0    Supervising Provider:   BLAISE ALEENE KIDD [[id number]]     *If you need refills on other medications prior to your next appointment, please contact  your pharmacy*  Follow-Up: Call back or seek an in-person evaluation if the symptoms worsen or if the condition fails to improve as anticipated.  [facility] [phone]  Other Instructions Please report to the nearest Emergency room with any worsening symptoms. Follow up with primary care provider (PCP) in 2 -3 days.    If you have been instructed to have an in-person evaluation today at a local Urgent Care facility, please use the link below. It will take you to a list of all of our available [facility], including address, phone number and hours of operation. Please do not delay care.  [facility]  If you or a family member do not have a primary care provider, use the link below to schedule a visit and establish care. When you choose a [facility] primary care physician or advanced practice provider, you gain a long-term partner in health. Find a Primary Care Provider  Learn more about [facility]'s in-office and virtual care options: [facility] - Get Care Now

## 2024-04-07 NOTE — Progress Notes (Signed)
 Virtual Visit Consent   Misty Peters, you are scheduled for a virtual visit with a [facility] provider today. Just as with appointments in the office, your consent must be obtained to participate. Your consent will be active for this visit and any virtual visit you may have with one of our providers in the next 365 days. If you have a MyChart account, a copy of this consent can be sent to you electronically.  As this is a virtual visit, video technology does not allow for your provider to perform a traditional examination. This may limit your provider's ability to fully assess your condition. If your provider identifies any concerns that need to be evaluated in person or the need to arrange testing (such as labs, EKG, etc.), we will make arrangements to do so. Although advances in technology are sophisticated, we cannot ensure that it will always work on either your end or our end. If the connection with a video visit is poor, the visit may have to be switched to a telephone visit. With either a video or telephone visit, we are not always able to ensure that we have a secure connection.  By engaging in this virtual visit, you consent to the provision of healthcare and authorize for your insurance to be billed (if applicable) for the services provided during this visit. Depending on your insurance coverage, you may receive a charge related to this service.  I need to obtain your verbal consent now. Are you willing to proceed with your visit today? Misty Peters has provided verbal consent on 04/07/2024 for a virtual visit (video or telephone). Misty Peters, [place]  Date: 04/07/2024 2:26 PM   Virtual Visit via Video Note   I, Misty Peters, connected with  Misty Peters  ([id number], 08/31/1995) on 04/07/24 at  2:30 PM EDT by a video-enabled telemedicine application and verified that I am speaking with the correct person using two identifiers.  Location: Patient: Virtual Visit Location  Patient: Home Provider: Virtual Visit Location Provider: Home Office   I discussed the limitations of evaluation and management by telemedicine and the availability of in person appointments. The patient expressed understanding and agreed to proceed.    History of Present Illness: Misty Peters is a 28 y.o. who identifies as a female who was assigned female at birth, and is being seen today for URI.  HPI: URI  This is a new problem. The current episode started in the past 7 days. The problem has been gradually worsening. There has been no fever. Associated symptoms include congestion, coughing and a sore throat. She has tried acetaminophen  for the symptoms.    Problems:  Patient Active Problem List   Diagnosis Date Noted   History of suicide attempt 07/08/2022   Kidney stone 07/08/2022   IFG (impaired fasting glucose) 07/08/2022   Gastroenteritis 07/08/2022   Bipolar disorder, in full remission, most recent episode depressed 08/07/2020   High risk medication use 11/17/2019   MDD (major depressive disorder) 08/13/2019   PTSD (post-traumatic stress disorder) 08/13/2019   Migraine 03/13/2018   Suicidal ideations 02/08/2018   Encounter for BCP (birth control pills) initial prescription 11/15/2016   Anxiety     Allergies: No Known Allergies Medications: No current outpatient medications on file.  Observations/Objective: Patient is well-developed, well-nourished in no acute distress.  Resting comfortably  at home.  Head is normocephalic, atraumatic.  No labored breathing.  Speech is clear and coherent with logical content.  Patient is alert and  oriented at baseline.    Assessment and Plan: 1. Upper respiratory tract infection, unspecified type (Primary)  Patient presenting with URI. Differentials include allergic rhinitis, COVID,  bacterial pneumonia, sinusitis. Do not suspect underlying cardiopulmonary process. I considered, but think unlikely, dangerous causes of this  patient's symptoms to include ACS, CHF or pneumonia.   Plan: reassurance, reassessment, over the counter medications, discharge with PCP follow-up  Follow Up Instructions: I discussed the assessment and treatment plan with the patient. The patient was provided an opportunity to ask questions and all were answered. The patient agreed with the plan and demonstrated an understanding of the instructions.  A copy of instructions were sent to the patient via MyChart unless otherwise noted below.    The patient was advised to call back or seek an in-person evaluation if the symptoms worsen or if the condition fails to improve as anticipated.    Misty Shuck, PA-C

## 2024-04-14 ENCOUNTER — Ambulatory Visit
Admission: RE | Admit: 2024-04-14 | Discharge: 2024-04-14 | Disposition: A | Discharge status: Home / Self Care | Source: Ambulatory Visit | Attending: Emergency Medicine | Admitting: Emergency Medicine

## 2024-04-14 VITALS — BP 115/81 | HR 94 | Temp 98.5°F | Resp 18

## 2024-04-14 DIAGNOSIS — J0140 Acute pansinusitis, unspecified: Secondary | ICD-10-CM

## 2024-04-14 MED ORDER — PREDNISONE 10 MG (21) PO TBPK: ORAL_TABLET | Freq: Every day | ORAL | 0 refills | Status: DC

## 2024-04-14 MED ORDER — AMOXICILLIN-POT CLAVULANATE 875-125 MG PO TABS: 1.0000 | ORAL_TABLET | Freq: Two times a day (BID) | ORAL | 0 refills | Status: DC

## 2024-04-14 NOTE — ED Provider Notes (Signed)
 [facility]    CSN: [id number] Arrival date & time: 04/14/24  1805      History   Chief Complaint Chief Complaint  Patient presents with   Ear Fullness    Ears throat and nose hurt - Entered by patient   Otalgia    HPI Misty Peters is a 28 y.o. female.   Patient presents for evaluation of a mild nonproductive cough, nasal congestion, bilateral ear fullness and pain, lymph node tenderness, intermittent headaches and sinus pressure throughout the face beginning 5 weeks ago.  Completed e-visit and was given nasal spray, cough medicine and viscous lidocaine  with minimal relief.  History of reoccurring sinus infections per patient.  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  Tolerable to food and liquids  Past Medical History:  Diagnosis Date   Anxiety    Bipolar disorder with depression (HCC) 11/07/2015   Depression    Headache     Patient Active Problem List   Diagnosis Date Noted   History of suicide attempt 07/08/2022   Kidney stone 07/08/2022   IFG (impaired fasting glucose) 07/08/2022   Gastroenteritis 07/08/2022   Bipolar disorder, in full remission, most recent episode depressed 08/07/2020   High risk medication use 11/17/2019   MDD (major depressive disorder) 08/13/2019   PTSD (post-traumatic stress disorder) 08/13/2019   Migraine 03/13/2018   Suicidal ideations 02/08/2018   Encounter for BCP (birth control pills) initial prescription 11/15/2016   Anxiety     Past Surgical History:  Procedure Laterality Date   GUM SURGERY     SALIVARY STONE REMOVAL     WISDOM TOOTH EXTRACTION      OB History   No obstetric history on file.      Home Medications    Prior to Admission medications   Medication Sig Start Date End Date Taking? Authorizing Provider  amoxicillin -clavulanate (AUGMENTIN ) 875-125 MG tablet Take 1 tablet by mouth every 12 (twelve) hours. 04/14/24  Yes Jo-Anne Kluth R, NP  predniSONE  (STERAPRED UNI-PAK 21 TAB) 10 MG  (21) TBPK tablet Take by mouth daily. Take 6 tabs by mouth daily  for 1 days, then 5 tabs for 1 days, then 4 tabs for 1 days, then 3 tabs for 1 days, 2 tabs for 1 days, then 1 tab by mouth daily for 1 days 04/14/24  Yes Jodeci Rini R, NP  fluticasone (FLONASE) 50 MCG/ACT nasal spray Place 2 sprays into both nostrils daily. 04/07/24   Rolan Berthold, PA-C    Family History Family History  Problem Relation Age of Onset   Hyperlipidemia Father    Hypertension Father    Diabetes Father    Heart disease Paternal Grandmother    Stroke Paternal Grandmother    Cancer Paternal Grandmother        lung and breast   Bipolar disorder Paternal Grandmother    ADD / ADHD Paternal Grandmother    Cancer Maternal Grandmother        breast   Bipolar disorder Paternal Aunt    Drug abuse Other    COPD Neg Hx     Social History Social History   Tobacco Use   Smoking status: Never    Passive exposure: Never   Smokeless tobacco: Never  Vaping Use   Vaping status: Never Used  Substance Use Topics   Alcohol use: No    Alcohol/week: 0.0 standard drinks of alcohol   Drug use: No     Allergies   Patient has no  known allergies.   Review of Systems Review of Systems   Physical Exam Triage Vital Signs ED Triage Vitals  Encounter Vitals Group     BP 04/14/24 1817 115/81     Girls Systolic BP Percentile --      Girls Diastolic BP Percentile --      Boys Systolic BP Percentile --      Boys Diastolic BP Percentile --      Pulse Rate 04/14/24 1817 94     Resp 04/14/24 1817 18     Temp 04/14/24 1817 98.5 F (36.9 C)     Temp Source 04/14/24 1817 Oral     SpO2 04/14/24 1817 98 %     Weight --      Height --      Head Circumference --      Peak Flow --      Pain Score 04/14/24 1819 6     Pain Loc --      Pain Education --      Exclude from Growth Chart --    No data found.  Updated Vital Signs BP 115/81 (BP Location: Left Arm)   Pulse 94   Temp 98.5 F (36.9 C) (Oral)   Resp  18   LMP 03/24/2024 (Approximate)   SpO2 98%   Visual Acuity Right Eye Distance:   Left Eye Distance:   Bilateral Distance:    Right Eye Near:   Left Eye Near:    Bilateral Near:     Physical Exam Constitutional:      Appearance: Normal appearance.  HENT:     Head: Normocephalic.     Right Ear: Tympanic membrane, ear canal and external ear normal.     Left Ear: Tympanic membrane, ear canal and external ear normal.     Nose: Congestion present.     Mouth/Throat:     Mouth: Mucous membranes are moist.     Pharynx: Oropharynx is clear. No oropharyngeal exudate or posterior oropharyngeal erythema.  Eyes:     Extraocular Movements: Extraocular movements intact.  Pulmonary:     Effort: Pulmonary effort is normal.  Musculoskeletal:     Cervical back: Normal range of motion.  Lymphadenopathy:     Cervical: Cervical adenopathy present.  Neurological:     Mental Status: She is alert and oriented to person, place, and time. Mental status is at baseline.      UC Treatments / Results  Labs (all labs ordered are listed, but only abnormal results are displayed) Labs Reviewed - No data to display  EKG   Radiology No results found.  Procedures Procedures (including critical care time)  Medications Ordered in UC Medications - No data to display  Initial Impression / Assessment and Plan / UC Course  I have reviewed the triage vital signs and the nursing notes.  Pertinent labs & imaging results that were available during my care of the patient were reviewed by me and considered in my medical decision making (see chart for details).  Acute nonrecurrent pansinusitis  Patient is in no signs of distress nor toxic appearing.  Vital signs are stable.  Low suspicion for pneumonia, pneumothorax or bronchitis and therefore will defer imaging.  Viral testing deferred due to timeline, symptoms present for greater than 2 weeks, placed on Augmentin  as well as prescribed prednisone  for  management. May use additional over-the-counter medications as needed for supportive care.  May follow-up with urgent care as needed if symptoms persist or worsen.  \  Final Clinical Impressions(s) / UC Diagnoses   Final diagnoses:  Acute non-recurrent pansinusitis     Discharge Instructions      Begin Augmentin  twice daily for 7 days  Starting tomorrow take prednisone  every morning with food to reduce sinus pressure    You can take Tylenol  and/or Ibuprofen  as needed for fever reduction and pain relief.   For cough: honey 1/2 to 1 teaspoon (you can dilute the honey in water or another fluid).  You can also use guaifenesin and dextromethorphan for cough. You can use a humidifier for chest congestion and cough.  If you don't have a humidifier, you can sit in the bathroom with the hot shower running.      For sore throat: try warm salt water gargles, cepacol lozenges, throat spray, warm tea or water with lemon/honey, popsicles or ice, or OTC cold relief medicine for throat discomfort.   For congestion: take a daily anti-histamine like Zyrtec, Claritin, and a oral decongestant, such as pseudoephedrine.  You can also use Flonase 1-2 sprays in each nostril daily.   It is important to stay hydrated: drink plenty of fluids (water, gatorade/powerade/pedialyte, juices, or teas) to keep your throat moisturized and help further relieve irritation/discomfort.    ED Prescriptions     Medication Sig Dispense Auth. Provider   predniSONE  (STERAPRED UNI-PAK 21 TAB) 10 MG (21) TBPK tablet Take by mouth daily. Take 6 tabs by mouth daily  for 1 days, then 5 tabs for 1 days, then 4 tabs for 1 days, then 3 tabs for 1 days, 2 tabs for 1 days, then 1 tab by mouth daily for 1 days 21 tablet Ramandeep Arington R, NP   amoxicillin -clavulanate (AUGMENTIN ) 875-125 MG tablet Take 1 tablet by mouth every 12 (twelve) hours. 14 tablet Kieran Arreguin R, NP      PDMP not reviewed this encounter.   Teresa Shelba SAUNDERS,  NP 04/14/24 1827

## 2024-04-14 NOTE — ED Triage Notes (Signed)
 Patient reports bilateral ear pain and fullness x 5 weeks.  Patient has used steroid nasal spray and cough meds with mild relief. Rates pain 6/10.

## 2024-04-14 NOTE — Discharge Instructions (Signed)
 Begin Augmentin  twice daily for 7 days  Starting tomorrow take prednisone  every morning with food to reduce sinus pressure    You can take Tylenol  and/or Ibuprofen  as needed for fever reduction and pain relief.   For cough: honey 1/2 to 1 teaspoon (you can dilute the honey in water or another fluid).  You can also use guaifenesin and dextromethorphan for cough. You can use a humidifier for chest congestion and cough.  If you don't have a humidifier, you can sit in the bathroom with the hot shower running.      For sore throat: try warm salt water gargles, cepacol lozenges, throat spray, warm tea or water with lemon/honey, popsicles or ice, or OTC cold relief medicine for throat discomfort.   For congestion: take a daily anti-histamine like Zyrtec, Claritin, and a oral decongestant, such as pseudoephedrine.  You can also use Flonase 1-2 sprays in each nostril daily.   It is important to stay hydrated: drink plenty of fluids (water, gatorade/powerade/pedialyte, juices, or teas) to keep your throat moisturized and help further relieve irritation/discomfort.

## 2024-07-07 ENCOUNTER — Encounter: Payer: Self-pay | Admitting: Nurse Practitioner

## 2024-07-07 ENCOUNTER — Ambulatory Visit: Admitting: Nurse Practitioner

## 2024-07-07 VITALS — BP 117/79 | Temp 98.3°F | Ht 62.01 in | Wt 165.4 lb

## 2024-07-07 DIAGNOSIS — G43909 Migraine, unspecified, not intractable, without status migrainosus: Secondary | ICD-10-CM | POA: Diagnosis not present

## 2024-07-07 MED ORDER — NURTEC 75 MG PO TBDP: 75.0000 mg | ORAL_TABLET | Freq: Every day | ORAL | 2 refills | Status: DC | PRN

## 2024-07-07 NOTE — Progress Notes (Signed)
 "  BP 117/79 (BP Location: Right Arm, Patient Position: Sitting)   Temp 98.3 F (36.8 C) (Oral)   Ht 5' 2.01 (1.575 m)   Wt 165 lb 6.4 oz (75 kg)   LMP 06/19/2024 (Approximate)   SpO2 97%   BMI 30.24 kg/m    Subjective:    Patient ID: Misty Peters, female    DOB: 1996-05-20, 29 y.o.   MRN: [id number]  HPI: Misty Peters is a 29 y.o. female  Chief Complaint  Patient presents with   Migraine    Patient stated her migraines have gotten worse over the years. She is not taking anything for them currently   MIGRAINES Duration: 4 years- has worsened recently. Felt like it was because she needed glasses and she got them and it didn't improve Onset: gradual Severity: 8/10 Quality: sharp Frequency: intermittent Location: left side of her head Headache duration: 1-3 days Radiation: no Time of day headache occurs: mostly when she wakes Alleviating factors: laying and sleeping Aggravating factors: working, loud noises Headache status at time of visit: asymptomatic Treatments attempted: Treatments attempted: rest, ibuprofen , and aleve, excedrine   Aura: yes- sees sparkles  Nausea:  yes Vomiting: yes Photophobia:  yes Phonophobia:  yes Effect on social functioning:  yes Numbers of missed days of school/work each month: tries to push through it but makes work difficult Confusion:  yes Gait disturbance/ataxia:  no Behavioral changes:  no Fevers:  no  Relevant past medical, surgical, family and social history reviewed and updated as indicated. Interim medical history since our last visit reviewed. Allergies and medications reviewed and updated.  Review of Systems  Per HPI unless specifically indicated above     Objective:    BP 117/79 (BP Location: Right Arm, Patient Position: Sitting)   Temp 98.3 F (36.8 C) (Oral)   Ht 5' 2.01 (1.575 m)   Wt 165 lb 6.4 oz (75 kg)   LMP 06/19/2024 (Approximate)   SpO2 97%   BMI 30.24 kg/m   Wt Readings from Last 3  Encounters:  07/07/24 165 lb 6.4 oz (75 kg)  09/08/23 168 lb (76.2 kg)  02/27/23 160 lb 4.4 oz (72.7 kg)    Physical Exam Vitals and nursing note reviewed.  Constitutional:      General: She is not in acute distress.    Appearance: Normal appearance. She is normal weight. She is not ill-appearing, toxic-appearing or diaphoretic.  HENT:     Head: Normocephalic.     Right Ear: External ear normal.     Left Ear: External ear normal.     Nose: Nose normal.     Mouth/Throat:     Mouth: Mucous membranes are moist.     Pharynx: Oropharynx is clear.  Eyes:     General:        Right eye: No discharge.        Left eye: No discharge.     Extraocular Movements: Extraocular movements intact.     Conjunctiva/sclera: Conjunctivae normal.     Pupils: Pupils are equal, round, and reactive to light.  Cardiovascular:     Rate and Rhythm: Normal rate and regular rhythm.     Heart sounds: No murmur heard. Pulmonary:     Effort: Pulmonary effort is normal. No respiratory distress.     Breath sounds: Normal breath sounds. No wheezing or rales.  Musculoskeletal:     Cervical back: Normal range of motion and neck supple.  Skin:    General: Skin is  warm and dry.     Capillary Refill: Capillary refill takes less than 2 seconds.  Neurological:     General: No focal deficit present.     Mental Status: She is alert and oriented to person, place, and time. Mental status is at baseline.  Psychiatric:        Mood and Affect: Mood normal.        Behavior: Behavior normal.        Thought Content: Thought content normal.        Judgment: Judgment normal.     Results for orders placed or performed during the hospital encounter of 09/02/23  POC urine preg, ED   Collection Time: 09/02/23 10:54 AM  Result Value Ref Range   Preg Test, Ur Negative Negative  Comprehensive metabolic panel   Collection Time: 09/02/23 10:55 AM  Result Value Ref Range   Sodium 137 135 - 145 mmol/L   Potassium 3.9 3.5 - 5.1  mmol/L   Chloride 106 98 - 111 mmol/L   CO2 23 22 - 32 mmol/L   Glucose, Bld 102 (H) 70 - 99 mg/dL   BUN 14 6 - 20 mg/dL   Creatinine, Ser 9.25 0.44 - 1.00 mg/dL   Calcium 9.1 8.9 - 89.6 mg/dL   Total Protein 7.0 6.5 - 8.1 g/dL   Albumin 4.0 3.5 - 5.0 g/dL   AST 23 15 - 41 U/L   ALT 23 0 - 44 U/L   Alkaline Phosphatase 40 38 - 126 U/L   Total Bilirubin 0.8 0.0 - 1.2 mg/dL   GFR, Estimated >39 >39 mL/min   Anion gap 8 5 - 15  Lipase, blood   Collection Time: 09/02/23 10:55 AM  Result Value Ref Range   Lipase 32 11 - 51 U/L  CBC with Diff   Collection Time: 09/02/23 10:55 AM  Result Value Ref Range   WBC 7.7 4.0 - 10.5 K/uL   RBC 4.37 3.87 - 5.11 MIL/uL   Hemoglobin 13.0 12.0 - 15.0 g/dL   HCT 61.2 63.9 - 53.9 %   MCV 88.6 80.0 - 100.0 fL   MCH 29.7 26.0 - 34.0 pg   MCHC 33.6 30.0 - 36.0 g/dL   RDW 87.7 88.4 - 84.4 %   Platelets 342 150 - 400 K/uL   nRBC 0.0 0.0 - 0.2 %   Neutrophils Relative % 57 %   Neutro Abs 4.4 1.7 - 7.7 K/uL   Lymphocytes Relative 34 %   Lymphs Abs 2.6 0.7 - 4.0 K/uL   Monocytes Relative 7 %   Monocytes Absolute 0.5 0.1 - 1.0 K/uL   Eosinophils Relative 2 %   Eosinophils Absolute 0.1 0.0 - 0.5 K/uL   Basophils Relative 0 %   Basophils Absolute 0.0 0.0 - 0.1 K/uL   Immature Granulocytes 0 %   Abs Immature Granulocytes 0.02 0.00 - 0.07 K/uL  Urinalysis, Routine w reflex microscopic -Urine, Clean Catch   Collection Time: 09/02/23 10:55 AM  Result Value Ref Range   Color, Urine YELLOW (A) YELLOW   APPearance CLEAR (A) CLEAR   Specific Gravity, Urine 1.019 1.005 - 1.030   pH 6.0 5.0 - 8.0   Glucose, UA NEGATIVE NEGATIVE mg/dL   Hgb urine dipstick NEGATIVE NEGATIVE   Bilirubin Urine NEGATIVE NEGATIVE   Ketones, ur NEGATIVE NEGATIVE mg/dL   Protein, ur NEGATIVE NEGATIVE mg/dL   Nitrite NEGATIVE NEGATIVE   Leukocytes,Ua NEGATIVE NEGATIVE      Assessment & Plan:   Problem  List Items Addressed This Visit       Cardiovascular and Mediastinum    Migraine - Primary   Chronic.  Ongoing problem.  Has worsened recently.  Will start Nurtec PRN at the onset of migraines.  Side effects and benefits of medications.  If not approved by insurance will try a Triptan. Follow up in 1 month.  Call sooner if concerns arise.       Relevant Medications   Rimegepant Sulfate (NURTEC) 75 MG TBDP     Follow up plan: Return in about 1 month (around 08/07/2024) for Physical and Fasting labs.      "

## 2024-07-07 NOTE — Assessment & Plan Note (Signed)
 Chronic.  Ongoing problem.  Has worsened recently.  Will start Nurtec PRN at the onset of migraines.  Side effects and benefits of medications.  If not approved by insurance will try a Triptan. Follow up in 1 month.  Call sooner if concerns arise.

## 2024-07-07 NOTE — Patient Instructions (Signed)
Unisom 

## 2024-07-08 ENCOUNTER — Ambulatory Visit
Admission: EM | Admit: 2024-07-08 | Discharge: 2024-07-08 | Disposition: A | Discharge status: Home / Self Care | Attending: Physician Assistant | Admitting: Physician Assistant

## 2024-07-08 ENCOUNTER — Encounter: Payer: Self-pay | Admitting: Emergency Medicine

## 2024-07-08 DIAGNOSIS — J101 Influenza due to other identified influenza virus with other respiratory manifestations: Secondary | ICD-10-CM | POA: Diagnosis not present

## 2024-07-08 DIAGNOSIS — R051 Acute cough: Secondary | ICD-10-CM

## 2024-07-08 DIAGNOSIS — R509 Fever, unspecified: Secondary | ICD-10-CM

## 2024-07-08 LAB — POC COVID19/FLU A&B COMBO
Covid Antigen, POC: NEGATIVE
Influenza A Antigen, POC: POSITIVE — AB
Influenza B Antigen, POC: NEGATIVE

## 2024-07-08 MED ORDER — OSELTAMIVIR PHOSPHATE 75 MG PO CAPS: 75.0000 mg | ORAL_CAPSULE | Freq: Two times a day (BID) | ORAL | 0 refills | Status: AC

## 2024-07-08 MED ORDER — PROMETHAZINE-DM 6.25-15 MG/5ML PO SYRP: 5.0000 mL | ORAL_SOLUTION | Freq: Four times a day (QID) | ORAL | 0 refills | Status: AC | PRN

## 2024-07-08 NOTE — Discharge Instructions (Signed)
-   Flu is positive.  You are within the window for treatment Tamiflu  to potentially be helpful so I sent it to the pharmacy - Sent cough medicine and Tamiflu . - You need to isolate until you are fever free for 24 hours and symptoms are improving. - Increase rest and fluids. - You should be seen again if you have uncontrolled fever, weakness or worsening breathing problem.

## 2024-07-08 NOTE — ED Triage Notes (Signed)
 Patient c/o cough, fever 102 and bodyaches that started yesterday.

## 2024-07-08 NOTE — ED Provider Notes (Signed)
 " [facility]    CSN: [id number] Arrival date & time: 07/08/24  1208      History   Chief Complaint Chief Complaint  Patient presents with   Cough    HPI Misty Peters is a 29 y.o. female presenting for fever, fatigue, cough, congestion, mild sore throat, and body aches x 1 day. Denies ear pain, sinus pain, chest pain, wheezing, shortness of breath, abdominal pain, vomiting or diarrhea. Temps up to 102 degrees. Patient has been taking over-the-counter meds. No other complaints.   HPI  Past Medical History:  Diagnosis Date   Anxiety    Bipolar disorder with depression (HCC) 11/07/2015   Depression    Headache     Patient Active Problem List   Diagnosis Date Noted   History of suicide attempt 07/08/2022   Kidney stone 07/08/2022   IFG (impaired fasting glucose) 07/08/2022   Gastroenteritis 07/08/2022   Bipolar disorder, in full remission, most recent episode depressed 08/07/2020   High risk medication use 11/17/2019   MDD (major depressive disorder) 08/13/2019   PTSD (post-traumatic stress disorder) 08/13/2019   Migraine 03/13/2018   Suicidal ideations 02/08/2018   Encounter for BCP (birth control pills) initial prescription 11/15/2016   Anxiety     Past Surgical History:  Procedure Laterality Date   GUM SURGERY     SALIVARY STONE REMOVAL     WISDOM TOOTH EXTRACTION      OB History   No obstetric history on file.      Home Medications    Prior to Admission medications  Medication Sig Start Date End Date Taking? Authorizing Provider  oseltamivir  (TAMIFLU ) 75 MG capsule Take 1 capsule (75 mg total) by mouth every 12 (twelve) hours for 5 days. 07/08/24 07/13/24 Yes Arvis Jolan NOVAK, PA-C  promethazine -dextromethorphan (PROMETHAZINE -DM) 6.25-15 MG/5ML syrup Take 5 mLs by mouth 4 (four) times daily as needed. 07/08/24  Yes Arvis Jolan NOVAK, PA-C  Rimegepant Sulfate (NURTEC) 75 MG TBDP Take 1 tablet (75 mg total) by mouth daily as needed (Migraines).  07/07/24   Melvin Pao, NP    Family History Family History  Problem Relation Age of Onset   Hyperlipidemia Father    Hypertension Father    Diabetes Father    Heart disease Paternal Grandmother    Stroke Paternal Grandmother    Cancer Paternal Grandmother        lung and breast   Bipolar disorder Paternal Grandmother    ADD / ADHD Paternal Grandmother    Cancer Maternal Grandmother        breast   Bipolar disorder Paternal Aunt    Drug abuse Other    COPD Neg Hx     Social History Social History[1]   Allergies   Patient has no known allergies.   Review of Systems Review of Systems  Constitutional:  Positive for fatigue and fever. Negative for chills and diaphoresis.  HENT:  Positive for congestion, rhinorrhea and sore throat. Negative for ear pain, sinus pressure and sinus pain.   Respiratory:  Positive for cough. Negative for shortness of breath.   Cardiovascular:  Negative for chest pain.  Gastrointestinal:  Negative for abdominal pain, nausea and vomiting.  Musculoskeletal:  Positive for myalgias.  Skin:  Negative for rash.  Neurological:  Negative for weakness and headaches.  Hematological:  Negative for adenopathy.     Physical Exam Triage Vital Signs ED Triage Vitals  Encounter Vitals Group     BP  Girls Systolic BP Percentile      Girls Diastolic BP Percentile      Boys Systolic BP Percentile      Boys Diastolic BP Percentile      Pulse      Resp      Temp      Temp src      SpO2      Weight      Height      Head Circumference      Peak Flow      Pain Score      Pain Loc      Pain Education      Exclude from Growth Chart    No data found.  Updated Vital Signs BP 113/75 (BP Location: Right Arm)   Pulse (!) 114   Temp 98.7 F (37.1 C) (Oral)   Resp 15   Wt 164 lb (74.4 kg)   LMP 06/19/2024 (Approximate)   SpO2 98%   BMI 29.99 kg/m    Physical Exam Vitals and nursing note reviewed.  Constitutional:      General: She is  not in acute distress.    Appearance: Normal appearance. She is ill-appearing. She is not toxic-appearing.  HENT:     Head: Normocephalic and atraumatic.     Nose: Congestion present.     Mouth/Throat:     Mouth: Mucous membranes are moist.     Pharynx: Oropharynx is clear. No posterior oropharyngeal erythema.  Eyes:     General: No scleral icterus.       Right eye: No discharge.        Left eye: No discharge.     Conjunctiva/sclera: Conjunctivae normal.  Cardiovascular:     Rate and Rhythm: Regular rhythm. Tachycardia present.     Heart sounds: Normal heart sounds.  Pulmonary:     Effort: Pulmonary effort is normal. No respiratory distress.     Breath sounds: Normal breath sounds.  Musculoskeletal:     Cervical back: Neck supple.  Skin:    General: Skin is dry.  Neurological:     General: No focal deficit present.     Mental Status: She is alert. Mental status is at baseline.     Motor: No weakness.     Gait: Gait normal.  Psychiatric:        Mood and Affect: Mood normal.        Behavior: Behavior normal.      UC Treatments / Results  Labs (all labs ordered are listed, but only abnormal results are displayed) Labs Reviewed  POC COVID19/FLU A&B COMBO - Abnormal; Notable for the following components:      Result Value   Influenza A Antigen, POC Positive (*)    All other components within normal limits    EKG   Radiology No results found.  Procedures Procedures (including critical care time)  Medications Ordered in UC Medications - No data to display  Initial Impression / Assessment and Plan / UC Course  I have reviewed the triage vital signs and the nursing notes.  Pertinent labs & imaging results that were available during my care of the patient were reviewed by me and considered in my medical decision making (see chart for details).   29 year old female presents for onset of fever, fatigue, cough, congestion and bodyaches yesterday.  Temps up to 102  degrees.  Currently afebrile.  Pulse elevated 114 bpm.  Ill-appearing but nontoxic.  On exam has nasal congestion.  Throat  clear.  Chest clear.  Heart regular rhythm.  COVID/flu testing obtained.  Positive flu A.  Reviewed results patient.  Sent Tamiflu  and Promethazine  DM.  Reviewed current CDC guidelines, isolation protocol, and ED precautions.  Work note was given.  Acute illness with systemic symptoms.   Final Clinical Impressions(s) / UC Diagnoses   Final diagnoses:  Acute cough  Influenza A  Fever, unspecified     Discharge Instructions      - Flu is positive.  You are within the window for treatment Tamiflu  to potentially be helpful so I sent it to the pharmacy  - Sent cough medicine and Tamiflu . - You need to isolate until you are fever free for 24 hours and symptoms are improving. - Increase rest and fluids. - You should be seen again if you have uncontrolled fever, weakness or worsening breathing problem.     ED Prescriptions     Medication Sig Dispense Auth. Provider   oseltamivir  (TAMIFLU ) 75 MG capsule Take 1 capsule (75 mg total) by mouth every 12 (twelve) hours for 5 days. 10 capsule Arvis Huxley B, PA-C   promethazine -dextromethorphan (PROMETHAZINE -DM) 6.25-15 MG/5ML syrup Take 5 mLs by mouth 4 (four) times daily as needed. 118 mL Arvis Huxley NOVAK, PA-C      PDMP not reviewed this encounter.     [1]  Social History Tobacco Use   Smoking status: Never    Passive exposure: Never   Smokeless tobacco: Never  Vaping Use   Vaping status: Never Used  Substance Use Topics   Alcohol use: No    Alcohol/week: 0.0 standard drinks of alcohol   Drug use: No     Arvis Huxley NOVAK, PA-C 07/08/24 1330  "

## 2024-07-11 ENCOUNTER — Telehealth: Payer: Self-pay

## 2024-07-11 NOTE — Telephone Encounter (Signed)
 Pharmacy Patient Advocate Encounter   Received notification from [facility] KEY that prior authorization for Nurtec 75MG  dispersible tablets is required/requested.   Insurance verification completed.   The patient is insured through [facility].   Per test claim: PA required; PA started via CoverMyMeds. KEY [id number] . Please see clinical question(s) below that I am not finding the answer to in their chart and advise.  Has the patient experienced an inadequate treatment response or an intolerance to TWO triptan 5-HT1 receptor agonists?

## 2024-07-11 NOTE — Telephone Encounter (Signed)
 No she hasn't

## 2024-07-12 MED ORDER — SUMATRIPTAN SUCCINATE 50 MG PO TABS: 50.0000 mg | ORAL_TABLET | ORAL | 0 refills | Status: AC | PRN

## 2024-07-12 NOTE — Telephone Encounter (Signed)
 I called the patient and left a detailed VM on her cell (Per her flags). If she calls back in with questions or concerns she can speak with [id number].

## 2024-07-12 NOTE — Telephone Encounter (Signed)
 Patient will need to meet criteria for insurance to cover. She needs to try at least two triptans before they can get approved.

## 2024-07-12 NOTE — Telephone Encounter (Signed)
 Please let patient know that I sent in the sumatriptan  for her.  Her insurance is going to make her fail two medications before we can try to nurtec.

## 2024-07-12 NOTE — Addendum Note (Signed)
 Addended by: MELVIN PAO on: 07/12/2024 02:13 PM   Modules accepted: Orders

## 2024-08-07 ENCOUNTER — Encounter: Admitting: Nurse Practitioner
